# Patient Record
Sex: Male | Born: 1950
Health system: Southern US, Community
[De-identification: ages and names within clinical notes are randomized; demographics above are authoritative.]

## PROBLEM LIST (undated history)

## (undated) DIAGNOSIS — I219 Acute myocardial infarction, unspecified: Secondary | ICD-10-CM

## (undated) DIAGNOSIS — K219 Gastro-esophageal reflux disease without esophagitis: Secondary | ICD-10-CM

## (undated) DIAGNOSIS — N200 Calculus of kidney: Secondary | ICD-10-CM

## (undated) DIAGNOSIS — F419 Anxiety disorder, unspecified: Secondary | ICD-10-CM

## (undated) DIAGNOSIS — J939 Pneumothorax, unspecified: Secondary | ICD-10-CM

## (undated) DIAGNOSIS — B029 Zoster without complications: Secondary | ICD-10-CM

## (undated) DIAGNOSIS — I493 Ventricular premature depolarization: Secondary | ICD-10-CM

## (undated) DIAGNOSIS — G500 Trigeminal neuralgia: Secondary | ICD-10-CM

## (undated) DIAGNOSIS — E7800 Pure hypercholesterolemia, unspecified: Secondary | ICD-10-CM

## (undated) DIAGNOSIS — I2510 Atherosclerotic heart disease of native coronary artery without angina pectoris: Secondary | ICD-10-CM

## (undated) HISTORY — DX: Pure hypercholesterolemia, unspecified: E78.00

## (undated) HISTORY — PX: NECK SURGERY: SHX720

## (undated) HISTORY — DX: Calculus of kidney: N20.0

## (undated) HISTORY — PX: CORONARY ARTERY BYPASS GRAFT: SHX141

## (undated) HISTORY — DX: Zoster without complications: B02.9

## (undated) HISTORY — DX: Anxiety disorder, unspecified: F41.9

## (undated) HISTORY — PX: CERVICAL DISC SURGERY: SHX588

## (undated) HISTORY — DX: Atherosclerotic heart disease of native coronary artery without angina pectoris: I25.10

## (undated) HISTORY — DX: Ventricular premature depolarization: I49.3

## (undated) HISTORY — DX: Trigeminal neuralgia: G50.0

## (undated) HISTORY — DX: Acute myocardial infarction, unspecified: I21.9

## (undated) HISTORY — PX: KNEE ARTHROSCOPY: SUR90

## (undated) HISTORY — DX: Gastro-esophageal reflux disease without esophagitis: K21.9

---
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
## 1997-12-13 ENCOUNTER — Emergency Department (HOSPITAL_COMMUNITY): Admission: EM | Admit: 1997-12-13 | Discharge: 1997-12-13 | Payer: Self-pay | Admitting: Emergency Medicine

## 1998-07-28 ENCOUNTER — Emergency Department (HOSPITAL_COMMUNITY): Admission: EM | Admit: 1998-07-28 | Discharge: 1998-07-28 | Payer: Self-pay | Admitting: Emergency Medicine

## 1998-07-28 ENCOUNTER — Encounter: Payer: Self-pay | Admitting: Diagnostic Radiology

## 1999-09-17 ENCOUNTER — Ambulatory Visit (HOSPITAL_COMMUNITY): Admission: RE | Admit: 1999-09-17 | Discharge: 1999-09-17 | Payer: Self-pay | Admitting: Gastroenterology

## 1999-09-18 ENCOUNTER — Ambulatory Visit (HOSPITAL_COMMUNITY): Admission: RE | Admit: 1999-09-18 | Discharge: 1999-09-18 | Payer: Self-pay | Admitting: Gastroenterology

## 1999-09-18 ENCOUNTER — Encounter: Payer: Self-pay | Admitting: Gastroenterology

## 2000-09-17 ENCOUNTER — Inpatient Hospital Stay (HOSPITAL_COMMUNITY): Admission: EM | Admit: 2000-09-17 | Discharge: 2000-09-18 | Payer: Self-pay | Admitting: Emergency Medicine

## 2000-09-17 ENCOUNTER — Encounter: Payer: Self-pay | Admitting: Emergency Medicine

## 2000-09-18 ENCOUNTER — Encounter: Payer: Self-pay | Admitting: Cardiology

## 2003-05-05 ENCOUNTER — Observation Stay (HOSPITAL_COMMUNITY): Admission: RE | Admit: 2003-05-05 | Discharge: 2003-05-06 | Payer: Self-pay | Admitting: Neurosurgery

## 2008-10-26 LAB — HM COLONOSCOPY: HM Colonoscopy: NORMAL

## 2009-01-06 ENCOUNTER — Encounter: Admission: RE | Admit: 2009-01-06 | Discharge: 2009-01-06 | Payer: Self-pay | Admitting: Family Medicine

## 2009-06-24 DIAGNOSIS — I219 Acute myocardial infarction, unspecified: Secondary | ICD-10-CM

## 2009-06-24 HISTORY — DX: Acute myocardial infarction, unspecified: I21.9

## 2010-04-09 ENCOUNTER — Ambulatory Visit: Payer: Self-pay | Admitting: Radiology

## 2010-04-09 ENCOUNTER — Emergency Department (HOSPITAL_BASED_OUTPATIENT_CLINIC_OR_DEPARTMENT_OTHER): Admission: EM | Admit: 2010-04-09 | Discharge: 2010-04-09 | Payer: Self-pay | Admitting: Emergency Medicine

## 2010-04-09 ENCOUNTER — Encounter: Payer: Self-pay | Admitting: Internal Medicine

## 2010-04-12 ENCOUNTER — Ambulatory Visit: Payer: Self-pay | Admitting: Internal Medicine

## 2010-04-12 DIAGNOSIS — J984 Other disorders of lung: Secondary | ICD-10-CM | POA: Insufficient documentation

## 2010-04-12 DIAGNOSIS — Z8679 Personal history of other diseases of the circulatory system: Secondary | ICD-10-CM | POA: Insufficient documentation

## 2010-05-23 ENCOUNTER — Encounter: Admission: RE | Admit: 2010-05-23 | Discharge: 2010-05-23 | Payer: Self-pay | Admitting: Cardiology

## 2010-05-23 ENCOUNTER — Ambulatory Visit: Payer: Self-pay | Admitting: Cardiology

## 2010-05-24 ENCOUNTER — Inpatient Hospital Stay (HOSPITAL_COMMUNITY)
Admission: RE | Admit: 2010-05-24 | Discharge: 2010-06-02 | Payer: Self-pay | Source: Home / Self Care | Attending: Surgery | Admitting: Surgery

## 2010-05-24 ENCOUNTER — Ambulatory Visit: Payer: Self-pay | Admitting: Surgery

## 2010-05-24 ENCOUNTER — Ambulatory Visit: Payer: Self-pay | Admitting: Cardiology

## 2010-05-25 ENCOUNTER — Encounter: Payer: Self-pay | Admitting: Surgery

## 2010-06-19 ENCOUNTER — Ambulatory Visit: Payer: Self-pay | Admitting: Surgery

## 2010-06-19 ENCOUNTER — Encounter
Admission: RE | Admit: 2010-06-19 | Discharge: 2010-06-19 | Payer: Self-pay | Source: Home / Self Care | Attending: Surgery | Admitting: Surgery

## 2010-06-21 ENCOUNTER — Ambulatory Visit: Payer: Self-pay | Admitting: Cardiology

## 2010-07-02 ENCOUNTER — Ambulatory Visit
Admission: RE | Admit: 2010-07-02 | Discharge: 2010-07-02 | Payer: Self-pay | Source: Home / Self Care | Attending: Thoracic Surgery (Cardiothoracic Vascular Surgery) | Admitting: Thoracic Surgery (Cardiothoracic Vascular Surgery)

## 2010-07-05 ENCOUNTER — Encounter (HOSPITAL_COMMUNITY)
Admission: RE | Admit: 2010-07-05 | Discharge: 2010-07-24 | Payer: Self-pay | Source: Home / Self Care | Attending: Cardiology | Admitting: Cardiology

## 2010-07-09 ENCOUNTER — Ambulatory Visit
Admission: RE | Admit: 2010-07-09 | Discharge: 2010-07-09 | Payer: Self-pay | Source: Home / Self Care | Attending: Surgery | Admitting: Surgery

## 2010-07-14 ENCOUNTER — Encounter: Payer: Self-pay | Admitting: Gastroenterology

## 2010-07-24 NOTE — Assessment & Plan Note (Signed)
Summary: Pulmmonary consulatiion/ MPN > rescan in 6 months   Visit Type:  Initial Consult Copy to:  Dr. Lorre Nick Primary Provider/Referring Provider:  none  CC:  Pulmonary Nodule.  History of Present Illness: 29 yowm quit smoking 2010 no sequelae.   April 12, 2010  1st pulmonary office eval p er eval for R groin pain since 10/17 resolved on day of OV with no cough or sob.  Pt denies any significant sore throat, dysphagia, itching, sneezing,  nasal congestion or excess secretions,  fever, chills, sweats, unintended wt loss, pleuritic or exertional cp, hempoptysis, change in activity tolerance  orthopnea pnd or leg swelling.   Current Medications (verified): 1)  Zegerid Otc 20-1100 Mg Caps (Omeprazole-Sodium Bicarbonate) .Marland Kitchen.. 1 Every Am and 1 At Lunch Time 2)  Fish Oil 1000 Mg Caps (Omega-3 Fatty Acids) .Marland Kitchen.. 1 Once Daily 3)  Multivitamins  Tabs (Multiple Vitamin) .Marland Kitchen.. 1 Once Daily 4)  Aspirin 81 Mg Tbec (Aspirin) .Marland Kitchen.. 1 Once Daily  Allergies (verified): No Known Drug Allergies  Past History:  Past Medical History: Multiple pulmonary nodules LLL      - CT 12/27/08 and 101711 5.5 mm greatest  Past Surgical History: Neck surgery 2003  Family History: Lung CA- PGF- was a smoker Allergies- Brother no rheumatism   Social History: Married Children Former smoker.  Quit in 2009. Smoked for approx 30 yrs. up to 1 ppd Uses smokeless tobacco Parts salesman truck  Review of Systems       The patient complains of acid heartburn and nasal congestion/difficulty breathing through nose.  The patient denies shortness of breath with activity, shortness of breath at rest, productive cough, non-productive cough, coughing up blood, chest pain, irregular heartbeats, indigestion, loss of appetite, weight change, abdominal pain, difficulty swallowing, sore throat, tooth/dental problems, headaches, sneezing, itching, ear ache, anxiety, depression, hand/feet swelling, joint stiffness or pain,  rash, change in color of mucus, and fever.    Vital Signs:  Patient profile:   60 year old male Height:      74 inches Weight:      230.13 pounds BMI:     29.65 O2 Sat:      99 % on Room air Temp:     98.3 degrees F oral Pulse rate:   75 / minute BP sitting:   140 / 80  (left arm)  Vitals Entered By: Vernie Murders (April 12, 2010 3:10 PM)  O2 Flow:  Room air  Physical Exam  Additional Exam:  wt 230 April 12, 2010 HEENT mild turbinate edema.  Oropharynx no thrush or excess pnd or cobblestoning.  No JVD or cervical adenopathy. Mild accessory muscle hypertrophy. Trachea midline, nl thryroid. Chest was hyperinflated by percussion with diminished breath sounds and very mild  increased exp time without wheeze. Hoover sign positive at late inspiration. Regular rate and rhythm without murmur gallop or rub or increase P2 or edema.  Abd: no hsm, nl excursion. Ext warm without cyanosis or clubbing.     Impression & Recommendations:  Problem # 1:  PULMONARY NODULE (ICD-518.89)  Muliple pulmonary nodules < 8 mm therefore below the radar screen for PET,  minimally invasive bx or f/u on cxr for that matter, and old xrays won't do any good here.  Most likely they are benign; however, given the mulitple sites noted, there is no early option for surgical cure even if one of them turned out to be malignant. Therefore rec f/u CT in 6 months as per guidlelines and  place in tickle file for this purpose.   Discussed in detail all the  indications, usual  risks and alternatives  relative to the benefits with patient who agrees to proceed with conservative f/u.   Orders: Consultation Level IV (16606)  Medications Added to Medication List This Visit: 1)  Zegerid Otc 20-1100 Mg Caps (Omeprazole-sodium bicarbonate) .Marland Kitchen.. 1 every am and 1 at lunch time 2)  Fish Oil 1000 Mg Caps (Omega-3 fatty acids) .Marland Kitchen.. 1 once daily 3)  Multivitamins Tabs (Multiple vitamin) .Marland Kitchen.. 1 once daily 4)  Aspirin 81 Mg Tbec  (Aspirin) .Marland Kitchen.. 1 once daily  Patient Instructions: 1)  These nodules are all microscopic most likely completely benign but the only way to make sure is to do another scan in 6 months

## 2010-07-25 ENCOUNTER — Encounter (HOSPITAL_COMMUNITY): Payer: PRIVATE HEALTH INSURANCE | Attending: Cardiology

## 2010-07-25 DIAGNOSIS — I4901 Ventricular fibrillation: Secondary | ICD-10-CM | POA: Insufficient documentation

## 2010-07-25 DIAGNOSIS — I2510 Atherosclerotic heart disease of native coronary artery without angina pectoris: Secondary | ICD-10-CM | POA: Insufficient documentation

## 2010-07-25 DIAGNOSIS — K219 Gastro-esophageal reflux disease without esophagitis: Secondary | ICD-10-CM | POA: Insufficient documentation

## 2010-07-25 DIAGNOSIS — Z5189 Encounter for other specified aftercare: Secondary | ICD-10-CM | POA: Insufficient documentation

## 2010-07-25 DIAGNOSIS — Z951 Presence of aortocoronary bypass graft: Secondary | ICD-10-CM | POA: Insufficient documentation

## 2010-07-25 DIAGNOSIS — F17200 Nicotine dependence, unspecified, uncomplicated: Secondary | ICD-10-CM | POA: Insufficient documentation

## 2010-07-27 ENCOUNTER — Encounter (HOSPITAL_COMMUNITY): Payer: PRIVATE HEALTH INSURANCE

## 2010-07-28 ENCOUNTER — Encounter: Payer: Self-pay | Admitting: Cardiology

## 2010-07-28 DIAGNOSIS — N200 Calculus of kidney: Secondary | ICD-10-CM | POA: Insufficient documentation

## 2010-07-28 DIAGNOSIS — Z951 Presence of aortocoronary bypass graft: Secondary | ICD-10-CM | POA: Insufficient documentation

## 2010-07-28 DIAGNOSIS — I2510 Atherosclerotic heart disease of native coronary artery without angina pectoris: Secondary | ICD-10-CM

## 2010-07-28 DIAGNOSIS — K219 Gastro-esophageal reflux disease without esophagitis: Secondary | ICD-10-CM

## 2010-07-28 DIAGNOSIS — E785 Hyperlipidemia, unspecified: Secondary | ICD-10-CM | POA: Insufficient documentation

## 2010-07-28 DIAGNOSIS — E7800 Pure hypercholesterolemia, unspecified: Secondary | ICD-10-CM

## 2010-07-30 ENCOUNTER — Encounter (HOSPITAL_COMMUNITY): Payer: PRIVATE HEALTH INSURANCE

## 2010-08-01 ENCOUNTER — Encounter (HOSPITAL_COMMUNITY): Payer: PRIVATE HEALTH INSURANCE

## 2010-08-03 ENCOUNTER — Encounter (HOSPITAL_COMMUNITY): Payer: PRIVATE HEALTH INSURANCE

## 2010-08-06 ENCOUNTER — Encounter (HOSPITAL_COMMUNITY): Payer: PRIVATE HEALTH INSURANCE

## 2010-08-08 ENCOUNTER — Encounter (HOSPITAL_COMMUNITY): Payer: PRIVATE HEALTH INSURANCE

## 2010-08-10 ENCOUNTER — Encounter (HOSPITAL_COMMUNITY): Payer: PRIVATE HEALTH INSURANCE

## 2010-08-13 ENCOUNTER — Encounter (HOSPITAL_COMMUNITY): Payer: PRIVATE HEALTH INSURANCE

## 2010-08-15 ENCOUNTER — Encounter (HOSPITAL_COMMUNITY): Payer: PRIVATE HEALTH INSURANCE

## 2010-08-17 ENCOUNTER — Encounter (HOSPITAL_COMMUNITY): Payer: PRIVATE HEALTH INSURANCE

## 2010-08-20 ENCOUNTER — Encounter (HOSPITAL_COMMUNITY): Payer: PRIVATE HEALTH INSURANCE

## 2010-08-22 ENCOUNTER — Ambulatory Visit (INDEPENDENT_AMBULATORY_CARE_PROVIDER_SITE_OTHER): Payer: PRIVATE HEALTH INSURANCE | Admitting: Cardiology

## 2010-08-22 ENCOUNTER — Encounter (HOSPITAL_COMMUNITY): Payer: PRIVATE HEALTH INSURANCE

## 2010-08-22 DIAGNOSIS — E7800 Pure hypercholesterolemia, unspecified: Secondary | ICD-10-CM

## 2010-08-22 DIAGNOSIS — I2510 Atherosclerotic heart disease of native coronary artery without angina pectoris: Secondary | ICD-10-CM

## 2010-08-24 ENCOUNTER — Encounter (HOSPITAL_COMMUNITY): Payer: Self-pay

## 2010-08-27 ENCOUNTER — Encounter (HOSPITAL_COMMUNITY): Payer: Self-pay

## 2010-08-29 ENCOUNTER — Encounter (HOSPITAL_COMMUNITY): Payer: Self-pay

## 2010-08-31 ENCOUNTER — Encounter (HOSPITAL_COMMUNITY): Payer: Self-pay

## 2010-09-03 ENCOUNTER — Encounter (HOSPITAL_COMMUNITY): Payer: Self-pay

## 2010-09-03 LAB — CBC
HCT: 22 % — ABNORMAL LOW (ref 39.0–52.0)
HCT: 23.1 % — ABNORMAL LOW (ref 39.0–52.0)
HCT: 24.5 % — ABNORMAL LOW (ref 39.0–52.0)
HCT: 24.8 % — ABNORMAL LOW (ref 39.0–52.0)
HCT: 28 % — ABNORMAL LOW (ref 39.0–52.0)
HCT: 29.7 % — ABNORMAL LOW (ref 39.0–52.0)
HCT: 37.1 % — ABNORMAL LOW (ref 39.0–52.0)
HCT: 43.4 % (ref 39.0–52.0)
HCT: 45.6 % (ref 39.0–52.0)
Hemoglobin: 10.4 g/dL — ABNORMAL LOW (ref 13.0–17.0)
Hemoglobin: 13 g/dL (ref 13.0–17.0)
Hemoglobin: 14.4 g/dL (ref 13.0–17.0)
Hemoglobin: 15.5 g/dL (ref 13.0–17.0)
Hemoglobin: 7.3 g/dL — ABNORMAL LOW (ref 13.0–17.0)
Hemoglobin: 7.9 g/dL — ABNORMAL LOW (ref 13.0–17.0)
Hemoglobin: 8.1 g/dL — ABNORMAL LOW (ref 13.0–17.0)
Hemoglobin: 8.7 g/dL — ABNORMAL LOW (ref 13.0–17.0)
Hemoglobin: 9.6 g/dL — ABNORMAL LOW (ref 13.0–17.0)
MCH: 29.9 pg (ref 26.0–34.0)
MCH: 30.2 pg (ref 26.0–34.0)
MCH: 30.5 pg (ref 26.0–34.0)
MCH: 30.7 pg (ref 26.0–34.0)
MCH: 30.9 pg (ref 26.0–34.0)
MCH: 31 pg (ref 26.0–34.0)
MCH: 31.1 pg (ref 26.0–34.0)
MCH: 31.4 pg (ref 26.0–34.0)
MCH: 32 pg (ref 26.0–34.0)
MCHC: 33.1 g/dL (ref 30.0–36.0)
MCHC: 33.2 g/dL (ref 30.0–36.0)
MCHC: 33.2 g/dL (ref 30.0–36.0)
MCHC: 34 g/dL (ref 30.0–36.0)
MCHC: 34.2 g/dL (ref 30.0–36.0)
MCHC: 34.3 g/dL (ref 30.0–36.0)
MCHC: 35 g/dL (ref 30.0–36.0)
MCHC: 35 g/dL (ref 30.0–36.0)
MCHC: 35.1 g/dL (ref 30.0–36.0)
MCV: 87.1 fL (ref 78.0–100.0)
MCV: 87.2 fL (ref 78.0–100.0)
MCV: 88.3 fL (ref 78.0–100.0)
MCV: 88.6 fL (ref 78.0–100.0)
MCV: 90.2 fL (ref 78.0–100.0)
MCV: 91.4 fL (ref 78.0–100.0)
MCV: 92.4 fL (ref 78.0–100.0)
MCV: 94.2 fL (ref 78.0–100.0)
MCV: 94.8 fL (ref 78.0–100.0)
Platelets: 119 10*3/uL — ABNORMAL LOW (ref 150–400)
Platelets: 124 10*3/uL — ABNORMAL LOW (ref 150–400)
Platelets: 180 10*3/uL (ref 150–400)
Platelets: 192 10*3/uL (ref 150–400)
Platelets: 76 10*3/uL — ABNORMAL LOW (ref 150–400)
Platelets: 77 10*3/uL — ABNORMAL LOW (ref 150–400)
Platelets: 85 10*3/uL — ABNORMAL LOW (ref 150–400)
Platelets: 90 10*3/uL — ABNORMAL LOW (ref 150–400)
Platelets: 99 10*3/uL — ABNORMAL LOW (ref 150–400)
RBC: 2.38 MIL/uL — ABNORMAL LOW (ref 4.22–5.81)
RBC: 2.56 MIL/uL — ABNORMAL LOW (ref 4.22–5.81)
RBC: 2.68 MIL/uL — ABNORMAL LOW (ref 4.22–5.81)
RBC: 2.8 MIL/uL — ABNORMAL LOW (ref 4.22–5.81)
RBC: 3.21 MIL/uL — ABNORMAL LOW (ref 4.22–5.81)
RBC: 3.41 MIL/uL — ABNORMAL LOW (ref 4.22–5.81)
RBC: 4.2 MIL/uL — ABNORMAL LOW (ref 4.22–5.81)
RBC: 4.58 MIL/uL (ref 4.22–5.81)
RBC: 4.84 MIL/uL (ref 4.22–5.81)
RDW: 13.3 % (ref 11.5–15.5)
RDW: 13.7 % (ref 11.5–15.5)
RDW: 14.4 % (ref 11.5–15.5)
RDW: 14.5 % (ref 11.5–15.5)
RDW: 14.8 % (ref 11.5–15.5)
RDW: 15 % (ref 11.5–15.5)
RDW: 15.3 % (ref 11.5–15.5)
RDW: 15.3 % (ref 11.5–15.5)
RDW: 15.4 % (ref 11.5–15.5)
WBC: 11 10*3/uL — ABNORMAL HIGH (ref 4.0–10.5)
WBC: 11.2 10*3/uL — ABNORMAL HIGH (ref 4.0–10.5)
WBC: 11.2 10*3/uL — ABNORMAL HIGH (ref 4.0–10.5)
WBC: 12 10*3/uL — ABNORMAL HIGH (ref 4.0–10.5)
WBC: 15.5 10*3/uL — ABNORMAL HIGH (ref 4.0–10.5)
WBC: 4.9 10*3/uL (ref 4.0–10.5)
WBC: 5.3 10*3/uL (ref 4.0–10.5)
WBC: 7.5 10*3/uL (ref 4.0–10.5)
WBC: 9.2 10*3/uL (ref 4.0–10.5)

## 2010-09-03 LAB — POCT I-STAT 3, ART BLOOD GAS (G3+)
Acid-Base Excess: 1 mmol/L (ref 0.0–2.0)
Acid-base deficit: 1 mmol/L (ref 0.0–2.0)
Acid-base deficit: 3 mmol/L — ABNORMAL HIGH (ref 0.0–2.0)
Acid-base deficit: 5 mmol/L — ABNORMAL HIGH (ref 0.0–2.0)
Acid-base deficit: 6 mmol/L — ABNORMAL HIGH (ref 0.0–2.0)
Acid-base deficit: 7 mmol/L — ABNORMAL HIGH (ref 0.0–2.0)
Bicarbonate: 20.2 mEq/L (ref 20.0–24.0)
Bicarbonate: 22.3 mEq/L (ref 20.0–24.0)
Bicarbonate: 22.5 mEq/L (ref 20.0–24.0)
Bicarbonate: 23.9 mEq/L (ref 20.0–24.0)
Bicarbonate: 24.1 mEq/L — ABNORMAL HIGH (ref 20.0–24.0)
Bicarbonate: 25.3 mEq/L — ABNORMAL HIGH (ref 20.0–24.0)
Bicarbonate: 26.4 mEq/L — ABNORMAL HIGH (ref 20.0–24.0)
O2 Saturation: 100 %
O2 Saturation: 100 %
O2 Saturation: 100 %
O2 Saturation: 89 %
O2 Saturation: 91 %
O2 Saturation: 91 %
O2 Saturation: 96 %
Patient temperature: 36.4
Patient temperature: 37.2
Patient temperature: 37.5
TCO2: 22 mmol/L (ref 0–100)
TCO2: 24 mmol/L (ref 0–100)
TCO2: 24 mmol/L (ref 0–100)
TCO2: 25 mmol/L (ref 0–100)
TCO2: 26 mmol/L (ref 0–100)
TCO2: 27 mmol/L (ref 0–100)
TCO2: 28 mmol/L (ref 0–100)
pCO2 arterial: 43.1 mmHg (ref 35.0–45.0)
pCO2 arterial: 43.9 mmHg (ref 35.0–45.0)
pCO2 arterial: 44.1 mmHg (ref 35.0–45.0)
pCO2 arterial: 44.7 mmHg (ref 35.0–45.0)
pCO2 arterial: 47.6 mmHg — ABNORMAL HIGH (ref 35.0–45.0)
pCO2 arterial: 48.8 mmHg — ABNORMAL HIGH (ref 35.0–45.0)
pCO2 arterial: 64.7 mmHg (ref 35.0–45.0)
pH, Arterial: 7.174 — CL (ref 7.350–7.450)
pH, Arterial: 7.263 — ABNORMAL LOW (ref 7.350–7.450)
pH, Arterial: 7.266 — ABNORMAL LOW (ref 7.350–7.450)
pH, Arterial: 7.327 — ABNORMAL LOW (ref 7.350–7.450)
pH, Arterial: 7.347 — ABNORMAL LOW (ref 7.350–7.450)
pH, Arterial: 7.353 (ref 7.350–7.450)
pH, Arterial: 7.369 (ref 7.350–7.450)
pO2, Arterial: 192 mmHg — ABNORMAL HIGH (ref 80.0–100.0)
pO2, Arterial: 280 mmHg — ABNORMAL HIGH (ref 80.0–100.0)
pO2, Arterial: 335 mmHg — ABNORMAL HIGH (ref 80.0–100.0)
pO2, Arterial: 61 mmHg — ABNORMAL LOW (ref 80.0–100.0)
pO2, Arterial: 67 mmHg — ABNORMAL LOW (ref 80.0–100.0)
pO2, Arterial: 78 mmHg — ABNORMAL LOW (ref 80.0–100.0)
pO2, Arterial: 91 mmHg (ref 80.0–100.0)

## 2010-09-03 LAB — TYPE AND SCREEN
ABO/RH(D): A POS
ABO/RH(D): A POS
Antibody Screen: NEGATIVE
Antibody Screen: NEGATIVE
Unit division: 0
Unit division: 0
Unit division: 0
Unit division: 0
Unit division: 0

## 2010-09-03 LAB — POCT I-STAT, CHEM 8
BUN: 16 mg/dL (ref 6–23)
BUN: 20 mg/dL (ref 6–23)
Calcium, Ion: 1.09 mmol/L — ABNORMAL LOW (ref 1.12–1.32)
Calcium, Ion: 1.12 mmol/L (ref 1.12–1.32)
Chloride: 105 mEq/L (ref 96–112)
Chloride: 107 mEq/L (ref 96–112)
Creatinine, Ser: 0.9 mg/dL (ref 0.4–1.5)
Creatinine, Ser: 1.1 mg/dL (ref 0.4–1.5)
Glucose, Bld: 128 mg/dL — ABNORMAL HIGH (ref 70–99)
Glucose, Bld: 153 mg/dL — ABNORMAL HIGH (ref 70–99)
HCT: 29 % — ABNORMAL LOW (ref 39.0–52.0)
HCT: 32 % — ABNORMAL LOW (ref 39.0–52.0)
Hemoglobin: 10.9 g/dL — ABNORMAL LOW (ref 13.0–17.0)
Hemoglobin: 9.9 g/dL — ABNORMAL LOW (ref 13.0–17.0)
Potassium: 4.5 mEq/L (ref 3.5–5.1)
Potassium: 4.8 mEq/L (ref 3.5–5.1)
Sodium: 140 mEq/L (ref 135–145)
Sodium: 140 mEq/L (ref 135–145)
TCO2: 23 mmol/L (ref 0–100)
TCO2: 24 mmol/L (ref 0–100)

## 2010-09-03 LAB — BASIC METABOLIC PANEL
BUN: 13 mg/dL (ref 6–23)
BUN: 15 mg/dL (ref 6–23)
BUN: 17 mg/dL (ref 6–23)
BUN: 21 mg/dL (ref 6–23)
BUN: 22 mg/dL (ref 6–23)
CO2: 26 mEq/L (ref 19–32)
CO2: 28 mEq/L (ref 19–32)
CO2: 29 mEq/L (ref 19–32)
CO2: 29 mEq/L (ref 19–32)
CO2: 33 mEq/L — ABNORMAL HIGH (ref 19–32)
Calcium: 7.8 mg/dL — ABNORMAL LOW (ref 8.4–10.5)
Calcium: 8 mg/dL — ABNORMAL LOW (ref 8.4–10.5)
Calcium: 8.4 mg/dL (ref 8.4–10.5)
Calcium: 9 mg/dL (ref 8.4–10.5)
Calcium: 9.3 mg/dL (ref 8.4–10.5)
Chloride: 100 mEq/L (ref 96–112)
Chloride: 102 mEq/L (ref 96–112)
Chloride: 103 mEq/L (ref 96–112)
Chloride: 105 mEq/L (ref 96–112)
Chloride: 110 mEq/L (ref 96–112)
Creatinine, Ser: 1.01 mg/dL (ref 0.4–1.5)
Creatinine, Ser: 1.02 mg/dL (ref 0.4–1.5)
Creatinine, Ser: 1.07 mg/dL (ref 0.4–1.5)
Creatinine, Ser: 1.1 mg/dL (ref 0.4–1.5)
Creatinine, Ser: 1.16 mg/dL (ref 0.4–1.5)
GFR calc Af Amer: >60 mL/min (ref >60)
GFR calc Af Amer: >60 mL/min (ref >60)
GFR calc Af Amer: >60 mL/min (ref >60)
GFR calc Af Amer: >60 mL/min (ref >60)
GFR calc Af Amer: >60 mL/min (ref >60)
GFR calc non Af Amer: >60 mL/min (ref >60)
GFR calc non Af Amer: >60 mL/min (ref >60)
GFR calc non Af Amer: >60 mL/min (ref >60)
GFR calc non Af Amer: >60 mL/min (ref >60)
GFR calc non Af Amer: >60 mL/min (ref >60)
Glucose, Bld: 109 mg/dL — ABNORMAL HIGH (ref 70–99)
Glucose, Bld: 128 mg/dL — ABNORMAL HIGH (ref 70–99)
Glucose, Bld: 144 mg/dL — ABNORMAL HIGH (ref 70–99)
Glucose, Bld: 86 mg/dL (ref 70–99)
Glucose, Bld: 94 mg/dL (ref 70–99)
Potassium: 4.1 mEq/L (ref 3.5–5.1)
Potassium: 4.2 mEq/L (ref 3.5–5.1)
Potassium: 4.3 mEq/L (ref 3.5–5.1)
Potassium: 4.4 mEq/L (ref 3.5–5.1)
Potassium: 4.9 mEq/L (ref 3.5–5.1)
Sodium: 134 mEq/L — ABNORMAL LOW (ref 135–145)
Sodium: 137 mEq/L (ref 135–145)
Sodium: 139 mEq/L (ref 135–145)
Sodium: 140 mEq/L (ref 135–145)
Sodium: 140 mEq/L (ref 135–145)

## 2010-09-03 LAB — GLUCOSE, CAPILLARY
Glucose-Capillary: 108 mg/dL — ABNORMAL HIGH (ref 70–99)
Glucose-Capillary: 113 mg/dL — ABNORMAL HIGH (ref 70–99)
Glucose-Capillary: 121 mg/dL — ABNORMAL HIGH (ref 70–99)
Glucose-Capillary: 121 mg/dL — ABNORMAL HIGH (ref 70–99)
Glucose-Capillary: 127 mg/dL — ABNORMAL HIGH (ref 70–99)
Glucose-Capillary: 133 mg/dL — ABNORMAL HIGH (ref 70–99)
Glucose-Capillary: 135 mg/dL — ABNORMAL HIGH (ref 70–99)
Glucose-Capillary: 135 mg/dL — ABNORMAL HIGH (ref 70–99)
Glucose-Capillary: 150 mg/dL — ABNORMAL HIGH (ref 70–99)
Glucose-Capillary: 163 mg/dL — ABNORMAL HIGH (ref 70–99)
Glucose-Capillary: 83 mg/dL (ref 70–99)
Glucose-Capillary: 92 mg/dL (ref 70–99)

## 2010-09-03 LAB — POCT I-STAT 4, (NA,K, GLUC, HGB,HCT)
Glucose, Bld: 114 mg/dL — ABNORMAL HIGH (ref 70–99)
Glucose, Bld: 117 mg/dL — ABNORMAL HIGH (ref 70–99)
Glucose, Bld: 136 mg/dL — ABNORMAL HIGH (ref 70–99)
Glucose, Bld: 143 mg/dL — ABNORMAL HIGH (ref 70–99)
Glucose, Bld: 182 mg/dL — ABNORMAL HIGH (ref 70–99)
Glucose, Bld: 193 mg/dL — ABNORMAL HIGH (ref 70–99)
Glucose, Bld: 195 mg/dL — ABNORMAL HIGH (ref 70–99)
Glucose, Bld: 251 mg/dL — ABNORMAL HIGH (ref 70–99)
HCT: 21 % — ABNORMAL LOW (ref 39.0–52.0)
HCT: 25 % — ABNORMAL LOW (ref 39.0–52.0)
HCT: 25 % — ABNORMAL LOW (ref 39.0–52.0)
HCT: 25 % — ABNORMAL LOW (ref 39.0–52.0)
HCT: 26 % — ABNORMAL LOW (ref 39.0–52.0)
HCT: 36 % — ABNORMAL LOW (ref 39.0–52.0)
HCT: 37 % — ABNORMAL LOW (ref 39.0–52.0)
HCT: 45 % (ref 39.0–52.0)
Hemoglobin: 12.2 g/dL — ABNORMAL LOW (ref 13.0–17.0)
Hemoglobin: 12.6 g/dL — ABNORMAL LOW (ref 13.0–17.0)
Hemoglobin: 15.3 g/dL (ref 13.0–17.0)
Hemoglobin: 7.1 g/dL — ABNORMAL LOW (ref 13.0–17.0)
Hemoglobin: 8.5 g/dL — ABNORMAL LOW (ref 13.0–17.0)
Hemoglobin: 8.5 g/dL — ABNORMAL LOW (ref 13.0–17.0)
Hemoglobin: 8.5 g/dL — ABNORMAL LOW (ref 13.0–17.0)
Hemoglobin: 8.8 g/dL — ABNORMAL LOW (ref 13.0–17.0)
Potassium: 2.8 mEq/L — ABNORMAL LOW (ref 3.5–5.1)
Potassium: 3.4 mEq/L — ABNORMAL LOW (ref 3.5–5.1)
Potassium: 3.8 mEq/L (ref 3.5–5.1)
Potassium: 4.3 mEq/L (ref 3.5–5.1)
Potassium: 4.5 mEq/L (ref 3.5–5.1)
Potassium: 4.5 mEq/L (ref 3.5–5.1)
Potassium: 4.8 mEq/L (ref 3.5–5.1)
Potassium: 5 mEq/L (ref 3.5–5.1)
Sodium: 133 mEq/L — ABNORMAL LOW (ref 135–145)
Sodium: 134 mEq/L — ABNORMAL LOW (ref 135–145)
Sodium: 136 mEq/L (ref 135–145)
Sodium: 136 mEq/L (ref 135–145)
Sodium: 138 mEq/L (ref 135–145)
Sodium: 140 mEq/L (ref 135–145)
Sodium: 141 mEq/L (ref 135–145)
Sodium: 142 mEq/L (ref 135–145)

## 2010-09-03 LAB — CREATININE, SERUM
Creatinine, Ser: 1.09 mg/dL (ref 0.4–1.5)
Creatinine, Ser: 1.12 mg/dL (ref 0.4–1.5)
GFR calc Af Amer: >60 mL/min (ref >60)
GFR calc Af Amer: >60 mL/min (ref >60)
GFR calc non Af Amer: >60 mL/min (ref >60)
GFR calc non Af Amer: >60 mL/min (ref >60)

## 2010-09-03 LAB — PROTIME-INR
INR: 1.83 — ABNORMAL HIGH (ref 0.00–1.49)
Prothrombin Time: 21.3 seconds — ABNORMAL HIGH (ref 11.6–15.2)

## 2010-09-03 LAB — MAGNESIUM
Magnesium: 2.2 mg/dL (ref 1.5–2.5)
Magnesium: 2.3 mg/dL (ref 1.5–2.5)
Magnesium: 2.8 mg/dL — ABNORMAL HIGH (ref 1.5–2.5)

## 2010-09-03 LAB — PREPARE RBC (CROSSMATCH)

## 2010-09-03 LAB — MRSA PCR SCREENING: MRSA by PCR: NEGATIVE

## 2010-09-03 LAB — PLATELET COUNT: Platelets: 93 10*3/uL — ABNORMAL LOW (ref 150–400)

## 2010-09-03 LAB — PREPARE PLATELETS: Unit division: 0

## 2010-09-03 LAB — HEMOGLOBIN AND HEMATOCRIT, BLOOD
HCT: 25.1 % — ABNORMAL LOW (ref 39.0–52.0)
Hemoglobin: 8.7 g/dL — ABNORMAL LOW (ref 13.0–17.0)

## 2010-09-03 LAB — APTT: aPTT: 47 seconds — ABNORMAL HIGH (ref 24–37)

## 2010-09-03 LAB — ABO/RH: ABO/RH(D): A POS

## 2010-09-05 ENCOUNTER — Encounter (HOSPITAL_COMMUNITY): Payer: Self-pay

## 2010-09-05 LAB — CBC
HCT: 43 % (ref 39.0–52.0)
Hemoglobin: 14.4 g/dL (ref 13.0–17.0)
MCH: 31.9 pg (ref 26.0–34.0)
MCHC: 33.6 g/dL (ref 30.0–36.0)
MCV: 94.9 fL (ref 78.0–100.0)
Platelets: 186 10*3/uL (ref 150–400)
RBC: 4.53 MIL/uL (ref 4.22–5.81)
RDW: 13 % (ref 11.5–15.5)
WBC: 5.1 10*3/uL (ref 4.0–10.5)

## 2010-09-05 LAB — URINALYSIS, ROUTINE W REFLEX MICROSCOPIC
Bilirubin Urine: NEGATIVE
Glucose, UA: NEGATIVE mg/dL
Hgb urine dipstick: NEGATIVE
Ketones, ur: NEGATIVE mg/dL
Nitrite: NEGATIVE
Protein, ur: NEGATIVE mg/dL
Specific Gravity, Urine: 1.024 (ref 1.005–1.030)
Urobilinogen, UA: 0.2 mg/dL (ref 0.0–1.0)
pH: 7 (ref 5.0–8.0)

## 2010-09-05 LAB — DIFFERENTIAL
Basophils Absolute: 0 10*3/uL (ref 0.0–0.1)
Basophils Relative: 0 % (ref 0–1)
Eosinophils Absolute: 0.1 10*3/uL (ref 0.0–0.7)
Eosinophils Relative: 2 % (ref 0–5)
Lymphocytes Relative: 39 % (ref 12–46)
Lymphs Abs: 2 10*3/uL (ref 0.7–4.0)
Monocytes Absolute: 0.5 10*3/uL (ref 0.1–1.0)
Monocytes Relative: 10 % (ref 3–12)
Neutro Abs: 2.5 10*3/uL (ref 1.7–7.7)
Neutrophils Relative %: 49 % (ref 43–77)

## 2010-09-05 LAB — URINE CULTURE
Colony Count: NO GROWTH
Culture  Setup Time: 201110171611
Culture: NO GROWTH

## 2010-09-05 LAB — BASIC METABOLIC PANEL
BUN: 16 mg/dL (ref 6–23)
CO2: 30 mEq/L (ref 19–32)
Calcium: 9.5 mg/dL (ref 8.4–10.5)
Chloride: 103 mEq/L (ref 96–112)
Creatinine, Ser: 1.1 mg/dL (ref 0.4–1.5)
GFR calc Af Amer: >60 mL/min (ref >60)
GFR calc non Af Amer: >60 mL/min (ref >60)
Glucose, Bld: 91 mg/dL (ref 70–99)
Potassium: 4.3 mEq/L (ref 3.5–5.1)
Sodium: 141 mEq/L (ref 135–145)

## 2010-09-07 ENCOUNTER — Encounter (HOSPITAL_COMMUNITY): Payer: Self-pay

## 2010-09-10 ENCOUNTER — Encounter (HOSPITAL_COMMUNITY): Payer: Self-pay

## 2010-09-12 ENCOUNTER — Encounter (HOSPITAL_COMMUNITY): Payer: Self-pay

## 2010-09-14 ENCOUNTER — Encounter (HOSPITAL_COMMUNITY): Payer: Self-pay

## 2010-09-17 ENCOUNTER — Encounter (HOSPITAL_COMMUNITY): Payer: Self-pay

## 2010-09-19 ENCOUNTER — Encounter (HOSPITAL_COMMUNITY): Payer: Self-pay

## 2010-09-21 ENCOUNTER — Encounter (HOSPITAL_COMMUNITY): Payer: Self-pay

## 2010-09-24 ENCOUNTER — Encounter (HOSPITAL_COMMUNITY): Payer: Self-pay

## 2010-09-26 ENCOUNTER — Encounter (HOSPITAL_COMMUNITY): Payer: Self-pay

## 2010-09-28 ENCOUNTER — Encounter (HOSPITAL_COMMUNITY): Payer: Self-pay

## 2010-10-01 ENCOUNTER — Encounter (HOSPITAL_COMMUNITY): Payer: Self-pay

## 2010-10-03 ENCOUNTER — Encounter (HOSPITAL_COMMUNITY): Payer: Self-pay

## 2010-10-05 ENCOUNTER — Encounter (HOSPITAL_COMMUNITY): Payer: Self-pay

## 2010-10-08 ENCOUNTER — Encounter (HOSPITAL_COMMUNITY): Payer: Self-pay

## 2010-10-10 ENCOUNTER — Encounter (HOSPITAL_COMMUNITY): Payer: Self-pay

## 2010-10-12 ENCOUNTER — Encounter (HOSPITAL_COMMUNITY): Payer: Self-pay

## 2010-10-15 ENCOUNTER — Encounter (HOSPITAL_COMMUNITY): Payer: Self-pay

## 2010-11-06 NOTE — Assessment & Plan Note (Signed)
OFFICE VISIT   Willie Black, Willie Black  DOB:  [birth year]                                        July 02, 2010  CHART #:  [id number]   HISTORY:  The patient comes in today for check of his [facility] site.  He was  in to see Dr. Laneta Simmers on June 19, 2010, for a follow up status post  coronary artery bypass grafting on May 28, 2010.  At that followup  check, he was doing well and was discharged from our care.  Since he saw  Dr. Laneta Simmers, he has had increasing discomfort in the right lower  extremity EVH tunnel.  Over the past 3 days, the pain has been  significant and he has been swelling in his thigh.  He denies any  fevers, chills, or drainage from the area.  He has seen Dr. Swaziland since  his visit with Dr. Laneta Simmers and everything was going well from a  cardiology standpoint.  He had no medication changes at that visit.   PHYSICAL EXAMINATION:  VITAL SIGNS:  Blood pressure is 124/78, pulse is  82, respirations 16, O2 sat 98%, temperature 98 degrees.  Extremities:  His right lower extremity EVH incision has healed well without erythema.  The right thigh shows some swelling and tenderness mid thigh along the  EVH tunnel site with some mild erythema.  The area is very tender to  touch, although it is soft.  Otherwise, his sternal incision has healed  well.  Heart and Lung:  Within normal limits.   ASSESSMENT AND PLAN:  The patient appears to have a small hematoma in  the right endoscopic vessel harvesting tunnel which appears to have  become infected.  We will start him on Keflex 500 mg t.i.d. x1 week and  see him back in 1 week to recheck the area.  He may call in the interim  if he experiences any problems or has any further questions.   Coral Ceo, P.A.   GC/MEDQ  D:  07/02/2010  T:  07/03/2010  Job:  [id number]   cc:   Peter M. Swaziland, M.D.

## 2010-11-06 NOTE — Assessment & Plan Note (Signed)
OFFICE VISIT   Willie Black, Willie Black  DOB:  [birth year]                                        June 19, 2010  CHART #:  [id number]   HISTORY:  The patient returned to my office today for followup status  post coronary artery bypass graft surgery on May 28, 2010.  His  procedure was complicated by ventricular fibrillation and cardiac arrest  shortly after induction of anesthesia requiring CPR and emergent  surgery.  His postoperative course was uncomplicated.  Since discharge,  he has been feeling fairly well and is walking daily without chest pain  or shortness of breath.   PHYSICAL EXAMINATION:  Blood pressure is 96/63, his pulse is 75 and  regular, and respiratory rate is 16 and unlabored.  Oxygen saturation on  room air is 98%.  He looks well.  Cardiac exam shows regular rate and  rhythm with normal heart sounds.  His lung exam is clear.  Chest  incision is healing well and the sternum is stable.  His leg incision is  healing well and there is no peripheral edema.   DIAGNOSTIC TESTS:  Followup chest x-ray shows clear lung fields and no  pleural effusions.   MEDICATIONS:  1. Xanax 0.25 mg q.8 h. p.r.n.  2. Lopressor 12.5 mg b.i.d.  3. Oxycodone p.r.n. for pain.  4. Aspirin 325 mg daily.  5. Crestor 20 mg nightly.  6. Calcium plus vitamin D daily.  7. Fish oil 1 g daily.  8. Vitamin D plus C 1 g daily.  9. Zegerid 1 daily.  10.Ultram p.r.n. for pain.   IMPRESSION:  Overall, the patient is making a good recovery following  his surgery.  I encouraged him to continue walking as much as possible.  I stressed the importance of stopping smoking.  He said he has not  smoked since discharge and will do his best to remain off cigarettes.  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  He will continue to follow up with Dr. Peter  Swaziland  and will contact me if he develops any problems with his incisions.   Evelene Croon, M.D.  Electronically Signed   BB/MEDQ  D:  06/19/2010  T:  06/20/2010  Job:  [id number]   cc:   Peter M. [place], M.D.

## 2010-11-06 NOTE — Assessment & Plan Note (Signed)
OFFICE VISIT   Willie Black, Willie Black  DOB:  [birth year]                                        July 09, 2010  CHART #:  [id number]   HISTORY:  The patient is a 60 year old gentleman who underwent coronary  artery bypass grafting x3 on May 28, 2010, for severe two-vessel  coronary artery disease.  He was last seen in the office on July 02, 2010, at which time, he had some difficulty associated with his right  lower extremity endoscopic vein harvesting tunnel that included evidence  of hematoma and at that time it appeared to have become infected.  He  was started on a course of Keflex 500 mg t.i.d. for 1 week and scheduled  for today's visit.  Currently, he denies any fevers, chills, sweats, or  other constitutional symptoms.  He states that the thigh feels better.  There is no associated heat or erythema.  Overall, he feels like he is  making good progress and he is anxious to start cardiac rehabilitation  formally.   PHYSICAL EXAMINATION:  Vital Signs:  Blood pressure is 116/72, pulse 80,  respirations 14, oxygen saturation is 99% on room air, and temperature  is 98.3.  General Appearance:  This a well-developed adult male, in no  acute distress.  Extremities:  The right lower extremity is examined.  There is some mild induration associated with the tunnel tract.  There  is no erythema, no warmth, no significant swelling.  The incisions  themselves are healing well without evidence of infection.   ASSESSMENT:  The patient appears to be recovering well from his  endoscopic vein harvest site tunnel infection/hematoma.  There is some  ongoing inflammatory process associated with healing.  He does not  appear to have any evidence of infection at this time and we will not  restart  antibiotics.  We will see him again on a p.r.n. basis.  He is okay to  start cardiac rehabilitation program at any time from our viewpoint.   Rowe Clack, P.A.-C.   Sherryll Burger  D:  07/09/2010  T:  07/10/2010  Job:  [id number]   cc:   Peter M. [place], M.D.

## 2010-11-09 NOTE — Op Note (Signed)
NAME:  Willie Black, Willie Black                    ACCOUNT NO.:  [id number]   MEDICAL RECORD NO.:  [id number]                   PATIENT TYPE:  INP   LOCATION:  3172                                 FACILITY:  [facility]   PHYSICIAN:  Danae Orleans. Venetia Maxon, M.D.               DATE OF BIRTH:  September 25, 1950   DATE OF PROCEDURE:  05/05/2003  DATE OF DISCHARGE:                                 OPERATIVE REPORT   PREOPERATIVE DIAGNOSIS:  Herniated cervical disk with cervical spondylosis,  degenerative disk disease and cervical radiculopathy, C5-6 and C6-7 levels.   POSTOPERATIVE DIAGNOSIS:  Herniated cervical disk with cervical spondylosis,  degenerative disk disease and cervical radiculopathy, C5-6 and C6-7 levels.   OPERATION PERFORMED:  Anterior cervical decompression and fusion C5-6 and C6-  7 levels with allograft, bone graft and anterior cervical plate.   SURGEON:  Danae Orleans. Venetia Maxon, M.D.   ASSISTANT:  Hewitt Shorts, M.D.   ANESTHESIA:  General endotracheal.   ESTIMATED BLOOD LOSS:  Minimal.   COMPLICATIONS:  None.   DISPOSITION:  Recovery.   INDICATIONS FOR PROCEDURE:  Willie Black is a 60 year old man with  neck and bilateral upper extremity pain, left greater than right.  He has  significant spondylosis at C5-6 and C6-7 levels.  It was elected to take him  to surgery for anterior cervical decompression and fusion at these affected  levels.   DESCRIPTION OF PROCEDURE:  Willie Black was brought to the operating room.  Following satisfactory and uncomplicated induction of general endotracheal  anesthesia and placement of intravenous lines, the patient was placed in  supine position on the operating table.  His neck was placed in slight  extension and he was placed in 10 pounds of halter traction. His anterior  neck was then prepped and draped in the usual sterile fashion.  The area of  planned incision was infiltrated with 0.25% Marcaine and 0.5% lidocaine  1:200,000  epinephrine.  Incision was made in the midline to the anterior  border of the sternocleidomastoid muscle, carried sharply through the  platysma layer.  Subplatysmal dissection was performed identifying the  anterior border of the sternocleidomastoid muscle. Using blunt dissection,  the carotid sheath was kept lateral, the trachea and esophagus kept medial  exposing the anterior cervical spine.  A bent spinal needle was placed at  what was felt to be the C5-6 level and intraoperative x-ray confirmed this  to be at the C5-6 level.  Subsequently, the longus colli muscles were taken  down from the anterior cervical spine from C5 through C7 levels using  electrocautery and Key elevator.  Shadowline self-retaining retractor was  placed to facilitate exposure along with __________ retractor. Ventral  osteophytes were removed with a Leksell rongeur at the C6-7 and C5-6 levels.  The disk space was incised at each of these levels.  Disk material was  removed in piecemeal fashion using a variety of Carlens curets.  The end  plates were stripped of residual disk material.  Subsequently disk space  spreaders were placed initially at C6-7 and subsequently at the C5-6 level.  More gross diskectomy was then performed.  Subsequently with the disk space  spreader at the C6-7 level, the microscope was brought into the field and  using high powered microscopic visualization and A2 equivalent bur the end  plates of C6 and C7 were decorticated and markedly hypertrophied uncinate  spurs were drilled down.  The posterior longitudinal ligament was then  incised with an arachnoid knife and was removed in piecemeal fashion  resulting in decompression of central spinal cord dura and both C6-7 neural  foramina.  The C7 nerve roots were felt to be well decompressed.  Hemostasis  was assured with Gelfoam soaked in Thrombin.  After using a trial sizer, an  8 mm corticocancellous bone graft was inserted into the interspace  and  countersunk appropriately.  Attention was then turned to the C5-6 level and  similar decompression was performed.  Again, a similarly sized bone graft  was placed.  Both the C6 nerve roots were felt to be well decompressed.  Subsequently, a 42mm Trinica anterior cervical plate was then affixed to the  anterior cervical spine using 14 mm variable angle screws, two at C5, two at  C6, and two at C7 levels.  All the locking mechanisms were engaged.  The  screws had excellent purchase.  The wound was then copiously irrigated with  bacitracin saline.  Soft tissues were inspected and found to be in good  repair.  A final x-ray confirmed positioning of bone graft and anterior  cervical plate.  The wound was closed with 3-0 Vicryl sutures in the  platysmal layer and subcuticular layer of 4-0 Vicryl subcuticular stitch.  The wound was dressed with Dermabond.  The patient was extubated in the  operating room and taken to the recovery room in stable and satisfactory  condition having tolerated the operation well.  Counts were correct at the  end of the case.                                               Danae Orleans. Venetia Maxon, M.D.    JDS/MEDQ  D:  05/05/2003  T:  05/05/2003  Job:  [id number]

## 2010-11-09 NOTE — Discharge Summary (Signed)
[place]. [facility]  Patient:    Willie Black, Willie Black                 MRN: [id number] Adm. Date:  [id number] Disc. Date: [id number] Attending:  Rudean Hitt CC:         Darden Palmer., M.D.  John C. Madilyn Fireman, M.D.   Discharge Summary  FINAL DIAGNOSIS:  Chest pain, myocardial infarction ruled out.  OPERATIONS PERFORMED:  Treadmill Cardiolite stress test by Dr. Donnie Aho which was negative for ischemia and negative electrocardiographically.  HOSPITAL COURSE:  This 60 year old gentleman was admitted with left arm pain and near syncope and questionable chest pain.  He has had a previously negative stress test by Dr. Donnie Aho in November 2001.  He sees Dr. Dorena Cookey for hiatal hernia and has been on Nexium.  He was admitted as a rule-out MI on September 17, 2000.  Electrocardiogram on admission was negative for ischemia.  On the morning after admission, with negative cardiac enzymes, he underwent a treadmill Cardiolite stress test.  This was negative for ischemia and he was able to be discharged home on that same day.  It was felt that his arm pain was not from his heart but might be secondary to cervical disk disease.  It was felt that his near syncope was probably a vasovagal reaction to pain and he also has a known hiatal hernia.  He was discharged improved on Nexium one daily and he will follow up with his regular doctor.  CONDITION ON DISCHARGE:  Improved. DD:  10/05/00 TD:  10/05/00 Job: [id number]

## 2010-11-09 NOTE — H&P (Signed)
[place]. [facility]  Patient:    Willie Black, Willie Black                 MRN: [id number] Adm. Date:  [id number] Attending:  Doug Sou CC:         Darden Palmer., M.D.  John C. Madilyn Fireman, M.D.   History and Physical  CHIEF COMPLAINT:  Left arm pain and near syncope.  HISTORY:  This 60 year old gentleman was admitted through the emergency room after presenting with left arm pain and questionable chest pain and associated dyspnea.  He was dizzy and near syncope and had nausea.  He came to the emergency room by ambulance.  There was no definite relief of pain after being given sublingual nitroglycerin and aspirin.  Of note is the fact that his past history reveals a history of atypical chest pain and he had a Bruce treadmill stress test on May 20, 2000 by Dr. Donnie Aho which was negative at that time.  He has been followed by Dr. Dorena Cookey with a known hiatal hernia and has been doing well on daily Nexium.  His left arm pain was not worse with movement of the arm or shoulder.  There was no associated tenderness to palpation.  The arm pain was described as a burning discomfort.  FAMILY HISTORY:  Negative for premature coronary disease.  His father died at age 25 of pneumonia and had terminal heart failure.  The patients mother has a history of hypertension.  He has a brother with kidney stones.  SOCIAL HISTORY:  He is married and has a daughter and a son who are healthy. 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  He plays golf.  He was a former three-fourths of a pack of cigarette per day smoker for over 25 years, but quit after seeing Dr. Donnie Aho in November 2001.  Patient drinks very little alcohol and he drinks small amount of caffeine.  REVIEW OF SYSTEMS:  He has had mid sternal chest discomfort in the past which has been attributed to his indigestion from the reflux and hiatal hernia. Years ago he did have a history of  hospitalization for tachycardia and this has not recurred.  Review of systems is negative except for the present illness.  He has not been having any genitourinary symptoms.  He denies any respiratory complaints.  PHYSICAL EXAMINATION  VITAL SIGNS:  Blood pressure 135/88, pulse 65 and regular, respirations are normal.  GENERAL:  He is a well-developed, well-nourished gentleman in no distress at the present time.  He does continue to complain of some burning left biceps pain.  He denies chest pain at this time.  Color is good.  SKIN:  Warm and dry.  NECK:  Jugular venous pressure is normal.  Carotids are normal without bruits.  LUNGS:  Clear to percussion and auscultation.  HEART:  No murmur, gallop, or rub.  ABDOMEN:  Soft and nontender.  EXTREMITIES:  No tenderness to the left arm.  Pedal pulses are good.  No edema.  LABORATORIES:  Electrocardiogram shows sinus bradycardia and is otherwise within normal limits.  Chest x-ray shows COPD.  IMPRESSION: 1. Left arm pain with nausea and presyncope, rule out myocardial infarction. 2. History of hiatal hernia. 3. Prior cigarette abuse. 4. Possible cervical spondylitis causing left arm pain.  DISPOSITION:  Admit for rule out MI.  Serial EKGs and enzymes.  Lovenox.  Will plan for a treadmill Cardiolite stress test in the a.m. by Dr.  Donnie Aho. DD:  09/17/00 TD:  09/17/00 Job: [id number]

## 2010-11-22 ENCOUNTER — Emergency Department (HOSPITAL_COMMUNITY): Payer: PRIVATE HEALTH INSURANCE

## 2010-11-22 ENCOUNTER — Observation Stay (HOSPITAL_COMMUNITY)
Admission: EM | Admit: 2010-11-22 | Discharge: 2010-11-23 | DRG: 313 | Disposition: A | Discharge status: Home / Self Care | Payer: PRIVATE HEALTH INSURANCE | Source: Ambulatory Visit | Attending: Cardiology | Admitting: Cardiology

## 2010-11-22 DIAGNOSIS — Z79899 Other long term (current) drug therapy: Secondary | ICD-10-CM | POA: Insufficient documentation

## 2010-11-22 DIAGNOSIS — Z87442 Personal history of urinary calculi: Secondary | ICD-10-CM | POA: Insufficient documentation

## 2010-11-22 DIAGNOSIS — R0789 Other chest pain: Principal | ICD-10-CM | POA: Diagnosis present

## 2010-11-22 DIAGNOSIS — Z981 Arthrodesis status: Secondary | ICD-10-CM

## 2010-11-22 DIAGNOSIS — I252 Old myocardial infarction: Secondary | ICD-10-CM | POA: Insufficient documentation

## 2010-11-22 DIAGNOSIS — I2510 Atherosclerotic heart disease of native coronary artery without angina pectoris: Secondary | ICD-10-CM | POA: Diagnosis present

## 2010-11-22 DIAGNOSIS — Z7982 Long term (current) use of aspirin: Secondary | ICD-10-CM

## 2010-11-22 DIAGNOSIS — N200 Calculus of kidney: Secondary | ICD-10-CM | POA: Diagnosis present

## 2010-11-22 DIAGNOSIS — M47812 Spondylosis without myelopathy or radiculopathy, cervical region: Secondary | ICD-10-CM | POA: Diagnosis present

## 2010-11-22 DIAGNOSIS — I10 Essential (primary) hypertension: Secondary | ICD-10-CM | POA: Insufficient documentation

## 2010-11-22 DIAGNOSIS — K219 Gastro-esophageal reflux disease without esophagitis: Secondary | ICD-10-CM | POA: Diagnosis present

## 2010-11-22 DIAGNOSIS — Z87891 Personal history of nicotine dependence: Secondary | ICD-10-CM

## 2010-11-22 DIAGNOSIS — R079 Chest pain, unspecified: Secondary | ICD-10-CM

## 2010-11-22 DIAGNOSIS — Z951 Presence of aortocoronary bypass graft: Secondary | ICD-10-CM

## 2010-11-22 LAB — CARDIAC PANEL(CRET KIN+CKTOT+MB+TROPI)
CK, MB: 0.9 ng/mL (ref 0.3–4.0)
Relative Index: INVALID (ref 0.0–2.5)
Total CK: 40 U/L (ref 7–232)
Troponin I: <0.3 ng/mL (ref <0.30)

## 2010-11-22 LAB — CK TOTAL AND CKMB (NOT AT ARMC)
CK, MB: 1 ng/mL (ref 0.3–4.0)
Relative Index: INVALID (ref 0.0–2.5)
Total CK: 60 U/L (ref 7–232)

## 2010-11-22 LAB — BASIC METABOLIC PANEL
BUN: 14 mg/dL (ref 6–23)
CO2: 30 mEq/L (ref 19–32)
Calcium: 9.2 mg/dL (ref 8.4–10.5)
Chloride: 99 mEq/L (ref 96–112)
Creatinine, Ser: 0.93 mg/dL (ref 0.4–1.5)
GFR calc Af Amer: >60 mL/min (ref >60)
GFR calc non Af Amer: >60 mL/min (ref >60)
Glucose, Bld: 87 mg/dL (ref 70–99)
Potassium: 4.2 mEq/L (ref 3.5–5.1)
Sodium: 136 mEq/L (ref 135–145)

## 2010-11-22 LAB — URINALYSIS, ROUTINE W REFLEX MICROSCOPIC
Bilirubin Urine: NEGATIVE
Glucose, UA: NEGATIVE mg/dL
Hgb urine dipstick: NEGATIVE
Ketones, ur: NEGATIVE mg/dL
Nitrite: NEGATIVE
Protein, ur: NEGATIVE mg/dL
Specific Gravity, Urine: 1.01 (ref 1.005–1.030)
Urobilinogen, UA: 0.2 mg/dL (ref 0.0–1.0)
pH: 7.5 (ref 5.0–8.0)

## 2010-11-22 LAB — SEDIMENTATION RATE: Sed Rate: 28 mm/hr — ABNORMAL HIGH (ref 0–16)

## 2010-11-22 LAB — APTT: aPTT: 33 seconds (ref 24–37)

## 2010-11-22 LAB — CBC
HCT: 40.6 % (ref 39.0–52.0)
Hemoglobin: 13.5 g/dL (ref 13.0–17.0)
MCH: 30.1 pg (ref 26.0–34.0)
MCHC: 33.3 g/dL (ref 30.0–36.0)
MCV: 90.6 fL (ref 78.0–100.0)
Platelets: 201 10*3/uL (ref 150–400)
RBC: 4.48 MIL/uL (ref 4.22–5.81)
RDW: 13.6 % (ref 11.5–15.5)
WBC: 8.5 10*3/uL (ref 4.0–10.5)

## 2010-11-22 LAB — DIFFERENTIAL
Basophils Absolute: 0.1 10*3/uL (ref 0.0–0.1)
Basophils Relative: 1 % (ref 0–1)
Eosinophils Absolute: 0.1 10*3/uL (ref 0.0–0.7)
Eosinophils Relative: 1 % (ref 0–5)
Lymphocytes Relative: 11 % — ABNORMAL LOW (ref 12–46)
Lymphs Abs: 0.9 10*3/uL (ref 0.7–4.0)
Monocytes Absolute: 0.9 10*3/uL (ref 0.1–1.0)
Monocytes Relative: 10 % (ref 3–12)
Neutro Abs: 6.6 10*3/uL (ref 1.7–7.7)
Neutrophils Relative %: 78 % — ABNORMAL HIGH (ref 43–77)

## 2010-11-22 LAB — D-DIMER, QUANTITATIVE: D-Dimer, Quant: 1.43 ug/mL-FEU — ABNORMAL HIGH (ref 0.00–0.48)

## 2010-11-22 LAB — TROPONIN I: Troponin I: <0.3 ng/mL (ref <0.30)

## 2010-11-22 LAB — PROTIME-INR
INR: 0.97 (ref 0.00–1.49)
Prothrombin Time: 13.1 seconds (ref 11.6–15.2)

## 2010-11-22 MED ORDER — IOHEXOL 300 MG/ML  SOLN
100.0000 mL | Freq: Once | INTRAMUSCULAR | Status: AC | PRN
  Administered 2010-11-22: 100 mL via INTRAVENOUS

## 2010-11-23 LAB — CBC
HCT: 37.7 % — ABNORMAL LOW (ref 39.0–52.0)
Hemoglobin: 12.5 g/dL — ABNORMAL LOW (ref 13.0–17.0)
MCH: 29.8 pg (ref 26.0–34.0)
MCHC: 33.2 g/dL (ref 30.0–36.0)
MCV: 90 fL (ref 78.0–100.0)
Platelets: 196 10*3/uL (ref 150–400)
RBC: 4.19 MIL/uL — ABNORMAL LOW (ref 4.22–5.81)
RDW: 13.8 % (ref 11.5–15.5)
WBC: 6.3 10*3/uL (ref 4.0–10.5)

## 2010-11-23 LAB — CARDIAC PANEL(CRET KIN+CKTOT+MB+TROPI)
CK, MB: 0.6 ng/mL (ref 0.3–4.0)
Relative Index: INVALID (ref 0.0–2.5)
Total CK: 38 U/L (ref 7–232)
Troponin I: <0.3 ng/mL (ref <0.30)

## 2010-12-04 ENCOUNTER — Encounter: Payer: Self-pay | Admitting: Cardiology

## 2010-12-17 NOTE — Discharge Summary (Signed)
NAME:  Willie Black, Willie Black NO.:  [id number]  MEDICAL RECORD NO.:  [id number]           PATIENT TYPE:  I  LOCATION:  3711                         FACILITY:  [facility]  PHYSICIAN:  Peter M. Swaziland, M.D.  DATE OF BIRTH:  [birth year]  DATE OF ADMISSION:  11/22/2010 DATE OF DISCHARGE:  11/23/2010                              DISCHARGE SUMMARY   PRIMARY CARDIOLOGIST:  Peter M. Swaziland, MD.  DISCHARGE DIAGNOSIS:  Musculoskeletal chest pain exacerbated by physical activity.  SECONDARY DIAGNOSES: 1. Coronary artery disease status post coronary artery bypass grafting     x3 in December 2011. 2. Cervical spine osteoarthritis status post cervical disk surgery. 3. Gastroesophageal reflux disease.  ALLERGIES:  No known drug allergies.  PROCEDURES/DIAGNOSTICS PERFORMED DURING HOSPITALIZATION: 1. Cervical spine x-ray on Nov 22, 2010:  No acute findings.  There is     mild bilateral facet DJD at C5-6.  Noted old anterior cervical     spine fusion from C5-T7. 2. CT angiography of the chest with contrast on Nov 22, 2010:  No     acute aortic abnormality of pulmonary embolus.  Cardiomegaly and     coronary artery bypass grafting.  Emphysema with scattered     pulmonary nodules.  Probable nondisplaced bilateral 9th rib     fracture. 3. Chest x-ray on Nov 22, 2010:  Stable exam with no active     cardiopulmonary disease process.  REASON FOR HOSPITALIZATION:  This is a 60 year old gentleman with history of coronary artery disease status post bypass graft in December 2011, who has been doing well from a heart standpoint until approximately 6 days ago when he was pushing and riding a lawnmower up the hill.  Since that time, he has had anterior chest pain of the upper chest, radiating to the right shoulder.  The pain is aggravated by deep rest as well as leaning forward.  Pain has been constant for the past 6 days, but with increased intensity.  The patient presented to the emergency  department for further evaluation.  In the emergency room, the patient remained with chest pain.  EKG showed normal sinus rhythm without acute changes.  Vital signs stable.  In the emergency department, the patient's D-dimer was elevated to 1.43.  He underwent CT of the chest that ruled out for pulmonary embolus as well as no pneumonia.  It was noted that the patient had scattered pulmonary nodules and will need a follow-up noncontrast chest CT.  Cardiology admitted the patient for further evaluation.  HOSPITAL COURSE:  The patient was admitted to the telemetry unit.  The patient ruled out for myocardial infarction with cardiac enzymes being negative x3.  With the patient's history of cervical spine fusion, a cervical spine x-ray was obtained that showed no acute findings with mild bilateral facet degenerative joint disease at C5-C6.  EKG was without acute changes on day of discharge.  The patient still remained with soreness around his upper chest and right shoulder with deep inspiration.  Dr. Swaziland evaluated the patient on day of discharge and noted him stable for home.  It was felt the patient's chest pain was musculoskeletal, exacerbated by  his physical activity, with possible aggravation of old cervical injury.  The patient will be discharged in stable condition and take Aleve for the next 2 weeks as well as his tramadol.  Discharge plans and instructions were discussed with the patient and he voiced understanding.  DISCHARGE LABS:  Cardiac enzymes negative x3.  WBC 6.3, hemoglobin 12.5, hematocrit 37.7, platelet 196.  DISCHARGE MEDICATIONS: 1. Aspirin enteric-coated 81 mg one tablet daily. 2. Calcium over-the-counter one tablet daily. 3. Crestor 20 mg half tablet daily. 4. Metoprolol tartrate 25 mg half tablet twice daily. 5. Tramadol 50 mg one tablet three times a day as needed for pain. 6. Vitamin B over-the-counter one tablet daily. 7. Vitamin D over-the-counter one tablet  daily. 8. Zegerid 20 mg 2 capsules daily.  FOLLOWUP PLANS AND INSTRUCTIONS: 1. The patient will follow up with Dr. Swaziland as previously scheduled     or sooner if need be. 2. The patient should increase activity slowly. 3. The patient should continue low-sodium heart-healthy diet. 4. The patient should avoid straining. 5. The patient should call the office for any problems or concerns.  DURATION OF DISCHARGE:  Greater than 30 minutes with physician and physician extender time.     [name], PA-C   ______________________________ Peter M. Swaziland, M.D.    NB/MEDQ  D:  11/23/2010  T:  11/23/2010  Job:  [id number]  cc:   Peter M. [place], M.D.  Electronically Signed by Alen Blew P.A. on 12/16/2010 04:53:45 PM Electronically Signed by PETER Swaziland M.D. on 12/17/2010 09:19:42 AM

## 2011-01-10 NOTE — H&P (Signed)
NAME:  MARQUEE, FUCHS NO.:  [id number]  MEDICAL RECORD NO.:  [id number]           PATIENT TYPE:  I  LOCATION:  3711                         FACILITY:  [facility]  PHYSICIAN:  Marca Ancona, MD      DATE OF BIRTH:  1950/10/06  DATE OF ADMISSION:  11/22/2010 DATE OF DISCHARGE:                             HISTORY & PHYSICAL   PRIMARY CARDIOLOGIST:  Peter M. [place], MD  CHIEF COMPLAINT:  Chest pain.  PAST MEDICAL HISTORY: 1. Coronary artery disease:  Unstable angina with coronary artery     bypass grafting in December 2011.     a.     Procedure was completed by ventricular fibrillation and      cardiac arrest shortly after induction of anesthesia requiring      cardiopulmonary resuscitation and emergent surgery.     b.     Left internal mammary artery to the mid left anterior      descending, saphenous vein graft to distal left anterior      descending, saphenous vein graft to obtuse marginal. 2. Cervical spine osteoarthritis status post cervical disk surgeries. 3. Gastroesophageal reflux disease. 4. Nephrolithiasis.  HISTORY OF PRESENT ILLNESS:  This is a 60 year old gentleman with history of coronary artery bypass grafting in December 2011 who has been doing well from a cardiac standpoint since that time.  Denies any chest pain until approximately 6 days ago.  At that time, the patient states that he was pushing a riding lawn mower up a hill.  Later that day, he began to have a anterior chest pain.  The pain is across the front of his chest and radiates up to his right shoulder.  The pain is worse with inspiration as well as leaning forward.  Chest pain has remained constant over the past 6 days.  He has no other associated symptoms of shortness of breath, diaphoresis, nausea, or vomiting.  The pain is at a minimum if the patient takes short quit breaths.  He felt the pain was improving until yesterday and with the pain with deep inspiration they brought him  into the emergency department for further evaluation.  Of note, the patient does have to lift heavy supplies at work and is relatively active at home and has not had this pain before.  In the emergency department, initial EKG showed normal sinus rhythm with a right axis deviation but no acute ST-T-wave changes.  A D-dimer was elevated at 1.43 with subsequent CT angio of the chest showing no acute pulmonary embolus or aortic abnormalities.  There was a probable nondisplaced right lateral ninth rib fracture noted.  Cardiac enzymes are currently pending.  The patient has been given pain medication in the emergency room with some relief but not complete resolution. Cardiology was asked to evaluate the patient further.  SOCIAL HISTORY:  The patient lives at home with his wife.  He has two children.  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  He has a history of tobacco abuse but reports quitting 2 years ago.  He denies alcohol abuse.  FAMILY HISTORY:  His mother is alive at age of 11  and healthy.  His father did have coronary artery disease with stents placed in his 15s, he has since passed away at age of 49 from an abdominal aneurysm.  ALLERGIES:  No known drug allergies.  HOME MEDICATIONS: 1. Lopressor 12.5 mg twice daily. 2. Aspirin 325 mg daily. 3. Crestor 10 mg daily. 4. Zegerid 20 mg 2 capsules daily.  REVIEW OF SYSTEMS:  All pertinent positives and negatives as stated in HPI as well as her and questionable chills during the evening and all night.  All other systems have been reviewed and are negative.  PHYSICAL EXAMINATION:  VITAL SIGNS:  Temperature 100, pulse 85, respirations 17, blood pressure 106/70, O2 saturation 97% on room air. GENERAL:  This is a well-developed, well-nourished middle-aged gentleman.  He is no acute distress. HEENT:  Normal. NECK:  Supple without JVD. HEART:  Regular rate and rhythm with S1 and S2.  No murmur, rub, or gallop noted.  PMI is normal.   Pulses 2+ and equal bilaterally. LUNGS:  Clear to auscultation bilaterally without wheezes, rales, or rhonchi. ABDOMEN:  Soft, nontender, positive bowel sounds x4. EXTREMITIES:  No clubbing, cyanosis, or edema. MUSCULOSKELETAL:  No joint deformities or effusions. NEUROLOGIC:  Alert and oriented x3, cranial nerves II through XII grossly intact.  Chest x-ray demonstrating stable exam with no active cardiopulmonary disease process.  CT angio of the chest showing no acute aortic abnormality or pulmonary embolus.  This did demonstrate cardiomegaly and history of coronary artery bypass grafting.  There was emphysema with scattered pulmonary nodules.  He was recommended for a noncontrast chest CT in 6-12 months for followup.  There is probable nondisplaced right lateral ninth rib fracture.  EKG showing normal sinus rhythm at a rate of 94 beats per minute.  There is right axis deviation.  No acute ST-T- wave changes.  LABORATORY DATA:  WBC of 8.5, hemoglobin 13.5, hematocrit 40.6, platelet 201.  Sodium 136, potassium 4.2, chloride 99, bicarb 30, BUN 14, creatinine 0.93.  D-dimer 1.43.  INR is 0.97.  ASSESSMENT AND PLAN:  This is a 60 year old Caucasian gentleman with history of coronary artery disease status post coronary artery bypass grafting who presents with pain across his upper chest.  The pain is pleuritic and has been present since Saturday, 6 days ago.  Pain did begin after pushing a riding lawn mower up a hill.  We suspect his pain is musculoskeletal related to overexertion.  It is less likely that this is pericarditis and even more less likely that this is an ischemic pain. He has ruled out for pulmonary embolus, and pneumonia.  At this time, the patient will be admitted to telemetry and ruled out for myocardial infarction.  Initial cardiac enzymes are currently pending.  If the patient rules out for myocardial infarction, he can be discharged in the morning without further  ischemic workup.  At this time, we will check a sedimentation rate for further workup of pericarditis and treat his chest pain with narcotics.  Further treatment will be dependent upon the above results.  The patient has also had a cervical disk surgery and states that the pain is somewhat similar to pain prior to surgery, therefore we will also check a C-spine x-ray to ensure this was not the etiology.     [name], PA-C   ______________________________ Marca Ancona, MD    NB/MEDQ  D:  11/22/2010  T:  11/23/2010  Job:  [id number]  Electronically Signed by Alen Blew P.A. on 12/16/2010 04:53:39 PM  Electronically Signed by Marca Ancona MD on 01/10/2011 01:28:53 PM

## 2011-03-01 ENCOUNTER — Other Ambulatory Visit: Payer: Self-pay | Admitting: Cardiology

## 2011-03-04 ENCOUNTER — Other Ambulatory Visit: Payer: Self-pay | Admitting: Cardiology

## 2011-03-04 NOTE — Telephone Encounter (Signed)
Refilled Meds from fax  

## 2011-03-04 NOTE — Telephone Encounter (Signed)
Wants RN to call in his Metoprolol 25mg  to [facility] on [address].

## 2011-03-19 ENCOUNTER — Encounter: Payer: Self-pay | Admitting: Cardiology

## 2011-03-19 ENCOUNTER — Ambulatory Visit (INDEPENDENT_AMBULATORY_CARE_PROVIDER_SITE_OTHER): Payer: PRIVATE HEALTH INSURANCE | Admitting: Cardiology

## 2011-03-19 VITALS — BP 118/76 | HR 64 | Ht 74.0 in | Wt 233.2 lb

## 2011-03-19 DIAGNOSIS — E7800 Pure hypercholesterolemia, unspecified: Secondary | ICD-10-CM

## 2011-03-19 DIAGNOSIS — I2510 Atherosclerotic heart disease of native coronary artery without angina pectoris: Secondary | ICD-10-CM

## 2011-03-19 NOTE — Progress Notes (Signed)
Willie Black Date of Birth: 11/23/1950   History of Present Illness: [name] is seen today for followup. He has a history of coronary disease and is status post coronary bypass surgery in December of 2011. This included an LIMA graft to the LAD, saphenous vein graft to the obtuse marginal vessel. He had severe left main disease. As he was coming off pump he had ST elevation anteriorly and so a vein graft was also placed to the LAD. He continues to do very well from a cardiac standpoint without any symptoms of chest pain, shortness of breath, or palpitations. He still has some sternal soreness from his bypass surgery.  Current Outpatient Prescriptions on File Prior to Visit  Medication Sig Dispense Refill  . b complex vitamins tablet Take 1 tablet by mouth daily.        . metoprolol tartrate (LOPRESSOR) 25 MG tablet TAKE 1/2 TABLET BY MOUTH TWICE A DAY  30 tablet  5  . Multiple Vitamin (MULTIVITAMIN) tablet Take 1 tablet by mouth daily.        Marland Kitchen omeprazole-sodium bicarbonate (ZEGERID) 40-1100 MG per capsule Take 1 capsule by mouth daily.        . rosuvastatin (CRESTOR) 20 MG tablet Take 10 mg by mouth daily.         No Known Allergies  Past Medical History  Diagnosis Date  . GERD (gastroesophageal reflux disease)   . Renal calculi   . Hypercholesteremia   . CAD (coronary artery disease)     Past Surgical History  Procedure Date  . Coronary artery bypass graft 05/28/2110    x3  . Cervical disc surgery     History  Smoking status  . Former Smoker  . Types: Cigarettes  . Quit date: 04/27/2009  Smokeless tobacco  . Not on file    History  Alcohol Use No    Family History  Problem Relation Age of Onset  . Aneurysm Father     abdominal  . Coronary artery disease Father     3 stents    Review of Systems: The review of systems is positive for resolution of his previous gastric problems on medication.  As a result he has gained 13 pounds. He has had some  recent sinus problems. He is concerned about his family history of abdominal aortic aneurysms. It was recommended that he have an ultrasound at age 60. All other systems were reviewed and are negative.  Physical Exam: BP 118/76  Pulse 64  Ht 6\' 2"  (1.88 m)  Wt 233 lb 3.2 oz (105.779 kg)  BMI 29.94 kg/m2 The patient is alert and oriented x 3.  The mood and affect are normal.  The skin is warm and dry.  Color is normal.  The HEENT exam reveals that the sclera are nonicteric.  The mucous membranes are moist.  The carotids are 2+ without bruits.  There is no thyromegaly.  There is no JVD.  The lungs are clear.  The chest wall reveals a median sternotomy scar. There is some tenderness on his right superior parasternal margin.  The heart exam reveals a regular rate with a normal S1 and S2.  There are no murmurs, gallops, or rubs.  The PMI is not displaced.   Abdominal exam reveals good bowel sounds.  There is no guarding or rebound.  There is no hepatosplenomegaly or tenderness.  There are no masses.  Exam of the legs reveal no clubbing, cyanosis, or edema.  The legs  are without rashes.  The distal pulses are intact.  Cranial nerves II - XII are intact.  Motor and sensory functions are intact.  The gait is normal.  LABORATORY DATA:   Assessment / Plan:

## 2011-03-19 NOTE — Patient Instructions (Signed)
We will schedule you for fasting lab work.  Continue your current medications.  Watch your weight.  I will see you again in 6 months.

## 2011-03-19 NOTE — Assessment & Plan Note (Signed)
We will schedule him for fasting lab work to check his chemistries and lipid panel.

## 2011-03-19 NOTE — Assessment & Plan Note (Signed)
Status post CABG in December of 2011 for severe left main disease. He is asymptomatic. We will continue with aspirin therapy and lipid-lowering therapy. Continue with metoprolol.

## 2011-03-28 ENCOUNTER — Other Ambulatory Visit (INDEPENDENT_AMBULATORY_CARE_PROVIDER_SITE_OTHER): Payer: PRIVATE HEALTH INSURANCE | Admitting: *Deleted

## 2011-03-28 DIAGNOSIS — E7800 Pure hypercholesterolemia, unspecified: Secondary | ICD-10-CM

## 2011-03-28 DIAGNOSIS — I2510 Atherosclerotic heart disease of native coronary artery without angina pectoris: Secondary | ICD-10-CM

## 2011-03-28 LAB — HEPATIC FUNCTION PANEL
ALT: 19 U/L (ref 0–53)
AST: 24 U/L (ref 0–37)
Albumin: 4.3 g/dL (ref 3.5–5.2)
Alkaline Phosphatase: 70 U/L (ref 39–117)
Bilirubin, Direct: 0 mg/dL (ref 0.0–0.3)
Total Bilirubin: 0.6 mg/dL (ref 0.3–1.2)
Total Protein: 7.3 g/dL (ref 6.0–8.3)

## 2011-03-28 LAB — BASIC METABOLIC PANEL
BUN: 15 mg/dL (ref 6–23)
CO2: 29 mEq/L (ref 19–32)
Calcium: 9.1 mg/dL (ref 8.4–10.5)
Chloride: 105 mEq/L (ref 96–112)
Creatinine, Ser: 1.2 mg/dL (ref 0.4–1.5)
GFR: 65.82 mL/min (ref >60.00)
Glucose, Bld: 99 mg/dL (ref 70–99)
Potassium: 4.5 mEq/L (ref 3.5–5.1)
Sodium: 140 mEq/L (ref 135–145)

## 2011-03-28 LAB — LIPID PANEL
Cholesterol: 99 mg/dL (ref 0–200)
HDL: 45.6 mg/dL (ref >39.00)
LDL Cholesterol: 44 mg/dL (ref 0–99)
Total CHOL/HDL Ratio: 2
Triglycerides: 45 mg/dL (ref 0.0–149.0)
VLDL: 9 mg/dL (ref 0.0–40.0)

## 2011-04-01 ENCOUNTER — Telehealth: Payer: Self-pay | Admitting: *Deleted

## 2011-04-01 NOTE — Telephone Encounter (Signed)
Notified of lab results. 

## 2011-04-01 NOTE — Telephone Encounter (Signed)
Message copied by Lorayne Bender on Mon Apr 01, 2011 12:06 PM ------      Message from: Sw[place], PETER M      Created: Thu Mar 28, 2011  5:12 PM       Chemistries are normal. Lipids look great.      Theron Arista [place]

## 2011-05-08 ENCOUNTER — Ambulatory Visit (INDEPENDENT_AMBULATORY_CARE_PROVIDER_SITE_OTHER): Payer: PRIVATE HEALTH INSURANCE | Admitting: Nurse Practitioner

## 2011-05-08 ENCOUNTER — Encounter: Payer: Self-pay | Admitting: Nurse Practitioner

## 2011-05-08 VITALS — BP 120/90 | HR 76 | Ht 74.0 in | Wt 233.4 lb

## 2011-05-08 DIAGNOSIS — I2510 Atherosclerotic heart disease of native coronary artery without angina pectoris: Secondary | ICD-10-CM

## 2011-05-08 DIAGNOSIS — I4949 Other premature depolarization: Secondary | ICD-10-CM

## 2011-05-08 DIAGNOSIS — I493 Ventricular premature depolarization: Secondary | ICD-10-CM | POA: Insufficient documentation

## 2011-05-08 DIAGNOSIS — R002 Palpitations: Secondary | ICD-10-CM

## 2011-05-08 LAB — BASIC METABOLIC PANEL
BUN: 16 mg/dL (ref 6–23)
CO2: 27 mEq/L (ref 19–32)
Calcium: 9.3 mg/dL (ref 8.4–10.5)
Chloride: 105 mEq/L (ref 96–112)
Creatinine, Ser: 1 mg/dL (ref 0.4–1.5)
GFR: 78.48 mL/min (ref >60.00)
Glucose, Bld: 82 mg/dL (ref 70–99)
Potassium: 4.1 mEq/L (ref 3.5–5.1)
Sodium: 141 mEq/L (ref 135–145)

## 2011-05-08 LAB — TSH: TSH: 1 u[IU]/mL (ref 0.35–5.50)

## 2011-05-08 MED ORDER — METOPROLOL TARTRATE 25 MG PO TABS: 25.0000 mg | ORAL_TABLET | Freq: Two times a day (BID) | ORAL | Status: DC

## 2011-05-08 NOTE — Assessment & Plan Note (Signed)
Has had prior CABG almost one year ago. No current chest pain.

## 2011-05-08 NOTE — Progress Notes (Signed)
Willie Black Date of Birth: February 04, 1951 Medical Record #[id number]  History of Present Illness: Willie Black is seen today for a work in visit. He is seen for Dr. Sw[place]iland. He is complaining of headaches and palpitations. His palpitations started on Monday night after he laid down to go to sleep. He noticed "hard beating" and skipping. He got very anxious. He went to [facility] yesterday. Was noted to have sinus infection and was given antibiotics and Flonase. He has been using Afrin for over one year on a regular basis. No chest pain. He had recurrent palpitations last night. Says it makes his head feel funny. He has been taking a lot of Ibuprofen for chronic back pain. He does use a fair amount of caffeine. No alcohol use.   Current Outpatient Prescriptions on File Prior to Visit  Medication Sig Dispense Refill  . aspirin 81 MG tablet Take 81 mg by mouth daily.        Marland Kitchen b complex vitamins tablet Take 1 tablet by mouth daily.        . Calcium Carbonate-Vitamin D (CALCIUM + D PO) Take by mouth daily.        Marland Kitchen omeprazole-sodium bicarbonate (ZEGERID) 40-1100 MG per capsule Take 1 capsule by mouth daily.        . rosuvastatin (CRESTOR) 20 MG tablet Take 10 mg by mouth daily.       Marland Kitchen DISCONTD: metoprolol tartrate (LOPRESSOR) 25 MG tablet TAKE 1/2 TABLET BY MOUTH TWICE A DAY  30 tablet  5    No Known Allergies  Past Medical History  Diagnosis Date  . GERD (gastroesophageal reflux disease)   . Renal calculi   . Hypercholesteremia   . CAD (coronary artery disease)     S/P prior CABG  x 3 in December 2011  . PVC (premature ventricular contraction)   . Anxiety     Past Surgical History  Procedure Date  . Coronary artery bypass graft 05/28/2110    x3 with LIMA to LAD, SVG to OM and SVG to LAD after having ST elevation with coming off pump  . Cervical disc surgery     History  Smoking status  . Former Smoker  . Types: Cigarettes  . Quit date: 04/27/2009  Smokeless tobacco  .  Not on file    History  Alcohol Use No    Family History  Problem Relation Age of Onset  . Aneurysm Father     abdominal  . Coronary artery disease Father     3 stents    Review of Systems: The review of systems is positive for chronic back pain. No chest pain. Not really exercising on a routine basis. Remains anxious.  All other systems were reviewed and are negative.  Physical Exam: BP 120/90  Pulse 76  Ht 6\' 2"  (1.88 m)  Wt 233 lb 6.4 oz (105.87 kg)  BMI 29.97 kg/m2 Patient is very anxious but in no acute distress. Skin is warm and dry. Color is normal.  HEENT is unremarkable. Normocephalic/atraumatic. PERRL. Sclera are nonicteric. Neck is supple. No masses. No JVD. Lungs are clear. Cardiac exam shows a regular rate and rhythm. He does have frequent ectopics. Abdomen is soft. Extremities are without edema. Gait and ROM are intact. No gross neurologic deficits noted.  LABORATORY DATA: EKG with rhythm strips show sinus with frequent PVC's. Tracings from [facility] are reviewed. He did have some inferior Q's but not on our tracing today.   Assessment /  Plan:

## 2011-05-08 NOTE — Assessment & Plan Note (Addendum)
He has PVC's noted on EKG. I think this is what he is feeling. I have increased his Lopressor to a whole tablet BID. We will check potassium and TSH today. I will see him in a week. He needs to cut back his caffeine. He is already going to stop the Ibuprofen and Afrin. He will take his antibiotics for the sinus infection as already prescribed. Patient is agreeable to this plan and will call if any problems develop in the interim.

## 2011-05-08 NOTE — Patient Instructions (Signed)
We are going to recheck your EKG today.  I want you to increase your metoprolol to a whole tablet two times a day  Minimize your caffeine and stop the Afrin and the Ibuprofen  Take your antibiotics as prescribed.   I will see you in a week.   Call the [facility] office at [phone] if you have any questions, problems or concerns.

## 2011-05-10 ENCOUNTER — Telehealth: Payer: Self-pay | Admitting: Cardiology

## 2011-05-10 NOTE — Telephone Encounter (Signed)
Yesterday until 6:00pm he felt fine.

## 2011-05-10 NOTE — Telephone Encounter (Signed)
Mr Youngblood states that last night at work around 6:00pm he started feeling lightheaded and his heart fluttering.  These problems didn't subside until 10:00pm.  Today he feels sleepy and weak.   He states he changed his meds as directed, made lifestyle changes and cut back to one cup of coffee daily and no other caffeine products.  He isn't sure if this is normal or not after cutting out caffeine.

## 2011-05-10 NOTE — Telephone Encounter (Signed)
SPOKE WITH PT  FEELS FINE AT THIS TIME  HAS MADE A LOT OF LIFESTYLE CHANGES IN ADDITION TO MED CHANGE WILL CONT TO MONITOR IF EXPERIENCES ANOTHER EPISODE OF LIGHTHEADEDNESS OR PALPITATIONS WILL CALL OFFICE  PER LORI MAY NEED  HOLTER.PT AWARE .Willie Black

## 2011-05-10 NOTE — Telephone Encounter (Signed)
New problem:  Patient stating no feeling well since the last time he was here to see PA . Medication was changed by PA.

## 2011-05-10 NOTE — Telephone Encounter (Signed)
He is on the whole tablet of Lopressor? This may make him feel tired. If has recurrent spell, needs Holter.

## 2011-05-13 ENCOUNTER — Telehealth: Payer: Self-pay | Admitting: Cardiology

## 2011-05-13 DIAGNOSIS — R002 Palpitations: Secondary | ICD-10-CM

## 2011-05-13 NOTE — Telephone Encounter (Signed)
Patient was called stated he woke up this morning with heart fluttering .States makes him nervous .Has stopped his caffiene,chocolate.ibuprofen since last Tuesday,and metoprolol was increased to 25mg s.to twice daily.Spoke with [employer] event monitor ordered.

## 2011-05-13 NOTE — Telephone Encounter (Signed)
Pt was told to call if his palpitations didn't get better and it is not and his b/p is going up and down and he needs to know if he needs to get a monitor or not

## 2011-05-13 NOTE — Telephone Encounter (Signed)
Left message on patients answering machine he needs a event monitor.He will be getting a call to get event monitor scheduled.

## 2011-05-13 NOTE — Telephone Encounter (Signed)
New message:  Pt states he has called x 2 times this am.  He is having fluttering, irregular HB, since Tuesday.  He feels no better after changing his medication.

## 2011-05-15 ENCOUNTER — Encounter: Payer: Self-pay | Admitting: Nurse Practitioner

## 2011-05-15 ENCOUNTER — Ambulatory Visit (INDEPENDENT_AMBULATORY_CARE_PROVIDER_SITE_OTHER): Payer: PRIVATE HEALTH INSURANCE | Admitting: Nurse Practitioner

## 2011-05-15 VITALS — BP 140/90 | HR 58 | Ht 74.0 in | Wt 233.0 lb

## 2011-05-15 DIAGNOSIS — I10 Essential (primary) hypertension: Secondary | ICD-10-CM | POA: Insufficient documentation

## 2011-05-15 DIAGNOSIS — I493 Ventricular premature depolarization: Secondary | ICD-10-CM

## 2011-05-15 DIAGNOSIS — R030 Elevated blood-pressure reading, without diagnosis of hypertension: Secondary | ICD-10-CM

## 2011-05-15 DIAGNOSIS — I4949 Other premature depolarization: Secondary | ICD-10-CM

## 2011-05-15 DIAGNOSIS — IMO0001 Reserved for inherently not codable concepts without codable children: Secondary | ICD-10-CM

## 2011-05-15 NOTE — Assessment & Plan Note (Signed)
We will go ahead and place the event monitor just to rule out any other issues. He is very agreeable. He will continue with his current medicines. We will see him back in a month to review. Patient is agreeable to this plan and will call if any problems develop in the interim.

## 2011-05-15 NOTE — Assessment & Plan Note (Signed)
I have asked him to check some readings at home and bring in for review at his return appointment.

## 2011-05-15 NOTE — Progress Notes (Signed)
Willie Black Date of Birth: Mar 06, 1951 Medical Record #[id number]  History of Present Illness: Willie Black is seen today for a follow up visit. He is seen for Dr. Sw[place]il[place]. I saw him last week with complaints of palpitations. His EKG showed PVC's. I increased his Lopressor. We did not initially put an event monitor on due to documentation of the PVC's on the EKG and rhythm strip. He continued to have issues with the palpitations. He called multiple times. He says he is doing better now as of Monday afternoon. He is actually taking a half of his Lopressor four times a day in order to tolerate it better. Blood pressure is up here today. He does not check at home.   Current Outpatient Prescriptions on File Prior to Visit  Medication Sig Dispense Refill  . amoxicillin (AMOXIL) 875 MG tablet Take 875 mg by mouth 2 (two) times daily.        Marland Kitchen aspirin 81 MG tablet Take 81 mg by mouth daily.        Marland Kitchen b complex vitamins tablet Take 1 tablet by mouth daily.        . Calcium Carbonate-Vitamin D (CALCIUM + D PO) Take by mouth daily.        . fluticasone (FLONASE) 50 MCG/ACT nasal spray Place 2 sprays into the nose daily.        . metoprolol tartrate (LOPRESSOR) 25 MG tablet Take 1 tablet (25 mg total) by mouth 2 (two) times daily.  30 tablet  5  . omeprazole-sodium bicarbonate (ZEGERID) 40-1100 MG per capsule Take 1 capsule by mouth daily.        . rosuvastatin (CRESTOR) 20 MG tablet Take 10 mg by mouth daily.         No Known Allergies  Past Medical History  Diagnosis Date  . GERD (gastroesophageal reflux disease)   . Renal calculi   . Hypercholesteremia   . CAD (coronary artery disease)     S/P prior CABG  x 3 in December 2011  . PVC (premature ventricular contraction)   . Anxiety     Past Surgical History  Procedure Date  . Coronary artery bypass graft 05/28/2110    x3 with LIMA to LAD, SVG to OM and SVG to LAD after having ST elevation with coming off pump  . Cervical disc  surgery     History  Smoking status  . Former Smoker  . Types: Cigarettes  . Quit date: 04/27/2009  Smokeless tobacco  . Not on file    History  Alcohol Use No    Family History  Problem Relation Age of Onset  . Aneurysm Father     abdominal  . Coronary artery disease Father     3 stents    Review of Systems: The review of systems is positive for generalized aches and pains. Not dizzy or lightheaded. Does get upset easily.  All other systems were reviewed and are negative.  Physical Exam: BP 140/106  Pulse 58  Ht 6\' 2"  (1.88 m)  Wt 233 lb (105.688 kg)  BMI 29.92 kg/m2 Patient is alert and in no acute distress. He remains somewhat anxious.  Skin is warm and dry. Color is normal.  HEENT is unremarkable. Normocephalic/atraumatic. PERRL. Sclera are nonicteric. Neck is supple. No masses. No JVD. Lungs are clear. Cardiac exam shows a regular rate and rhythm. Abdomen is soft. Extremities are without edema. Gait and ROM are intact. No gross neurologic deficits noted.   LABORATORY  DATA: EKG shows sinus bradycardia today. No PVC's noted.  Assessment / Plan:

## 2011-05-15 NOTE — Patient Instructions (Signed)
Try to monitor your blood pressure at home. Your goal is less than 135/85  We are going to put this heart monitor on for the next month.  I will see you in a month.  Continue with your current medicines.  Call the [facility] office at [phone] if you have any questions, problems or concerns.

## 2011-05-20 ENCOUNTER — Telehealth: Payer: Self-pay | Admitting: Cardiology

## 2011-05-20 DIAGNOSIS — R002 Palpitations: Secondary | ICD-10-CM

## 2011-05-20 MED ORDER — METOPROLOL TARTRATE 25 MG PO TABS: 25.0000 mg | ORAL_TABLET | Freq: Two times a day (BID) | ORAL | Status: DC

## 2011-05-20 NOTE — Telephone Encounter (Addendum)
New problem Pt said he saw lori and she changed his metoprolol rx from 1/2 tablet to whole tablet twice a day and he will run out of pills he needs new rx  [facility] [address] [phone] please let him know when done

## 2011-05-20 NOTE — Telephone Encounter (Signed)
Pt notified that [facility] [address]. Metoprolol 25mg   1 (one) tablet 2x's daily.

## 2011-06-14 ENCOUNTER — Ambulatory Visit (INDEPENDENT_AMBULATORY_CARE_PROVIDER_SITE_OTHER): Payer: PRIVATE HEALTH INSURANCE | Admitting: Nurse Practitioner

## 2011-06-14 ENCOUNTER — Encounter: Payer: Self-pay | Admitting: Nurse Practitioner

## 2011-06-14 DIAGNOSIS — E7800 Pure hypercholesterolemia, unspecified: Secondary | ICD-10-CM

## 2011-06-14 DIAGNOSIS — R030 Elevated blood-pressure reading, without diagnosis of hypertension: Secondary | ICD-10-CM

## 2011-06-14 DIAGNOSIS — I4949 Other premature depolarization: Secondary | ICD-10-CM

## 2011-06-14 DIAGNOSIS — I493 Ventricular premature depolarization: Secondary | ICD-10-CM

## 2011-06-14 DIAGNOSIS — IMO0001 Reserved for inherently not codable concepts without codable children: Secondary | ICD-10-CM

## 2011-06-14 DIAGNOSIS — I2510 Atherosclerotic heart disease of native coronary artery without angina pectoris: Secondary | ICD-10-CM

## 2011-06-14 NOTE — Assessment & Plan Note (Signed)
Blood pressure diary from home shows his readings to be very satisfactory.

## 2011-06-14 NOTE — Assessment & Plan Note (Addendum)
Asymptomatic. He is one year out from his bypass surgery.

## 2011-06-14 NOTE — Assessment & Plan Note (Signed)
He is doing well. Feels good on his beta blocker. Has no awareness of his PVC's. We will continue with his current medicines. See him back in 4 months. Patient is agreeable to this plan and will call if any problems develop in the interim.

## 2011-06-14 NOTE — Assessment & Plan Note (Signed)
He is having some leg pains. Distal pulses are 2+. He will hold his Crestor for 2 weeks. If he improves, he will call and let us know so we can change his medicines. If he sees no change, he will resume his Crestor.

## 2011-06-14 NOTE — Patient Instructions (Addendum)
Stay on your current medications. You may stop the Crestor for the next 2 weeks to see if your legs feel better. If you feel better, call and let us know. If your legs still hurt, you need to get back on the Crestor.  We will see you back in about 4 months.  Call the [facility] office at [phone] if you have any questions, problems or concerns.

## 2011-06-14 NOTE — Progress Notes (Addendum)
   Willie Black Date of Birth: 05/09/1951 Medical Record #[id number]  History of Present Illness: Willie Black is seen today for a one month check. He is seen for Dr. [place]. He has had an event monitor on for the past month due to palpitations. EKG did show PVC's. Event monitor showed bigeminy PVC's. He is on higher doses of Lopressor.   He comes in here today. He is doing very well. Feels good. No complaints of palpitations whatsoever. He is split dosing the Lopressor and taking it 4 times day and tolerates it very well.   Current Outpatient Prescriptions on File Prior to Visit  Medication Sig Dispense Refill  . aspirin 81 MG tablet Take 81 mg by mouth daily.        Marland Kitchen b complex vitamins tablet Take 1 tablet by mouth daily.        . Calcium Carbonate-Vitamin D (CALCIUM + D PO) Take by mouth daily.        . fluticasone (FLONASE) 50 MCG/ACT nasal spray Place 2 sprays into the nose daily.        . metoprolol tartrate (LOPRESSOR) 25 MG tablet Take 1 tablet (25 mg total) by mouth 2 (two) times daily.  60 tablet  5  . omeprazole-sodium bicarbonate (ZEGERID) 40-1100 MG per capsule Take 1 capsule by mouth daily.        . rosuvastatin (CRESTOR) 20 MG tablet Take 10 mg by mouth daily.         No Known Allergies  Past Medical History  Diagnosis Date  . GERD (gastroesophageal reflux disease)   . Renal calculi   . Hypercholesteremia   . CAD (coronary artery disease)     S/P prior CABG  x 3 in December 2011  . PVC (premature ventricular contraction)     Bigeminy PVC's on event monitor Nov 2012  . Anxiety     Past Surgical History  Procedure Date  . Coronary artery bypass graft 05/28/2110    x3 with LIMA to LAD, SVG to OM and SVG to LAD after having ST elevation with coming off pump  . Cervical disc surgery     History  Smoking status  . Former Smoker  . Types: Cigarettes  . Quit date: 04/27/2009  Smokeless tobacco  . Not on file    History  Alcohol Use No    Family  History  Problem Relation Age of Onset  . Aneurysm Father     abdominal  . Coronary artery disease Father     3 stents    Review of Systems: The review of systems is per the HPI.  He is complaining of his legs hurting with walking. Thinks it is his Crestor. All other systems were reviewed and are negative.  Physical Exam: BP 138/72  Pulse 76  Ht 6\' 2"  (1.88 m)  Wt 233 lb (105.688 kg)  BMI 29.92 kg/m2 Patient is very pleasant and in no acute distress. Skin is warm and dry. Color is normal.  HEENT is unremarkable. Normocephalic/atraumatic. PERRL. Sclera are nonicteric. Neck is supple. No masses. No JVD. Lungs are clear. Cardiac exam shows a regular rate and rhythm. No ectopics today. Abdomen is soft. Extremities are without edema. Distal pulses are 2+.  Gait and ROM are intact. No gross neurologic deficits noted.  LABORATORY DATA:   Assessment / Plan:

## 2011-06-24 ENCOUNTER — Telehealth: Payer: Self-pay

## 2011-06-24 NOTE — Telephone Encounter (Signed)
Patient called and told Ecardio monitor was okay.

## 2011-07-19 ENCOUNTER — Encounter: Payer: Self-pay | Admitting: Cardiology

## 2011-08-19 ENCOUNTER — Other Ambulatory Visit: Payer: Self-pay | Admitting: *Deleted

## 2011-08-19 MED ORDER — ROSUVASTATIN CALCIUM 20 MG PO TABS: 10.0000 mg | ORAL_TABLET | Freq: Every day | ORAL | Status: DC

## 2011-09-16 ENCOUNTER — Telehealth: Payer: Self-pay | Admitting: Cardiology

## 2011-09-16 NOTE — Telephone Encounter (Signed)
Patient called was told Dr.Jordan not in office today.Last lab 10/12.Advised since he has am appointment 09/19/11 he can come fasting just in case Dr.Jordan orders labs.

## 2011-09-16 NOTE — Telephone Encounter (Signed)
Pt on crestor and wants to know if blood test is need at/before visit on 3-28, pls call

## 2011-09-19 ENCOUNTER — Encounter: Payer: Self-pay | Admitting: Cardiology

## 2011-09-19 ENCOUNTER — Ambulatory Visit (INDEPENDENT_AMBULATORY_CARE_PROVIDER_SITE_OTHER): Payer: PRIVATE HEALTH INSURANCE | Admitting: Cardiology

## 2011-09-19 VITALS — BP 140/86 | HR 60 | Ht 74.0 in | Wt 238.0 lb

## 2011-09-19 DIAGNOSIS — Z951 Presence of aortocoronary bypass graft: Secondary | ICD-10-CM

## 2011-09-19 DIAGNOSIS — Z8679 Personal history of other diseases of the circulatory system: Secondary | ICD-10-CM

## 2011-09-19 DIAGNOSIS — I10 Essential (primary) hypertension: Secondary | ICD-10-CM

## 2011-09-19 DIAGNOSIS — E785 Hyperlipidemia, unspecified: Secondary | ICD-10-CM

## 2011-09-19 DIAGNOSIS — I2510 Atherosclerotic heart disease of native coronary artery without angina pectoris: Secondary | ICD-10-CM

## 2011-09-19 DIAGNOSIS — E7800 Pure hypercholesterolemia, unspecified: Secondary | ICD-10-CM

## 2011-09-19 NOTE — Assessment & Plan Note (Signed)
He had prior palpitations related to PVCs. These have improved significantly with cessation of caffeine. We will continue beta blocker therapy.

## 2011-09-19 NOTE — Progress Notes (Signed)
   Willie Black Date of Birth: July 05, 1950 Medical Record #[id number]  History of Present Illness: Willie Black is seen today for a follow up visit. He was seen previously for palpitations related to PVCs. Eliminate caffeine in his diet and these have resolved. He currently denies any palpitations, chest pain, or shortness of breath. He still has some discomfort in his left axilla and shoulder which he relates to his bypass surgery. This bothers him most if he is raking leaves.  Current Outpatient Prescriptions on File Prior to Visit  Medication Sig Dispense Refill  . aspirin 81 MG tablet Take 81 mg by mouth daily.        Marland Kitchen b complex vitamins tablet Take 1 tablet by mouth daily.        . Calcium Carbonate-Vitamin D (CALCIUM + D PO) Take by mouth daily.        . fluticasone (FLONASE) 50 MCG/ACT nasal spray Place 2 sprays into the nose daily.        . metoprolol tartrate (LOPRESSOR) 25 MG tablet Take 1 tablet (25 mg total) by mouth 2 (two) times daily.  60 tablet  5  . omeprazole-sodium bicarbonate (ZEGERID) 40-1100 MG per capsule Take 1 capsule by mouth daily.        . rosuvastatin (CRESTOR) 20 MG tablet Take 0.5 tablets (10 mg total) by mouth daily.  30 tablet  6    No Known Allergies  Past Medical History  Diagnosis Date  . GERD (gastroesophageal reflux disease)   . Renal calculi   . Hypercholesteremia   . CAD (coronary artery disease)     S/P prior CABG  x 3 in December 2011  . PVC (premature ventricular contraction)     Bigeminy PVC's on event monitor Nov 2012  . Anxiety     Past Surgical History  Procedure Date  . Coronary artery bypass graft 05/28/2110    x3 with LIMA to LAD, SVG to OM and SVG to LAD after having ST elevation with coming off pump  . Cervical disc surgery     History  Smoking status  . Former Smoker  . Types: Cigarettes  . Quit date: 04/27/2009  Smokeless tobacco  . Not on file    History  Alcohol Use No    Family History  Problem  Relation Age of Onset  . Aneurysm Father     abdominal  . Coronary artery disease Father     3 stents    Review of Systems: As noted in history of present illness. All other systems were reviewed and are negative.  Physical Exam: BP 140/86  Pulse 60  Ht 6\' 2"  (1.88 m)  Wt 238 lb (107.956 kg)  BMI 30.56 kg/m2 Patient is alert and in no acute distress. He remains somewhat anxious.  Skin is warm and dry. Color is normal.  HEENT is unremarkable. Normocephalic/atraumatic. PERRL. Sclera are nonicteric. Neck is supple. No masses. No JVD. Lungs are clear. Cardiac exam shows a regular rate and rhythm. Abdomen is soft. Extremities are without edema. Gait and ROM are intact. No gross neurologic deficits noted.   LABORATORY DATA:   Assessment / Plan:

## 2011-09-19 NOTE — Assessment & Plan Note (Signed)
He continues to do very well. We will continue with risk factor fish and medical management.

## 2011-09-19 NOTE — Assessment & Plan Note (Signed)
His last lipid panel in October was excellent. We'll repeat in 6 months on his next visit.

## 2011-09-19 NOTE — Patient Instructions (Signed)
Continue your current medication  I will see you back in 6 months and check fasting lab work.

## 2011-11-15 ENCOUNTER — Other Ambulatory Visit (HOSPITAL_COMMUNITY): Payer: Self-pay | Admitting: *Deleted

## 2011-11-15 DIAGNOSIS — R002 Palpitations: Secondary | ICD-10-CM

## 2011-11-15 MED ORDER — METOPROLOL TARTRATE 25 MG PO TABS: 25.0000 mg | ORAL_TABLET | Freq: Two times a day (BID) | ORAL | Status: DC

## 2011-11-15 NOTE — Telephone Encounter (Signed)
Refilled metoprolol 

## 2011-11-22 ENCOUNTER — Other Ambulatory Visit: Payer: Self-pay | Admitting: Cardiology

## 2011-11-22 DIAGNOSIS — R002 Palpitations: Secondary | ICD-10-CM

## 2011-11-22 MED ORDER — METOPROLOL TARTRATE 25 MG PO TABS: 25.0000 mg | ORAL_TABLET | Freq: Two times a day (BID) | ORAL | Status: DC

## 2012-01-18 ENCOUNTER — Emergency Department (HOSPITAL_BASED_OUTPATIENT_CLINIC_OR_DEPARTMENT_OTHER)
Admission: EM | Admit: 2012-01-18 | Discharge: 2012-01-18 | Disposition: A | Discharge status: Home / Self Care | Payer: PRIVATE HEALTH INSURANCE | Attending: Emergency Medicine | Admitting: Emergency Medicine

## 2012-01-18 ENCOUNTER — Emergency Department: Admit: 2012-01-18 | Payer: Self-pay

## 2012-01-18 ENCOUNTER — Encounter (HOSPITAL_BASED_OUTPATIENT_CLINIC_OR_DEPARTMENT_OTHER): Payer: Self-pay | Admitting: Emergency Medicine

## 2012-01-18 DIAGNOSIS — E7800 Pure hypercholesterolemia, unspecified: Secondary | ICD-10-CM | POA: Insufficient documentation

## 2012-01-18 DIAGNOSIS — K219 Gastro-esophageal reflux disease without esophagitis: Secondary | ICD-10-CM | POA: Insufficient documentation

## 2012-01-18 DIAGNOSIS — I2510 Atherosclerotic heart disease of native coronary artery without angina pectoris: Secondary | ICD-10-CM | POA: Insufficient documentation

## 2012-01-18 DIAGNOSIS — R002 Palpitations: Secondary | ICD-10-CM | POA: Insufficient documentation

## 2012-01-18 DIAGNOSIS — I493 Ventricular premature depolarization: Secondary | ICD-10-CM

## 2012-01-18 DIAGNOSIS — R42 Dizziness and giddiness: Secondary | ICD-10-CM | POA: Insufficient documentation

## 2012-01-18 DIAGNOSIS — Z87891 Personal history of nicotine dependence: Secondary | ICD-10-CM | POA: Insufficient documentation

## 2012-01-18 DIAGNOSIS — F411 Generalized anxiety disorder: Secondary | ICD-10-CM | POA: Insufficient documentation

## 2012-01-18 DIAGNOSIS — Z951 Presence of aortocoronary bypass graft: Secondary | ICD-10-CM | POA: Insufficient documentation

## 2012-01-18 LAB — BASIC METABOLIC PANEL
BUN: 13 mg/dL (ref 6–23)
CO2: 30 mEq/L (ref 19–32)
Calcium: 10.1 mg/dL (ref 8.4–10.5)
Chloride: 104 mEq/L (ref 96–112)
Creatinine, Ser: 1 mg/dL (ref 0.50–1.35)
GFR calc Af Amer: >90 mL/min (ref >90)
GFR calc non Af Amer: 80 mL/min — ABNORMAL LOW (ref >90)
Glucose, Bld: 96 mg/dL (ref 70–99)
Potassium: 4.5 mEq/L (ref 3.5–5.1)
Sodium: 141 mEq/L (ref 135–145)

## 2012-01-18 LAB — CBC
HCT: 43.9 % (ref 39.0–52.0)
Hemoglobin: 15.1 g/dL (ref 13.0–17.0)
MCH: 32.2 pg (ref 26.0–34.0)
MCHC: 34.4 g/dL (ref 30.0–36.0)
MCV: 93.6 fL (ref 78.0–100.0)
Platelets: 182 10*3/uL (ref 150–400)
RBC: 4.69 MIL/uL (ref 4.22–5.81)
RDW: 13.3 % (ref 11.5–15.5)
WBC: 6.1 10*3/uL (ref 4.0–10.5)

## 2012-01-18 LAB — MAGNESIUM: Magnesium: 2.1 mg/dL (ref 1.5–2.5)

## 2012-01-18 LAB — TROPONIN I: Troponin I: <0.3 ng/mL (ref <0.30)

## 2012-01-18 NOTE — ED Notes (Signed)
Pt sent from local [facility] for irregular heart beats and lightheadedness.

## 2012-01-18 NOTE — ED Provider Notes (Addendum)
History     CSN: [id number]  Arrival date & time 01/18/12  1232   First MD Initiated Contact with Patient 01/18/12 1314      Chief Complaint  Patient presents with  . Palpitations    (Consider location/radiation/quality/duration/timing/severity/associated sxs/prior treatment) HPI Comments: Patient presents with palpitations and mild lightheadedness this morning.  He notes it was more frequent and noticeable earlier in the morning.  He never felt like he would actually pass out lose consciousness.  No chest pain, chest pressure, shortness of breath, nausea or diaphoresis.  Patient states he's felt well and has been without fevers, cough or other infectious symptoms.  Patient states he's been able to play golf and do yard work all week without difficulties.  He went to an urgent care today and made took an EKG and sent him here for further evaluation.  Patient is not noting palpitations currently despite the fact he is showing PVCs on the monitor at this time.  Patient is a 61 y.o. male presenting with palpitations. The history is provided by the patient. No language interpreter was used.  Palpitations  This is a recurrent problem. Pertinent negatives include no fever, no chest pain, no abdominal pain, no nausea, no vomiting, no headaches, no back pain, no weakness, no cough and no shortness of breath.    Past Medical History  Diagnosis Date  . GERD (gastroesophageal reflux disease)   . Renal calculi   . Hypercholesteremia   . CAD (coronary artery disease)     S/P prior CABG  x 3 in December 2011  . PVC (premature ventricular contraction)     Bigeminy PVC's on event monitor Nov 2012  . Anxiety     Past Surgical History  Procedure Date  . Coronary artery bypass graft 05/28/2110    x3 with LIMA to LAD, SVG to OM and SVG to LAD after having ST elevation with coming off pump  . Cervical disc surgery     Family History  Problem Relation Age of Onset  . Aneurysm Father    abdominal  . Coronary artery disease Father     3 stents    History  Substance Use Topics  . Smoking status: Former Smoker    Types: Cigarettes    Quit date: 04/27/2009  . Smokeless tobacco: Not on file  . Alcohol Use: No      Review of Systems  Constitutional: Negative.  Negative for fever and chills.  HENT: Negative.   Eyes: Negative.   Respiratory: Negative.  Negative for cough and shortness of breath.   Cardiovascular: Positive for palpitations. Negative for chest pain.  Gastrointestinal: Negative.  Negative for nausea, vomiting and abdominal pain.  Genitourinary: Negative.   Musculoskeletal: Negative.  Negative for back pain.  Skin: Negative.  Negative for color change and rash.  Neurological: Positive for light-headedness. Negative for syncope, weakness and headaches.  Hematological: Negative.  Negative for adenopathy.  Psychiatric/Behavioral: Negative.  Negative for confusion.  All other systems reviewed and are negative.    Allergies  Review of patient's allergies indicates no known allergies.  Home Medications   Current Outpatient Rx  Name Route Sig Dispense Refill  . ASPIRIN 81 MG PO TABS Oral Take 81 mg by mouth daily.      . B COMPLEX PO TABS Oral Take 1 tablet by mouth daily.      Marland Kitchen CALCIUM + D PO Oral Take by mouth daily.      Marland Kitchen FLUTICASONE PROPIONATE 50 MCG/ACT NA  SUSP Nasal Place 2 sprays into the nose daily.      Marland Kitchen METOPROLOL TARTRATE 25 MG PO TABS Oral Take 1 tablet (25 mg total) by mouth 2 (two) times daily. 60 tablet 11    90 day supply is acceptable  . OMEPRAZOLE-SODIUM BICARBONATE 40-1100 MG PO CAPS Oral Take 1 capsule by mouth daily.      Marland Kitchen ROSUVASTATIN CALCIUM 20 MG PO TABS Oral Take 0.5 tablets (10 mg total) by mouth daily. 30 tablet 6    There were no vitals taken for this visit.  Physical Exam  Nursing note and vitals reviewed. Constitutional: He is oriented to person, place, and time. He appears well-developed and well-nourished.   Non-toxic appearance. He does not have a sickly appearance.  HENT:  Head: Normocephalic and atraumatic.  Eyes: Conjunctivae, EOM and lids are normal. Pupils are equal, round, and reactive to light.  Neck: Trachea normal, normal range of motion and full passive range of motion without pain. Neck supple.  Cardiovascular: Normal rate, regular rhythm and normal heart sounds.  Exam reveals no gallop.   No murmur heard. Pulmonary/Chest: Effort normal and breath sounds normal. No respiratory distress. He has no wheezes. He has no rales.  Abdominal: Soft. Normal appearance. He exhibits no distension. There is no tenderness. There is no rebound and no CVA tenderness.  Musculoskeletal: Normal range of motion. He exhibits no edema.  Neurological: He is alert and oriented to person, place, and time. He has normal strength.  Skin: Skin is warm, dry and intact. No rash noted.  Psychiatric: He has a normal mood and affect. His behavior is normal. Judgment and thought content normal.    ED Course  Procedures (including critical care time)  Results for orders placed during the hospital encounter of 01/18/12  CBC      Component Value Range   WBC 6.1  4.0 - 10.5 K/uL   RBC 4.69  4.22 - 5.81 MIL/uL   Hemoglobin 15.1  13.0 - 17.0 g/dL   HCT 78.2  95.6 - 21.3 %   MCV 93.6  78.0 - 100.0 fL   MCH 32.2  26.0 - 34.0 pg   MCHC 34.4  30.0 - 36.0 g/dL   RDW 08.6  57.8 - 46.9 %   Platelets 182  150 - 400 K/uL  BASIC METABOLIC PANEL      Component Value Range   Sodium 141  135 - 145 mEq/L   Potassium 4.5  3.5 - 5.1 mEq/L   Chloride 104  96 - 112 mEq/L   CO2 30  19 - 32 mEq/L   Glucose, Bld 96  70 - 99 mg/dL   BUN 13  6 - 23 mg/dL   Creatinine, Ser 6.29  0.50 - 1.35 mg/dL   Calcium 52.8  8.4 - 41.3 mg/dL   GFR calc non Af Amer 80 (*) >90 mL/min   GFR calc Af Amer >90  >90 mL/min  MAGNESIUM      Component Value Range   Magnesium 2.1  1.5 - 2.5 mg/dL  TROPONIN I      Component Value Range   Troponin I  <0.30  <0.30 ng/mL       Date: 01/18/2012  Rate: 62  Rhythm: normal sinus rhythm with frequent PVCs  QRS Axis: normal  Intervals: normal  ST/T Wave abnormalities: normal  Conduction Disutrbances:none  Narrative Interpretation:   Old EKG Reviewed: changes noted from 11-22-10 when no PVCs were noted    MDM  Patient presents with palpitations and mild lightheadedness but denies that he ever truly felt presyncopal.  This appears to be related to frequent PVCs that are noted on his EKG here today and on the telemetry monitor.  On review of patient's past medical records from Dr. Swaziland patient has had a history of palpitations related to PVCs.  Patient notes he still continued to decrease his caffeine and chocolate intake which had helped.  I will check the patient's electrolytes and troponin here today as well.  Patient has otherwise been well and shows no signs of infection as a cause for palpitations.  I will contact cardiology with these results to determine if they want to make any further medication changes.    Nat Christen, MD 01/18/12 1321  Pt discussed with Dr. Dietrich Pates and he recommends that we increase the patient's metoprolol to 25 mg 3 times a day from twice a day and that the patient call for a followup appointment with Dr. Sw[place]iland for later this week.  Patient's blood pressures here have been normal and heart rate in the 60s to 70s so I believe he will tolerate this well.    Nat Christen, MD 01/18/12 [id number]

## 2012-01-18 NOTE — ED Notes (Signed)
Pt denies sob or chest pain.  Pt states the palpitations have decreased.  No acute distress noted.

## 2012-01-20 ENCOUNTER — Telehealth: Payer: Self-pay | Admitting: Cardiology

## 2012-01-20 NOTE — Telephone Encounter (Signed)
Pt was seen at [facility] ED pt had irregular ekg and per pt was told to be seen today due to low heart rate

## 2012-01-20 NOTE — Telephone Encounter (Signed)
Pt scheduled to see Dr. Swaziland on 01/23/2012.

## 2012-01-23 ENCOUNTER — Ambulatory Visit (INDEPENDENT_AMBULATORY_CARE_PROVIDER_SITE_OTHER): Payer: PRIVATE HEALTH INSURANCE | Admitting: Cardiology

## 2012-01-23 ENCOUNTER — Other Ambulatory Visit: Payer: Self-pay

## 2012-01-23 ENCOUNTER — Encounter: Payer: Self-pay | Admitting: Cardiology

## 2012-01-23 VITALS — BP 102/68 | HR 69 | Ht 74.0 in | Wt 233.0 lb

## 2012-01-23 DIAGNOSIS — I2510 Atherosclerotic heart disease of native coronary artery without angina pectoris: Secondary | ICD-10-CM

## 2012-01-23 DIAGNOSIS — I493 Ventricular premature depolarization: Secondary | ICD-10-CM

## 2012-01-23 DIAGNOSIS — E7800 Pure hypercholesterolemia, unspecified: Secondary | ICD-10-CM

## 2012-01-23 DIAGNOSIS — I4949 Other premature depolarization: Secondary | ICD-10-CM

## 2012-01-23 MED ORDER — METOPROLOL TARTRATE 25 MG PO TABS: 25.0000 mg | ORAL_TABLET | Freq: Three times a day (TID) | ORAL | Status: DC

## 2012-01-23 NOTE — Patient Instructions (Addendum)
Continue your current therapy  Keep your appointment in September

## 2012-01-23 NOTE — Assessment & Plan Note (Signed)
Patient has known PVCs. This was documented last year by Holter monitor. There are no clear triggers. He has normal LV function. No ischemic symptoms. He is status post CABG in December of 2011. I've reassured him that these PVCs are benign. We will continue with his current dose of metoprolol. He will keep his scheduled followup with me in September.

## 2012-01-23 NOTE — Progress Notes (Signed)
   Willie Black Date of Birth: 01-20-1951 Medical Record #[id number]  History of Present Illness: Willie Black is seen today for a follow up visit. He recently noted increased symptoms of palpitations. His palpable pulse was reduced. He went to the emergency room and was noted to have unifocal PVCs. His electrolytes were normal. Cardiac enzymes were normal. ECG showed normal sinus rhythm with unifocal PVCs. It was otherwise normal. His metoprolol dose was increased to 25 mg 3 times a day. Over the last 3 days he states he has felt very well. He denies any chest pain or dyspnea. He has had no lightheadedness or syncope.  Current Outpatient Prescriptions on File Prior to Visit  Medication Sig Dispense Refill  . aspirin 81 MG tablet Take 81 mg by mouth daily.        Marland Kitchen b complex vitamins tablet Take 1 tablet by mouth daily.        . Calcium Carbonate-Vitamin D (CALCIUM + D PO) Take by mouth daily.        Marland Kitchen omeprazole-sodium bicarbonate (ZEGERID) 40-1100 MG per capsule Take 1 capsule by mouth daily.        . rosuvastatin (CRESTOR) 20 MG tablet Take 0.5 tablets (10 mg total) by mouth daily.  30 tablet  6  . DISCONTD: metoprolol tartrate (LOPRESSOR) 25 MG tablet Take 1 tablet (25 mg total) by mouth 2 (two) times daily.  60 tablet  11    No Known Allergies  Past Medical History  Diagnosis Date  . GERD (gastroesophageal reflux disease)   . Renal calculi   . Hypercholesteremia   . CAD (coronary artery disease)     S/P prior CABG  x 3 in December 2011  . PVC (premature ventricular contraction)     Bigeminy PVC's on event monitor Nov 2012  . Anxiety     Past Surgical History  Procedure Date  . Coronary artery bypass graft 05/28/2110    x3 with LIMA to LAD, SVG to OM and SVG to LAD after having ST elevation with coming off pump  . Cervical disc surgery     History  Smoking status  . Former Smoker  . Types: Cigarettes  . Quit date: 04/27/2009  Smokeless tobacco  . Not on file     History  Alcohol Use No    Family History  Problem Relation Age of Onset  . Aneurysm Father     abdominal  . Coronary artery disease Father     3 stents    Review of Systems: As noted in history of present illness. All other systems were reviewed and are negative.  Physical Exam: BP 102/68  Pulse 69  Ht 6\' 2"  (1.88 m)  Wt 105.688 kg (233 lb)  BMI 29.92 kg/m2  SpO2 98% Patient is alert and in no acute distress. He remains somewhat anxious.  Skin is warm and dry. Color is normal.  HEENT is unremarkable. Normocephalic/atraumatic. PERRL. Sclera are nonicteric. Neck is supple. No masses. No JVD. Lungs are clear. Cardiac exam shows a regular rate and rhythm. Abdomen is soft. Extremities are without edema. Gait and ROM are intact. No gross neurologic deficits noted.   LABORATORY DATA:  Laboratory data from the emergency department on July 31 was reviewed. Assessment / Plan:

## 2012-02-12 ENCOUNTER — Telehealth: Payer: Self-pay | Admitting: Cardiology

## 2012-02-12 MED ORDER — METOPROLOL TARTRATE 25 MG PO TABS: 25.0000 mg | ORAL_TABLET | Freq: Three times a day (TID) | ORAL | Status: DC

## 2012-02-12 NOTE — Telephone Encounter (Signed)
Pt went to pick up metoprolol at [facility] , computor shows it was called in 01-23-12, [facility] pls call in only has two pills left

## 2012-02-12 NOTE — Telephone Encounter (Signed)
Patient called stated he needed metoprolol renewed.Prescription for [facility]oprolol called to [facility] [address].

## 2012-03-02 ENCOUNTER — Other Ambulatory Visit (INDEPENDENT_AMBULATORY_CARE_PROVIDER_SITE_OTHER): Payer: PRIVATE HEALTH INSURANCE

## 2012-03-02 DIAGNOSIS — E7800 Pure hypercholesterolemia, unspecified: Secondary | ICD-10-CM

## 2012-03-02 LAB — HEPATIC FUNCTION PANEL
ALT: 18 U/L (ref 0–53)
AST: 20 U/L (ref 0–37)
Albumin: 4.1 g/dL (ref 3.5–5.2)
Alkaline Phosphatase: 60 U/L (ref 39–117)
Bilirubin, Direct: 0 mg/dL (ref 0.0–0.3)
Total Bilirubin: 0.2 mg/dL — ABNORMAL LOW (ref 0.3–1.2)
Total Protein: 7.1 g/dL (ref 6.0–8.3)

## 2012-03-02 LAB — LIPID PANEL
Cholesterol: 126 mg/dL (ref 0–200)
HDL: 54.5 mg/dL (ref >39.00)
LDL Cholesterol: 59 mg/dL (ref 0–99)
Total CHOL/HDL Ratio: 2
Triglycerides: 65 mg/dL (ref 0.0–149.0)
VLDL: 13 mg/dL (ref 0.0–40.0)

## 2012-09-02 ENCOUNTER — Other Ambulatory Visit: Payer: Self-pay

## 2012-09-02 MED ORDER — ROSUVASTATIN CALCIUM 20 MG PO TABS: 10.0000 mg | ORAL_TABLET | Freq: Every day | ORAL | Status: DC

## 2012-09-18 ENCOUNTER — Other Ambulatory Visit: Payer: Self-pay | Admitting: Cardiology

## 2012-12-08 ENCOUNTER — Other Ambulatory Visit: Payer: Self-pay | Admitting: Cardiology

## 2012-12-31 ENCOUNTER — Telehealth: Payer: Self-pay | Admitting: Cardiology

## 2012-12-31 NOTE — Telephone Encounter (Signed)
Called patient and advised that he needs to fast tomorrow am because he has not had labs completed in at least 1 year. Advised he will see the doctor first and then have labs done per what Dr.Jordan orders.

## 2012-12-31 NOTE — Telephone Encounter (Signed)
New problem    Pt wants to know if he needs fasting labs for appt tomorrow

## 2013-01-01 ENCOUNTER — Ambulatory Visit (INDEPENDENT_AMBULATORY_CARE_PROVIDER_SITE_OTHER): Payer: PRIVATE HEALTH INSURANCE | Admitting: Cardiology

## 2013-01-01 ENCOUNTER — Encounter: Payer: Self-pay | Admitting: Cardiology

## 2013-01-01 VITALS — BP 112/78 | HR 52 | Ht 74.0 in | Wt 220.4 lb

## 2013-01-01 DIAGNOSIS — E7800 Pure hypercholesterolemia, unspecified: Secondary | ICD-10-CM

## 2013-01-01 DIAGNOSIS — I493 Ventricular premature depolarization: Secondary | ICD-10-CM

## 2013-01-01 DIAGNOSIS — I4949 Other premature depolarization: Secondary | ICD-10-CM

## 2013-01-01 DIAGNOSIS — I2510 Atherosclerotic heart disease of native coronary artery without angina pectoris: Secondary | ICD-10-CM

## 2013-01-01 LAB — BASIC METABOLIC PANEL
BUN: 15 mg/dL (ref 6–23)
CO2: 30 mEq/L (ref 19–32)
Calcium: 9.9 mg/dL (ref 8.4–10.5)
Chloride: 106 mEq/L (ref 96–112)
Creatinine, Ser: 1 mg/dL (ref 0.4–1.5)
GFR: 78.92 mL/min (ref >60.00)
Glucose, Bld: 91 mg/dL (ref 70–99)
Potassium: 5 mEq/L (ref 3.5–5.1)
Sodium: 141 mEq/L (ref 135–145)

## 2013-01-01 LAB — LIPID PANEL
Cholesterol: 144 mg/dL (ref 0–200)
HDL: 52.8 mg/dL (ref >39.00)
LDL Cholesterol: 76 mg/dL (ref 0–99)
Total CHOL/HDL Ratio: 3
Triglycerides: 74 mg/dL (ref 0.0–149.0)
VLDL: 14.8 mg/dL (ref 0.0–40.0)

## 2013-01-01 LAB — HEPATIC FUNCTION PANEL
ALT: 15 U/L (ref 0–53)
AST: 21 U/L (ref 0–37)
Albumin: 4.5 g/dL (ref 3.5–5.2)
Alkaline Phosphatase: 67 U/L (ref 39–117)
Bilirubin, Direct: 0 mg/dL (ref 0.0–0.3)
Total Bilirubin: 0.5 mg/dL (ref 0.3–1.2)
Total Protein: 7.8 g/dL (ref 6.0–8.3)

## 2013-01-01 MED ORDER — ROSUVASTATIN CALCIUM 20 MG PO TABS: 10.0000 mg | ORAL_TABLET | Freq: Every day | ORAL | Status: DC

## 2013-01-01 NOTE — Progress Notes (Signed)
Willie Black Date of Birth: 1950-11-22 Medical Record #[id number]  History of Present Illness: Willie Black is seen today for a follow up visit. He has a history of symptomatic PVCs. He also has a history of coronary disease and is status post CABG in December of 2011. He reports that he is doing very well with the exception that he feels very fatigued on metoprolol. He rarely has palpitations and has really only noted this on 2 occasions in the past year. This usually occurs at night when he is resting. He has done an excellent job losing weight and has lost 13 pounds. He exercises regularly. He is very busy at work.  Current Outpatient Prescriptions on File Prior to Visit  Medication Sig Dispense Refill  . aspirin 81 MG tablet Take 81 mg by mouth daily.        . Calcium Carbonate-Vitamin D (CALCIUM + D PO) Take by mouth daily.        . metoprolol tartrate (LOPRESSOR) 25 MG tablet TAKE 1 TABLET (25 MG TOTAL) BY MOUTH 3 (THREE) TIMES DAILY.  90 tablet  0  . omeprazole-sodium bicarbonate (ZEGERID) 40-1100 MG per capsule Take 1 capsule by mouth daily.         No current facility-administered medications on file prior to visit.    No Known Allergies  Past Medical History  Diagnosis Date  . GERD (gastroesophageal reflux disease)   . Renal calculi   . Hypercholesteremia   . CAD (coronary artery disease)     S/P prior CABG  x 3 in December 2011  . PVC (premature ventricular contraction)     Bigeminy PVC's on event monitor Nov 2012  . Anxiety     Past Surgical History  Procedure Laterality Date  . Coronary artery bypass graft  05/28/2110    x3 with LIMA to LAD, SVG to OM and SVG to LAD after having ST elevation with coming off pump  . Cervical disc surgery      History  Smoking status  . Former Smoker  . Types: Cigarettes  . Quit date: 04/27/2009  Smokeless tobacco  . Not on file    History  Alcohol Use No    Family History  Problem Relation Age of Onset  .  Aneurysm Father     abdominal  . Coronary artery disease Father     3 stents    Review of Systems: As noted in history of present illness. All other systems were reviewed and are negative.  Physical Exam: BP 112/78  Pulse 52  Ht 6\' 2"  (1.88 m)  Wt 220 lb 6.4 oz (99.973 kg)  BMI 28.29 kg/m2 Patient is alert and in no acute distress. He remains somewhat anxious.  Skin is warm and dry. Color is normal.  HEENT is unremarkable. Normocephalic/atraumatic. PERRL. Sclera are nonicteric. Neck is supple. No masses. No JVD. Lungs are clear. Cardiac exam shows a regular rate and rhythm. Abdomen is soft. Extremities are without edema. Gait and ROM are intact. No gross neurologic deficits noted.   LABORATORY DATA:  ECG today demonstrates sinus bradycardia with a rate of 52 beats per minute. It is otherwise normal.  Assessment / Plan: 1. Coronary disease status post CABG in December 2011. He is asymptomatic. Continue risk factor modification.  2. PVCs. Minimally symptomatic. I recommended reducing his metoprolol to twice a day to alleviate his symptoms of fatigue. If he has a day where he has a lot of palpitations he may take an  extra dose.  3. Hyperlipidemia. We'll check fasting lab work today including chemistries and lipid panel. Continue Crestor.

## 2013-01-01 NOTE — Patient Instructions (Addendum)
You can try and reduce metoprolol to twice a day. If you are having a lot of skipping you can take an extra dose.  We will check lab work today.  I will see you in one year.

## 2013-01-12 ENCOUNTER — Other Ambulatory Visit: Payer: Self-pay | Admitting: Cardiology

## 2013-02-23 ENCOUNTER — Ambulatory Visit: Payer: PRIVATE HEALTH INSURANCE | Admitting: Internal Medicine

## 2013-04-06 ENCOUNTER — Telehealth: Payer: Self-pay | Admitting: Internal Medicine

## 2013-04-06 NOTE — Telephone Encounter (Signed)
Patient called requesting to reschedule an appointment, he is considered as a new patient his appointment  Was scheduled on 01/01/2013 as a new patient but has canceled or rescheduled his appointment Please advise if its okay to reschedule his appointment since your no longer seeing new patient

## 2013-04-06 NOTE — Telephone Encounter (Signed)
Yes, you can reschedule

## 2013-04-14 ENCOUNTER — Ambulatory Visit: Payer: PRIVATE HEALTH INSURANCE | Admitting: Internal Medicine

## 2013-04-29 ENCOUNTER — Other Ambulatory Visit: Payer: Self-pay

## 2013-05-17 ENCOUNTER — Ambulatory Visit: Payer: PRIVATE HEALTH INSURANCE | Admitting: Internal Medicine

## 2013-05-28 ENCOUNTER — Other Ambulatory Visit: Payer: Self-pay | Admitting: Cardiology

## 2013-06-15 ENCOUNTER — Other Ambulatory Visit: Payer: Self-pay | Admitting: Cardiology

## 2013-06-30 ENCOUNTER — Ambulatory Visit: Payer: PRIVATE HEALTH INSURANCE | Admitting: Internal Medicine

## 2013-08-06 NOTE — Telephone Encounter (Signed)
No other info °

## 2013-08-19 ENCOUNTER — Ambulatory Visit: Payer: PRIVATE HEALTH INSURANCE | Admitting: Internal Medicine

## 2013-09-30 ENCOUNTER — Other Ambulatory Visit (INDEPENDENT_AMBULATORY_CARE_PROVIDER_SITE_OTHER): Payer: BC Managed Care – PPO

## 2013-09-30 ENCOUNTER — Ambulatory Visit (INDEPENDENT_AMBULATORY_CARE_PROVIDER_SITE_OTHER): Payer: BC Managed Care – PPO | Admitting: Internal Medicine

## 2013-09-30 ENCOUNTER — Encounter: Payer: Self-pay | Admitting: Internal Medicine

## 2013-09-30 VITALS — BP 120/78 | HR 61 | Temp 97.7°F | Resp 16 | Wt 220.0 lb

## 2013-09-30 DIAGNOSIS — Z23 Encounter for immunization: Secondary | ICD-10-CM

## 2013-09-30 DIAGNOSIS — I714 Abdominal aortic aneurysm, without rupture, unspecified: Secondary | ICD-10-CM | POA: Diagnosis not present

## 2013-09-30 DIAGNOSIS — L03019 Cellulitis of unspecified finger: Secondary | ICD-10-CM | POA: Diagnosis not present

## 2013-09-30 DIAGNOSIS — M79609 Pain in unspecified limb: Secondary | ICD-10-CM

## 2013-09-30 DIAGNOSIS — B351 Tinea unguium: Secondary | ICD-10-CM

## 2013-09-30 DIAGNOSIS — Z0000 Encounter for general adult medical examination without abnormal findings: Secondary | ICD-10-CM | POA: Insufficient documentation

## 2013-09-30 DIAGNOSIS — E7800 Pure hypercholesterolemia, unspecified: Secondary | ICD-10-CM | POA: Diagnosis not present

## 2013-09-30 DIAGNOSIS — IMO0001 Reserved for inherently not codable concepts without codable children: Secondary | ICD-10-CM

## 2013-09-30 LAB — URINALYSIS, ROUTINE W REFLEX MICROSCOPIC
Bilirubin Urine: NEGATIVE
Hgb urine dipstick: NEGATIVE
Ketones, ur: NEGATIVE
Leukocytes, UA: NEGATIVE
Nitrite: NEGATIVE
RBC / HPF: NONE SEEN (ref 0–?)
Specific Gravity, Urine: 1.01 (ref 1.000–1.030)
Total Protein, Urine: NEGATIVE
Urine Glucose: NEGATIVE
Urobilinogen, UA: 0.2 (ref 0.0–1.0)
WBC, UA: NONE SEEN (ref 0–?)
pH: 7 (ref 5.0–8.0)

## 2013-09-30 LAB — COMPREHENSIVE METABOLIC PANEL
ALT: 19 U/L (ref 0–53)
AST: 19 U/L (ref 0–37)
Albumin: 4.2 g/dL (ref 3.5–5.2)
Alkaline Phosphatase: 60 U/L (ref 39–117)
BUN: 15 mg/dL (ref 6–23)
CO2: 30 mEq/L (ref 19–32)
Calcium: 9.4 mg/dL (ref 8.4–10.5)
Chloride: 104 mEq/L (ref 96–112)
Creatinine, Ser: 1 mg/dL (ref 0.4–1.5)
GFR: 82.45 mL/min (ref >60.00)
Glucose, Bld: 94 mg/dL (ref 70–99)
Potassium: 4.3 mEq/L (ref 3.5–5.1)
Sodium: 141 mEq/L (ref 135–145)
Total Bilirubin: 0.7 mg/dL (ref 0.3–1.2)
Total Protein: 6.8 g/dL (ref 6.0–8.3)

## 2013-09-30 LAB — LIPID PANEL
Cholesterol: 124 mg/dL (ref 0–200)
HDL: 47.8 mg/dL (ref >39.00)
LDL Cholesterol: 65 mg/dL (ref 0–99)
Total CHOL/HDL Ratio: 3
Triglycerides: 57 mg/dL (ref 0.0–149.0)
VLDL: 11.4 mg/dL (ref 0.0–40.0)

## 2013-09-30 LAB — CBC WITH DIFFERENTIAL/PLATELET
Basophils Absolute: 0.1 10*3/uL (ref 0.0–0.1)
Basophils Relative: 1.4 % (ref 0.0–3.0)
Eosinophils Absolute: 0.3 10*3/uL (ref 0.0–0.7)
Eosinophils Relative: 6.2 % — ABNORMAL HIGH (ref 0.0–5.0)
HCT: 41.1 % (ref 39.0–52.0)
Hemoglobin: 14 g/dL (ref 13.0–17.0)
Lymphocytes Relative: 34.6 % (ref 12.0–46.0)
Lymphs Abs: 1.6 10*3/uL (ref 0.7–4.0)
MCHC: 34 g/dL (ref 30.0–36.0)
MCV: 95.4 fl (ref 78.0–100.0)
Monocytes Absolute: 0.6 10*3/uL (ref 0.1–1.0)
Monocytes Relative: 13.6 % — ABNORMAL HIGH (ref 3.0–12.0)
Neutro Abs: 2 10*3/uL (ref 1.4–7.7)
Neutrophils Relative %: 44.2 % (ref 43.0–77.0)
Platelets: 175 10*3/uL (ref 150.0–400.0)
RBC: 4.31 Mil/uL (ref 4.22–5.81)
RDW: 14 % (ref 11.5–14.6)
WBC: 4.6 10*3/uL (ref 4.5–10.5)

## 2013-09-30 LAB — TSH: TSH: 1.04 u[IU]/mL (ref 0.35–5.50)

## 2013-09-30 LAB — FECAL OCCULT BLOOD, GUAIAC: Fecal Occult Blood: NEGATIVE

## 2013-09-30 LAB — PSA: PSA: 0.45 ng/mL (ref 0.10–4.00)

## 2013-09-30 MED ORDER — EFINACONAZOLE 10 % EX SOLN: 1.0000 | Freq: Every day | CUTANEOUS | Status: DC

## 2013-09-30 MED ORDER — CEFUROXIME AXETIL 500 MG PO TABS: 500.0000 mg | ORAL_TABLET | Freq: Two times a day (BID) | ORAL | Status: DC

## 2013-09-30 NOTE — Assessment & Plan Note (Signed)
Will treat the fungus with jublia and possible strep with ceftin If it does not resolve with this then I have asked him to see hand surgery for further evaluation

## 2013-09-30 NOTE — Assessment & Plan Note (Signed)
Will try jublia for this

## 2013-09-30 NOTE — Progress Notes (Signed)
Subjective:    Patient ID: Willie Black, male    DOB: 11-17-1950, 63 y.o.   MRN: [id number]  HPI Comments: He comes in today for a physical but he also complains of trouble with his left middle finger for several years - the nail is painful and abnormal and there is an area of redness and swelling just proximal to the fingernail. Rash This is a new problem. The current episode started in the past 7 days. The problem has been rapidly improving since onset. The affected locations include the left hand and right hand. The rash is characterized by dryness and itchiness. He was exposed to plant contact. Associated symptoms include nail changes. Pertinent negatives include no anorexia, congestion, cough, diarrhea, eye pain, facial edema, fatigue, fever, joint pain, rhinorrhea, shortness of breath, sore throat or vomiting. Past treatments include topical steroids. The treatment provided significant relief. There is no history of allergies, asthma, eczema or varicella.      Review of Systems  Constitutional: Negative.  Negative for fever, chills, diaphoresis, appetite change and fatigue.  HENT: Negative.  Negative for congestion, rhinorrhea and sore throat.   Eyes: Negative.  Negative for pain.  Respiratory: Negative.  Negative for apnea, cough, choking, chest tightness, shortness of breath, wheezing and stridor.   Cardiovascular: Negative.  Negative for chest pain, palpitations and leg swelling.  Gastrointestinal: Negative.  Negative for nausea, vomiting, abdominal pain, diarrhea, constipation, blood in stool and anorexia.  Endocrine: Negative.   Genitourinary: Negative.   Musculoskeletal: Negative.  Negative for joint pain.  Skin: Positive for nail changes and rash. Negative for color change, pallor and wound.  Allergic/Immunologic: Negative.   Neurological: Negative.   Hematological: Negative.  Negative for adenopathy. Does not bruise/bleed easily.  Psychiatric/Behavioral: Negative.         Objective:   Physical Exam  Vitals reviewed. Constitutional: He is oriented to person, place, and time. He appears well-developed and well-nourished. No distress.  HENT:  Head: Normocephalic and atraumatic.  Mouth/Throat: Oropharynx is clear and moist. No oropharyngeal exudate.  Eyes: Conjunctivae are normal. Right eye exhibits no discharge. Left eye exhibits no discharge. No scleral icterus.  Neck: Normal range of motion. Neck supple. No JVD present. No tracheal deviation present. No thyromegaly present.  Cardiovascular: Normal rate, regular rhythm, normal heart sounds and intact distal pulses.  Exam reveals no gallop and no friction rub.   No murmur heard. Pulmonary/Chest: Effort normal and breath sounds normal. No stridor. No respiratory distress. He has no wheezes. He has no rales. He exhibits no tenderness.  Abdominal: Soft. Normal appearance and bowel sounds are normal. He exhibits no distension, no abdominal bruit, no ascites, no pulsatile midline mass and no mass. There is no tenderness. There is no rebound and no guarding. Hernia confirmed negative in the right inguinal area and confirmed negative in the left inguinal area.  Genitourinary: Prostate normal, testes normal and penis normal. Rectal exam shows external hemorrhoid and internal hemorrhoid. Rectal exam shows no fissure, no mass and no tenderness. Guaiac negative stool. Prostate is not enlarged and not tender. Right testis shows no mass, no swelling and no tenderness. Right testis is descended. Left testis shows no mass, no swelling and no tenderness. Left testis is descended. Circumcised. No penile erythema or penile tenderness. No discharge found.  Musculoskeletal: Normal range of motion. He exhibits no edema and no tenderness.  Lymphadenopathy:    He has no cervical adenopathy.       Right: No inguinal adenopathy  present.       Left: No inguinal adenopathy present.  Neurological: He is oriented to person, place, and  time.  Skin: Skin is warm and dry. Rash noted. No purpura noted. Rash is papular and maculopapular. Rash is not macular, not nodular, not pustular, not vesicular and not urticarial. He is not diaphoretic. No erythema. No pallor.  Left middle finger nail is not pitted but it is dystrophic with lysis and thickening distally, there is no exudate or abscess under the nail though. The proximal nail is intact. In the paronychial area proximal the the nail there is a very subtle area of erythema but there is no discreet collection of pus, warmth, or tenderness. There is no proximal streaking.  Over the skin there are scattered area of erythema and scale in groups and linear configurations c/w contact dermatitis.  Psychiatric: He has a normal mood and affect. His behavior is normal. Judgment and thought content normal.     Lab Results  Component Value Date   WBC 6.1 01/18/2012   HGB 15.1 01/18/2012   HCT 43.9 01/18/2012   PLT 182 01/18/2012   GLUCOSE 91 01/01/2013   CHOL 144 01/01/2013   TRIG 74.0 01/01/2013   HDL 52.80 01/01/2013   LDLCALC 76 01/01/2013   ALT 15 01/01/2013   AST 21 01/01/2013   NA 141 01/01/2013   K 5.0 01/01/2013   CL 106 01/01/2013   CREATININE 1.0 01/01/2013   BUN 15 01/01/2013   CO2 30 01/01/2013   TSH 1.00 05/08/2011   INR 0.97 11/22/2010       Assessment & Plan:

## 2013-09-30 NOTE — Assessment & Plan Note (Signed)
I have asked him to get an updated U/S done to see if the bulge has enlarged

## 2013-09-30 NOTE — Progress Notes (Signed)
Pre visit review using our clinic review tool, if applicable. No additional management support is needed unless otherwise documented below in the visit note. 

## 2013-09-30 NOTE — Patient Instructions (Signed)
Paronychia Paronychia is an inflammatory reaction involving the folds of the skin surrounding the fingernail. This is commonly caused by an infection in the skin around a nail. The most common cause of paronychia is frequent wetting of the hands (as seen with bartenders, food servers, nurses or others who wet their hands). This makes the skin around the fingernail susceptible to infection by bacteria (germs) or fungus. Other predisposing factors are:  Aggressive manicuring.  Nail biting.  Thumb sucking. The most common cause is a staphylococcal (a type of germ) infection, or a fungal (Candida) infection. When caused by a germ, it usually comes on suddenly with redness, swelling, pus and is often painful. It may get under the nail and form an abscess (collection of pus), or form an abscess around the nail. If the nail itself is infected with a fungus, the treatment is usually prolonged and may require oral medicine for up to one year. Your caregiver will determine the length of time treatment is required. The paronychia caused by bacteria (germs) may largely be avoided by not pulling on hangnails or picking at cuticles. When the infection occurs at the tips of the finger it is called felon. When the cause of paronychia is from the herpes simplex virus (HSV) it is called herpetic whitlow. TREATMENT  When an abscess is present treatment is often incision and drainage. This means that the abscess must be cut open so the pus can get out. When this is done, the following home care instructions should be followed. HOME CARE INSTRUCTIONS   It is important to keep the affected fingers very dry. Rubber or plastic gloves over cotton gloves should be used whenever the hand must be placed in water.  Keep wound clean, dry and dressed as suggested by your caregiver between warm soaks or warm compresses.  Soak in warm water for fifteen to twenty minutes three to four times per day for bacterial infections. Fungal  infections are very difficult to treat, so often require treatment for long periods of time.  For bacterial (germ) infections take antibiotics (medicine which kill germs) as directed and finish the prescription, even if the problem appears to be solved before the medicine is gone.  Only take over-the-counter or prescription medicines for pain, discomfort, or fever as directed by your caregiver. SEEK IMMEDIATE MEDICAL CARE IF:  You have redness, swelling, or increasing pain in the wound.  You notice pus coming from the wound.  You have a fever.  You notice a bad smell coming from the wound or dressing. Document Released: 12/04/2000 Document Revised: 09/02/2011 Document Reviewed: 08/05/2008 [facility] Patient Information 2014 [place]. Health Maintenance, Males A healthy lifestyle and preventative care can promote health and wellness.  Maintain regular health, dental, and eye exams.  Eat a healthy diet. Foods like vegetables, fruits, whole grains, low-fat dairy products, and lean protein foods contain the nutrients you need and are low in calories. Decrease your intake of foods high in solid fats, added sugars, and salt. Get information about a proper diet from your health care provider, if necessary.  Regular physical exercise is one of the most important things you can do for your health. Most adults should get at least 150 minutes of moderate-intensity exercise (any activity that increases your heart rate and causes you to sweat) each week. In addition, most adults need muscle-strengthening exercises on 2 or more days a week.   Maintain a healthy weight. The body mass index (BMI) is a screening tool to identify  possible weight problems. It provides an estimate of body fat based on height and weight. Your health care provider can find your BMI and can help you achieve or maintain a healthy weight. For males 20 years and older:  A BMI below 18.5 is considered underweight.  A BMI of  18.5 to 24.9 is normal.  A BMI of 25 to 29.9 is considered overweight.  A BMI of 30 and above is considered obese.  Maintain normal blood lipids and cholesterol by exercising and minimizing your intake of saturated fat. Eat a balanced diet with plenty of fruits and vegetables. Blood tests for lipids and cholesterol should begin at age 65 and be repeated every 5 years. If your lipid or cholesterol levels are high, you are over 50, or you are at high risk for heart disease, you may need your cholesterol levels checked more frequently.Ongoing high lipid and cholesterol levels should be treated with medicines, if diet and exercise are not working.  If you smoke, find out from your health care provider how to quit. If you do not use tobacco, do not start.  Lung cancer screening is recommended for adults aged 56 80 years who are at high risk for developing lung cancer because of a history of smoking. A yearly low-dose CT scan of the lungs is recommended for people who have at least a 30-pack-year history of smoking and are a current smoker or have quit within the past 15 years. A pack year of smoking is smoking an average of 1 pack of cigarettes a day for 1 year (for example, a 30-pack-year history of smoking could mean smoking 1 pack a day for 30 years or 2 packs a day for 15 years). Yearly screening should continue until the smoker has stopped smoking for at least 15 years. Yearly screening should be stopped for people who develop a health problem that would prevent them from having lung cancer treatment.  If you choose to drink alcohol, do not have more than 2 drinks per day. One drink is considered to be 12 oz (360 mL) of beer, 5 oz (150 mL) of wine, or 1.5 oz (45 mL) of liquor.  Avoid use of street drugs. Do not share needles with anyone. Ask for help if you need support or instructions about stopping the use of drugs.  High blood pressure causes heart disease and increases the risk of stroke. Blood  pressure should be checked at least every 1 2 years. Ongoing high blood pressure should be treated with medicines if weight loss and exercise are not effective.  If you are 60 62 years old, ask your health care provider if you should take aspirin to prevent heart disease.  Diabetes screening involves taking a blood sample to check your fasting blood sugar level. This should be done once every 3 years after age 10, if you are at a normal weight and without risk factors for diabetes. Testing should be considered at a younger age or be carried out more frequently if you are overweight and have at least 1 risk factor for diabetes.  Colorectal cancer can be detected and often prevented. Most routine colorectal cancer screening begins at the age of 30 and continues through age 75. However, your health care provider may recommend screening at an earlier age if you have risk factors for colon cancer. On a yearly basis, your health care provider may provide home test kits to check for hidden blood in the stool. A small camera at the  end of a tube may be used to directly examine the colon (sigmoidoscopy or colonoscopy) to detect the earliest forms of colorectal cancer. Talk to your health care provider about this at age 80, when routine screening begins. A direct exam of the colon should be repeated every 5 10 years through age 50, unless early forms of pre-cancerous polyps or small growths are found.  People who are at an increased risk for hepatitis B should be screened for this virus. You are considered at high risk for hepatitis B if:  You were born in a country where hepatitis B occurs often. Talk with your health care provider about which countries are considered high-risk.  Your parents were born in a high-risk country and you have not received a shot to protect against hepatitis B (hepatitis B vaccine).  You have HIV or AIDS.  You use needles to inject street drugs.  You live with, or have sex with,  someone who has hepatitis B.  You are a man who has sex with other men (MSM).  You get hemodialysis treatment.  You take certain medicines for conditions like cancer, organ transplantation, and autoimmune conditions.  Hepatitis C blood testing is recommended for all people born from [birth year] through 1964 and any individual with known risk factors for hepatitis C.  Healthy men should no longer receive prostate-specific antigen (PSA) blood tests as part of routine cancer screening. Talk to your health care provider about prostate cancer screening.  Testicular cancer screening is not recommended for adolescents or adult males who have no symptoms. Screening includes self-exam, a health care provider exam, and other screening tests. Consult with your health care provider about any symptoms you have or any concerns you have about testicular cancer.  Practice safe sex. Use condoms and avoid high-risk sexual practices to reduce the spread of sexually transmitted infections (STIs).  Use sunscreen. Apply sunscreen liberally and repeatedly throughout the day. You should seek shade when your shadow is shorter than you. Protect yourself by wearing long sleeves, pants, a wide-brimmed hat, and sunglasses year round, whenever you are outdoors.  Tell your health care provider of new moles or changes in moles, especially if there is a change in shape or color. Also tell your provider if a mole is larger than the size of a pencil eraser.  A one-time screening for abdominal aortic aneurysm (AAA) and surgical repair of large AAAs by ultrasound is recommended for men aged 43 75 years who are current or former smokers.  Stay current with your vaccines (immunizations). Document Released: 12/07/2007 Document Revised: 03/31/2013 Document Reviewed: 11/05/2010 [facility] Patient Information 2014 [place], [place].

## 2013-09-30 NOTE — Assessment & Plan Note (Signed)
He has reached his LDL goal

## 2013-09-30 NOTE — Assessment & Plan Note (Signed)
Exam done Labs ordered Vaccines were reviewed and documented Pt ed material was given

## 2013-10-02 LAB — HEPATITIS C ANTIBODY: HCV Ab: NEGATIVE

## 2013-10-05 ENCOUNTER — Ambulatory Visit
Admission: RE | Admit: 2013-10-05 | Discharge: 2013-10-05 | Disposition: A | Discharge status: Home / Self Care | Payer: BC Managed Care – PPO | Source: Ambulatory Visit | Attending: Internal Medicine | Admitting: Internal Medicine

## 2013-10-05 ENCOUNTER — Other Ambulatory Visit: Payer: Self-pay | Admitting: Internal Medicine

## 2013-10-05 ENCOUNTER — Encounter: Payer: Self-pay | Admitting: Internal Medicine

## 2013-10-05 DIAGNOSIS — I714 Abdominal aortic aneurysm, without rupture, unspecified: Secondary | ICD-10-CM

## 2013-11-04 ENCOUNTER — Other Ambulatory Visit: Payer: Self-pay | Admitting: Cardiology

## 2014-01-01 ENCOUNTER — Other Ambulatory Visit: Payer: Self-pay | Admitting: Cardiology

## 2014-01-22 ENCOUNTER — Ambulatory Visit (INDEPENDENT_AMBULATORY_CARE_PROVIDER_SITE_OTHER): Payer: BC Managed Care – PPO | Admitting: Physician Assistant

## 2014-01-22 VITALS — BP 132/78 | HR 68 | Temp 98.3°F | Resp 16 | Ht 73.0 in | Wt 222.0 lb

## 2014-01-22 DIAGNOSIS — B029 Zoster without complications: Secondary | ICD-10-CM

## 2014-01-22 DIAGNOSIS — J340 Abscess, furuncle and carbuncle of nose: Secondary | ICD-10-CM

## 2014-01-22 DIAGNOSIS — J3489 Other specified disorders of nose and nasal sinuses: Secondary | ICD-10-CM

## 2014-01-22 MED ORDER — VALACYCLOVIR HCL 1 G PO TABS: 1.0000 g | ORAL_TABLET | Freq: Three times a day (TID) | ORAL | Status: DC

## 2014-01-22 MED ORDER — CEPHALEXIN 500 MG PO CAPS: 500.0000 mg | ORAL_CAPSULE | Freq: Three times a day (TID) | ORAL | Status: DC

## 2014-01-22 MED ORDER — HYDROCODONE-ACETAMINOPHEN 7.5-325 MG PO TABS: 1.0000 | ORAL_TABLET | Freq: Four times a day (QID) | ORAL | Status: DC | PRN

## 2014-01-22 MED ORDER — PREGABALIN 150 MG PO CAPS: 150.0000 mg | ORAL_CAPSULE | Freq: Two times a day (BID) | ORAL | Status: DC

## 2014-01-22 NOTE — Patient Instructions (Signed)
Keflex - for the rash/spot on your nose Valtrex - for the viral infection - you will be on this for 10 days to help stop the virus Lyrica - this is for the nerve pain - you will be on this for at least 2 weeks - if the pain continues we will increase the dose Norco/hydrocodone - take this for the pain as needed

## 2014-01-22 NOTE — Progress Notes (Signed)
   Subjective:    Patient ID: Willie Black, male    DOB: 1951-04-25, 63 y.o.   MRN: [id number]  HPI Pt presents to clinic with about a week h/o pain on his right rib cage without injury known.  Then 2 days ago he started to notice a blistering rash in the area where he was having pain. Over the last 3-4 days he is starting to have similar pain as his rib cage on his right arm.  This am he noticed that he had a red spot when his nose where the pad of his glasses sit on the right side and it hurts to where he cannot wear his glasses.  He has been using his wife hydrocodone for the pain and it is not helping that much.  He is not sleeping well because of the pain.  Review of Systems  Constitutional: Negative.   HENT: Negative.   Skin: Positive for rash.       Objective:   Physical Exam  Vitals reviewed. Constitutional: He is oriented to person, place, and time. He appears well-developed and well-nourished.  HENT:  Head: Normocephalic and atraumatic.  Right Ear: External ear normal.  Left Ear: External ear normal.  Pulmonary/Chest: Effort normal.  Lymphadenopathy:    He has cervical adenopathy.       Right cervical: Superficial cervical (TTP) adenopathy present. No deep cervical and no posterior cervical adenopathy present.      Left cervical: No superficial cervical, no deep cervical and no posterior cervical adenopathy present.  Neurological: He is alert and oriented to person, place, and time.  Skin: Skin is warm and dry. Rash noted. Rash is vesicular (on right T8 dermatome on anterior and posterior chest).     Psychiatric: He has a normal mood and affect. His behavior is normal. Judgment and thought content normal.       Assessment & Plan:  Shingles - Plan: valACYclovir (VALTREX) 1000 MG tablet, HYDROcodone-acetaminophen (NORCO) 7.5-325 MG per tablet, pregabalin (LYRICA) 150 MG capsule - due to significant pain prior the rash we will start Lyrica at this time as well as  Norco.  He can call for a refill of the Norco before 2 weeks and if he is not better in 2 weeks he needs a recheck  Cellulitis of nose - Plan: cephALEXin (KEFLEX) 500 MG capsule - this does not appear to be a shingles lesion - it is at the eyeglass nose pad area and there are no vesicles and it is not at a nearby dermatome.  Windell Hummingbird PA-C  [facility] 01/22/2014 10:19 AM

## 2014-02-09 ENCOUNTER — Telehealth: Payer: Self-pay

## 2014-02-09 NOTE — Telephone Encounter (Signed)
OK to allow this to wait for Dr Ronnald Ramp recommendation  Please inform pt

## 2014-02-09 NOTE — Telephone Encounter (Signed)
Received pharmacy rejection stating that insurance will not cover Jublia without a prior authorization. Preferred alternatives are ciclopirox, fluconazole, or terbinafin. Please advise if you want to proceed with PA or switch medication. PCP out of the office for the rest of the week. Thanks

## 2014-02-10 NOTE — Telephone Encounter (Signed)
Patient notified, now pending MD response.

## 2014-02-10 NOTE — Telephone Encounter (Signed)
You can cancel the jublia Will not treat for now as I have not seen him in 4 months

## 2014-03-01 ENCOUNTER — Other Ambulatory Visit: Payer: Self-pay | Admitting: Cardiology

## 2014-03-07 ENCOUNTER — Telehealth: Payer: Self-pay | Admitting: *Deleted

## 2014-03-07 ENCOUNTER — Telehealth: Payer: Self-pay | Admitting: Cardiology

## 2014-03-07 DIAGNOSIS — R030 Elevated blood-pressure reading, without diagnosis of hypertension: Secondary | ICD-10-CM

## 2014-03-07 DIAGNOSIS — E7800 Pure hypercholesterolemia, unspecified: Secondary | ICD-10-CM

## 2014-03-07 DIAGNOSIS — IMO0001 Reserved for inherently not codable concepts without codable children: Secondary | ICD-10-CM

## 2014-03-07 NOTE — Telephone Encounter (Signed)
error 

## 2014-03-07 NOTE — Telephone Encounter (Signed)
Returned call to patient Dr.Jordan out of office this week,will ask him 03/15/14 if he can change to a less expensive statin.

## 2014-03-07 NOTE — Telephone Encounter (Signed)
Patient called and wanted to see if the crestor could be changed to a generic statin that would be more affordable. He is aware that he is overdue for an appointment and was transferred to scheduling to set that up. Please advise on med change. Thanks, MI

## 2014-03-18 MED ORDER — ATORVASTATIN CALCIUM 40 MG PO TABS: 40.0000 mg | ORAL_TABLET | Freq: Every day | ORAL | Status: DC

## 2014-03-18 NOTE — Telephone Encounter (Signed)
Returned call to patient Dr.Jordan advised stop crestor,start atorvastatin 40 mg daily.Fasting lab bmet,lipid,liver panels in 3 months.Lab order mailed.Advised to keep follow up appointment with Dr.Jordan 04/19/14 at 10:30 am.

## 2014-03-21 ENCOUNTER — Telehealth: Payer: Self-pay | Admitting: Cardiology

## 2014-03-21 NOTE — Telephone Encounter (Signed)
Stay off lipitor for one week then resume at 10 mg daily. See if he can tolerate lower dose.  Willie Black [place] MD, [facility]

## 2014-03-21 NOTE — Telephone Encounter (Signed)
New message     Pt was switched to generic crestor last Friday.  Ever since then he has joint pain, leg pain, feels bad.  He said he cannot take this medication.  Please call

## 2014-03-21 NOTE — Telephone Encounter (Signed)
Returned call to patient he stated he cannot take atorvastatin 40 mg.Stated he could not sleep last night joint pain,leg pain.Stated he did not have pain with crestor 10 mg.Stated crestor too expensive.Message sent to [facility] for advice.

## 2014-03-21 NOTE — Telephone Encounter (Signed)
Returned call to patient Dr.Jordan advised stay off lipitor for 1 week then resume 10 mg daily.Advised to call back if cannot tolerate.

## 2014-03-21 NOTE — Addendum Note (Signed)
Addended by: Golden Hurter D on: 03/21/2014 02:36 PM   Modules accepted: Orders, Medications

## 2014-04-04 ENCOUNTER — Other Ambulatory Visit: Payer: Self-pay

## 2014-04-04 MED ORDER — METOPROLOL TARTRATE 25 MG PO TABS: ORAL_TABLET | ORAL | Status: DC

## 2014-04-19 ENCOUNTER — Encounter: Payer: Self-pay | Admitting: Cardiology

## 2014-04-19 ENCOUNTER — Other Ambulatory Visit: Payer: Self-pay

## 2014-04-19 ENCOUNTER — Telehealth: Payer: Self-pay

## 2014-04-19 ENCOUNTER — Ambulatory Visit (INDEPENDENT_AMBULATORY_CARE_PROVIDER_SITE_OTHER): Payer: BC Managed Care – PPO | Admitting: Cardiology

## 2014-04-19 VITALS — BP 114/76 | HR 61 | Ht 74.0 in | Wt 229.8 lb

## 2014-04-19 DIAGNOSIS — I714 Abdominal aortic aneurysm, without rupture, unspecified: Secondary | ICD-10-CM

## 2014-04-19 DIAGNOSIS — I493 Ventricular premature depolarization: Secondary | ICD-10-CM | POA: Insufficient documentation

## 2014-04-19 DIAGNOSIS — E7800 Pure hypercholesterolemia, unspecified: Secondary | ICD-10-CM

## 2014-04-19 DIAGNOSIS — I2510 Atherosclerotic heart disease of native coronary artery without angina pectoris: Secondary | ICD-10-CM

## 2014-04-19 NOTE — Telephone Encounter (Signed)
Spoke to patient tier exception for crestor faxed to [facility] at fax # [phone].

## 2014-04-19 NOTE — Progress Notes (Signed)
Willie Black Date of Birth: 1951/02/16 Medical Record #[id number]  History of Present Illness: Mr. Prichett is seen today for a follow up visit. He has a history of symptomatic PVCs. He also has a history of coronary disease and is status post CABG in December of 2011. He reports he is doing much better since we reduced his metoprolol. He is less fatigued. He is not tolerating lipitor even at 2.5 mg daily. Was previously on Crestor with good control and tolerated this well. Had to change due to insurance coverage. He has no palpitations or chest pain. Stays very active now. Was sidelined with a bad case of shingles and he attributes this to his weight gain of 9 lbs.  Current Outpatient Prescriptions on File Prior to Visit  Medication Sig Dispense Refill  . aspirin 81 MG tablet Take 81 mg by mouth daily.        Marland Kitchen atorvastatin (LIPITOR) 10 MG tablet Take 2.5 mg by mouth daily.   30 tablet  6  . Calcium Carbonate-Vitamin D (CALCIUM + D PO) Take by mouth daily.        . metoprolol tartrate (LOPRESSOR) 25 MG tablet TAKE 1 TABLET (25 MG TOTAL) BY MOUTH 3 (THREE) TIMES DAILY.  90 tablet  0  . omeprazole-sodium bicarbonate (ZEGERID) 40-1100 MG per capsule Take 1 capsule by mouth daily.         No current facility-administered medications on file prior to visit.    Allergies  Allergen Reactions  . Penicillins Rash    Ulcers in the mouth    Past Medical History  Diagnosis Date  . GERD (gastroesophageal reflux disease)   . Renal calculi   . Hypercholesteremia   . CAD (coronary artery disease)     S/P prior CABG  x 3 in December 2011  . PVC (premature ventricular contraction)     Bigeminy PVC's on event monitor Nov 2012  . Anxiety   . Shingles     Past Surgical History  Procedure Laterality Date  . Coronary artery bypass graft  05/28/2110    x3 with LIMA to LAD, SVG to OM and SVG to LAD after having ST elevation with coming off pump  . Cervical disc surgery      History   Smoking status  . Former Smoker  . Types: Cigarettes  . Quit date: 04/27/2009  Smokeless tobacco  . Never Used    History  Alcohol Use No    Family History  Problem Relation Age of Onset  . Aneurysm Father     abdominal  . Coronary artery disease Father     3 stents    Review of Systems: As noted in history of present illness. All other systems were reviewed and are negative.  Physical Exam: BP 114/76  Pulse 61  Ht 6\' 2"  (1.88 m)  Wt 229 lb 12.8 oz (104.237 kg)  BMI 29.49 kg/m2 Patient is alert and in no acute distress.   Skin is warm and dry. Color is normal.  HEENT is unremarkable. Normocephalic/atraumatic. PERRL. Sclera are nonicteric. Neck is supple. No masses. No JVD. Lungs are clear. Cardiac exam shows a regular rate and rhythm. Normal S1-2. No gallop or murmur. Abdomen is soft. Extremities are without edema. Gait and ROM are intact. No gross neurologic deficits noted.   LABORATORY DATA:  ECG today demonstrates NSR with a rate of 61 beats per minute. It is otherwise normal. I have personally reviewed and interpreted this study.  Lab  Results  Component Value Date   WBC 4.6 09/30/2013   HGB 14.0 09/30/2013   HCT 41.1 09/30/2013   PLT 175.0 09/30/2013   GLUCOSE 94 09/30/2013   CHOL 124 09/30/2013   TRIG 57.0 09/30/2013   HDL 47.80 09/30/2013   LDLCALC 65 09/30/2013   ALT 19 09/30/2013   AST 19 09/30/2013   NA 141 09/30/2013   K 4.3 09/30/2013   CL 104 09/30/2013   CREATININE 1.0 09/30/2013   BUN 15 09/30/2013   CO2 30 09/30/2013   TSH 1.04 09/30/2013   PSA 0.45 09/30/2013   INR 0.97 11/22/2010     Assessment / Plan: 1. Coronary disease status post CABG in December 2011. He is asymptomatic. Continue risk factor modification.  2. PVCs. Minimally symptomatic. Continue current dose of metoprolol.  3. Hyperlipidemia. Intolerant to lipitor even at very low dose. Did very well with Crestor before. Will query his insurance for an exception to resume Crestor 10 mg daily. If not then we could  try a different statin but the Crestor worked very well.

## 2014-04-19 NOTE — Patient Instructions (Signed)
We will try and get you back on Crestor 10 mg daily. Stop lipitor.  I will see you in 6 months

## 2014-05-23 ENCOUNTER — Telehealth: Payer: Self-pay | Admitting: Cardiology

## 2014-05-23 NOTE — Telephone Encounter (Signed)
New message      Want samples of crestor.  Did you call the ins company to get them to pay for the presc?

## 2014-05-23 NOTE — Telephone Encounter (Signed)
Returned call to patient no answer.Left message on personal voice mail office out of crestor.Tier exception for crestor faxed to [facility] on 10.27/15.Advised to call back if needed.

## 2014-05-23 NOTE — Telephone Encounter (Signed)
Pt is returning Willie Black's call in reference to some meds. Please call  Thanks

## 2014-05-24 MED ORDER — ROSUVASTATIN CALCIUM 10 MG PO TABS: 10.0000 mg | ORAL_TABLET | Freq: Every day | ORAL | Status: DC

## 2014-05-24 NOTE — Telephone Encounter (Signed)
Received a call back from patient he stated he needed a refill for crestor 10 mg.Refill sent to pharmacy.Tier reduction for crestor faxed to [facility] 04/19/14.Patient will call [employer].

## 2014-05-24 NOTE — Telephone Encounter (Signed)
Returned call to patient no answer.LMTC. 

## 2014-06-09 ENCOUNTER — Other Ambulatory Visit: Payer: Self-pay | Admitting: *Deleted

## 2014-06-09 MED ORDER — METOPROLOL TARTRATE 25 MG PO TABS: ORAL_TABLET | ORAL | Status: DC

## 2014-06-21 LAB — HEPATIC FUNCTION PANEL
ALT: 14 U/L (ref 0–53)
AST: 17 U/L (ref 0–37)
Albumin: 4.2 g/dL (ref 3.5–5.2)
Alkaline Phosphatase: 68 U/L (ref 39–117)
Bilirubin, Direct: 0.1 mg/dL (ref 0.0–0.3)
Indirect Bilirubin: 0.4 mg/dL (ref 0.2–1.2)
Total Bilirubin: 0.5 mg/dL (ref 0.2–1.2)
Total Protein: 6.7 g/dL (ref 6.0–8.3)

## 2014-06-21 LAB — LIPID PANEL
Cholesterol: 159 mg/dL (ref 0–200)
HDL: 54 mg/dL (ref >39)
LDL Cholesterol: 88 mg/dL (ref 0–99)
Total CHOL/HDL Ratio: 2.9 Ratio
Triglycerides: 83 mg/dL (ref <150)
VLDL: 17 mg/dL (ref 0–40)

## 2015-01-19 ENCOUNTER — Other Ambulatory Visit: Payer: Self-pay | Admitting: Cardiology

## 2015-01-19 MED ORDER — METOPROLOL TARTRATE 25 MG PO TABS: ORAL_TABLET | ORAL | Status: DC

## 2015-01-20 ENCOUNTER — Other Ambulatory Visit: Payer: Self-pay

## 2015-01-20 MED ORDER — METOPROLOL TARTRATE 25 MG PO TABS: ORAL_TABLET | ORAL | Status: DC

## 2015-05-09 ENCOUNTER — Other Ambulatory Visit: Payer: Self-pay | Admitting: Cardiology

## 2015-05-10 NOTE — Telephone Encounter (Signed)
Rx(s) sent to pharmacy electronically.  

## 2015-05-10 NOTE — Telephone Encounter (Signed)
°*  STAT* If patient is at the pharmacy, call can be transferred to refill team.   1. Which medications need to be refilled? (please list name of each medication and dose if known) Metoprolol  2. Which pharmacy/location (including street and city if local pharmacy) is medication to be sent to?[phone]  3. Do they need a 30 day or 90 day supply? 90 and refills

## 2015-05-26 ENCOUNTER — Encounter: Payer: Self-pay | Admitting: Family

## 2015-05-26 ENCOUNTER — Ambulatory Visit (INDEPENDENT_AMBULATORY_CARE_PROVIDER_SITE_OTHER): Payer: BLUE CROSS/BLUE SHIELD | Admitting: Family

## 2015-05-26 VITALS — BP 140/86 | HR 57 | Temp 98.1°F | Resp 18 | Ht 74.0 in | Wt 237.0 lb

## 2015-05-26 DIAGNOSIS — Z23 Encounter for immunization: Secondary | ICD-10-CM

## 2015-05-26 DIAGNOSIS — H9313 Tinnitus, bilateral: Secondary | ICD-10-CM | POA: Insufficient documentation

## 2015-05-26 DIAGNOSIS — L218 Other seborrheic dermatitis: Secondary | ICD-10-CM

## 2015-05-26 DIAGNOSIS — L219 Seborrheic dermatitis, unspecified: Secondary | ICD-10-CM | POA: Insufficient documentation

## 2015-05-26 MED ORDER — ZOSTER VACCINE LIVE 19400 UNT/0.65ML ~~LOC~~ SOLR: 0.6500 mL | Freq: Once | SUBCUTANEOUS | Status: DC

## 2015-05-26 MED ORDER — FLUOCINOLONE ACETONIDE 0.01 % EX SHAM: 1.0000 [oz_av] | MEDICATED_SHAMPOO | Freq: Every day | CUTANEOUS | Status: DC

## 2015-05-26 NOTE — Assessment & Plan Note (Signed)
Symptoms and exam consistent with dermatitis of the scalp that is resistant to over the counter shampoo. Start fluocinolone shampoo. Follow up if symptoms worsen or fail to improve.

## 2015-05-26 NOTE — Patient Instructions (Addendum)
Thank you for choosing [facility].  Summary/Instructions:  Your prescription(s) have been submitted to your pharmacy or been printed and provided for you. Please take as directed and contact our office if you believe you are having problem(s) with the medication(s) or have any questions.  If your symptoms worsen or fail to improve, please contact our office for further instruction, or in case of emergency go directly to the emergency room at the closest medical facility.   Murine wax removal kit or other similar.

## 2015-05-26 NOTE — Assessment & Plan Note (Signed)
Discussed benefits and risk of vaccination. All questions answered to patient's satisfaction. Prescription for Zostavax printed and provided to patient.

## 2015-05-26 NOTE — Progress Notes (Signed)
Pre visit review using our clinic review tool, if applicable. No additional management support is needed unless otherwise documented below in the visit note. 

## 2015-05-26 NOTE — Progress Notes (Signed)
Subjective:    Patient ID: Willie Black, male    DOB: 11-27-1950, 64 y.o.   MRN: [id number]  Chief Complaint  Patient presents with  . Tinnitus    has ringing in the ears thats high pitched and has sores on his head since coming back from the beach, shingles shot?    HPI:  Willie Black is a 64 y.o. male who  has a past medical history of GERD (gastroesophageal reflux disease); Renal calculi; Hypercholesteremia; CAD (coronary artery disease); PVC (premature ventricular contraction); Anxiety; and Shingles. and presents today for an acute office visit.   1.) Ringing in ears - This is a new problem. Associated symptom of ringing in his bilateral ears has been going on for about 3 months following a trip back from the beach. Described as high pitched sounds. Timing of the symptoms waxes and wanes. Denies modifying factors or treatments that make it better or worse. Denies any trauma to his ears, congestion, or symptoms of illness. Does work in [employer] and not needed for where he works. He does also shoot guns but has not since prior to this starting.   2.) Sore on head - This is a new problem. Assocated symptom of a sores located on his head have been going on for about 3 months.  Started on the left side of his head and moved to the right side of his head. There is some itchiness and there has been bleeding after he itches it. Modifying factors include Head and Shoulders shampoo that may have helped a little.   3.) Shingles vaccination - Requesting information and possible prescription for Shingles vaccination.    Allergies  Allergen Reactions  . Penicillins Rash    Ulcers in the mouth    Current Outpatient Prescriptions on File Prior to Visit  Medication Sig Dispense Refill  . aspirin 81 MG tablet Take 81 mg by mouth daily.      . Calcium Carbonate-Vitamin D (CALCIUM + D PO) Take by mouth daily.      . metoprolol tartrate (LOPRESSOR) 25 MG tablet Take 1  tablet (25 mg total) by mouth 3 (three) times daily. NEEDS APPOINTMENT FOR FUTURE REFILLS 90 tablet 0  . omeprazole-sodium bicarbonate (ZEGERID) 40-1100 MG per capsule Take 1 capsule by mouth daily.      . rosuvastatin (CRESTOR) 10 MG tablet Take 1 tablet (10 mg total) by mouth daily. 30 tablet 6   No current facility-administered medications on file prior to visit.    Review of Systems  Constitutional: Negative for fever and chills.  HENT: Positive for tinnitus. Negative for ear discharge and ear pain.   Skin: Positive for rash.      Objective:    BP 140/86 mmHg  Pulse 57  Temp(Src) 98.1 F (36.7 C) (Oral)  Resp 18  Ht 6\' 2"  (1.88 m)  Wt 237 lb (107.502 kg)  BMI 30.42 kg/m2  SpO2 98% Nursing note and vital signs reviewed.  Physical Exam  Constitutional: He is oriented to person, place, and time. He appears well-developed and well-nourished. No distress.  HENT:  Pinkish red rash with scales and flakes noted across scalp. No discharge present.   Bilateral ears - impacted cerumen noted.   Cardiovascular: Normal rate, regular rhythm, normal heart sounds and intact distal pulses.   Pulmonary/Chest: Effort normal and breath sounds normal.  Neurological: He is alert and oriented to person, place, and time.  Skin: Skin is warm and dry.  Psychiatric:  He has a normal mood and affect. His behavior is normal. Judgment and thought content normal.       Assessment & Plan:   Problem List Items Addressed This Visit      Musculoskeletal and Integument   Seborrheic dermatitis of scalp - Primary    Symptoms and exam consistent with dermatitis of the scalp that is resistant to over the counter shampoo. Start fluocinolone shampoo. Follow up if symptoms worsen or fail to improve.       Relevant Medications   Fluocinolone Acetonide (CAPEX) 0.01 % SHAM     Other   Tinnitus    Tinnitus of undetermined origin although risk factors include place of employment and shoot guns. Impacted  cerumen cleaned and tolerated with mild dizziness, however symptoms of tinnitus remained. No evidence of infection. Refer to ENT and possibly Audiology for further assessment.       Relevant Medications   Fluocinolone Acetonide (CAPEX) 0.01 % SHAM   Other Relevant Orders   Ambulatory referral to ENT   Need for shingles vaccine    Discussed benefits and risk of vaccination. All questions answered to patient's satisfaction. Prescription for Zostavax printed and provided to patient.       Relevant Medications   zoster vaccine live, PF, (ZOSTAVAX) 09811 UNT/0.65ML injection

## 2015-05-26 NOTE — Assessment & Plan Note (Signed)
Tinnitus of undetermined origin although risk factors include place of employment and shoot guns. Impacted cerumen cleaned and tolerated with mild dizziness, however symptoms of tinnitus remained. No evidence of infection. Refer to ENT and possibly Audiology for further assessment.

## 2015-06-05 ENCOUNTER — Other Ambulatory Visit: Payer: Self-pay | Admitting: Cardiology

## 2015-06-07 ENCOUNTER — Other Ambulatory Visit: Payer: Self-pay | Admitting: Cardiology

## 2015-07-07 ENCOUNTER — Other Ambulatory Visit: Payer: Self-pay | Admitting: Cardiology

## 2015-08-09 ENCOUNTER — Other Ambulatory Visit: Payer: Self-pay | Admitting: Cardiology

## 2015-08-09 NOTE — Telephone Encounter (Signed)
Rx request sent to pharmacy.  

## 2015-08-10 ENCOUNTER — Other Ambulatory Visit: Payer: Self-pay | Admitting: *Deleted

## 2015-08-10 MED ORDER — ROSUVASTATIN CALCIUM 10 MG PO TABS: 10.0000 mg | ORAL_TABLET | Freq: Every day | ORAL | Status: DC

## 2015-08-21 ENCOUNTER — Encounter: Payer: Self-pay | Admitting: Cardiology

## 2015-08-21 ENCOUNTER — Ambulatory Visit (INDEPENDENT_AMBULATORY_CARE_PROVIDER_SITE_OTHER): Payer: Managed Care, Other (non HMO) | Admitting: Cardiology

## 2015-08-21 VITALS — BP 118/78 | HR 60 | Ht 74.0 in | Wt 247.1 lb

## 2015-08-21 DIAGNOSIS — I2510 Atherosclerotic heart disease of native coronary artery without angina pectoris: Secondary | ICD-10-CM

## 2015-08-21 DIAGNOSIS — E7800 Pure hypercholesterolemia, unspecified: Secondary | ICD-10-CM | POA: Diagnosis not present

## 2015-08-21 DIAGNOSIS — I493 Ventricular premature depolarization: Secondary | ICD-10-CM

## 2015-08-21 NOTE — Progress Notes (Signed)
Willie Black Date of Birth: [birth year] Medical Record [id number]  History of Present Illness: Mr. Delfavero is seen today for follow up CAD. He has a history of symptomatic PVCs. He also has a history of coronary disease and is status post CABG in December of 2011. He was intolerant to low dose lipitor.  Tolerating low dose Crestor well but notes some increased muscle aches when switched to generic.   He has no palpitations or chest pain. He has been under increased stress with the terminal illness and passing of his mother in [name]. He has been less active and has gained 10 lbs.   Current Outpatient Prescriptions on File Prior to Visit  Medication Sig Dispense Refill  . aspirin 81 MG tablet Take 81 mg by mouth daily.      . Calcium Carbonate-Vitamin D (CALCIUM + D PO) Take by mouth daily.      . metoprolol tartrate (LOPRESSOR) 25 MG tablet Take 1 tablet (25 mg total) by mouth 3 (three) times daily. 90 tablet 0  . omeprazole-sodium bicarbonate (ZEGERID) 40-1100 MG per capsule Take 1 capsule by mouth daily.       No current facility-administered medications on file prior to visit.    Allergies  Allergen Reactions  . Penicillins Rash    Ulcers in the mouth    Past Medical History  Diagnosis Date  . GERD (gastroesophageal reflux disease)   . Renal calculi   . Hypercholesteremia   . CAD (coronary artery disease)     S/P prior CABG  x 3 in December 2011  . PVC (premature ventricular contraction)     Bigeminy PVC's on event monitor Nov 2012  . Anxiety   . Shingles     Past Surgical History  Procedure Laterality Date  . Coronary artery bypass graft  05/28/2110    x3 with LIMA to LAD, SVG to OM and SVG to LAD after having ST elevation with coming off pump  . Cervical disc surgery      History  Smoking status  . Former Smoker  . Types: Cigarettes  . Quit date: 04/27/2009  Smokeless tobacco  . Never Used    History  Alcohol Use No    Family History  Problem  Relation Age of Onset  . Aneurysm Father     abdominal  . Coronary artery disease Father     3 stents    Review of Systems: As noted in history of present illness. All other systems were reviewed and are negative.  Physical Exam: BP 118/78 mmHg  Pulse 60  Ht 6\' 2"  (1.88 m)  Wt 112.084 kg (247 lb 1.6 oz)  BMI 31.71 kg/m2 Patient is alert and in no acute distress.   Skin is warm and dry. Color is normal.  HEENT is unremarkable. Normocephalic/atraumatic. PERRL. Sclera are nonicteric. Neck is supple. No masses. No JVD. Lungs are clear. Cardiac exam shows a regular rate and rhythm. Normal S1-2. No gallop or murmur. Abdomen is soft. Extremities are without edema. Gait and ROM are intact. No gross neurologic deficits noted.   LABORATORY DATA:  ECG today demonstrates NSR with a rate of 60 beats per minute. LAD. It is otherwise normal. I have personally reviewed and interpreted this study.  Lab Results  Component Value Date   WBC 4.6 09/30/2013   HGB 14.0 09/30/2013   HCT 41.1 09/30/2013   PLT 175.0 09/30/2013   GLUCOSE 94 09/30/2013   CHOL 159 06/20/2014   TRIG 83 06/20/2014  HDL 54 06/20/2014   LDLCALC 88 06/20/2014   ALT 14 06/20/2014   AST 17 06/20/2014   NA 141 09/30/2013   K 4.3 09/30/2013   CL 104 09/30/2013   CREATININE 1.0 09/30/2013   BUN 15 09/30/2013   CO2 30 09/30/2013   TSH 1.04 09/30/2013   PSA 0.45 09/30/2013   INR 0.97 11/22/2010     Assessment / Plan: 1. Coronary disease status post CABG in December 2011. He is asymptomatic. Continue risk factor modification. Recommend follow up stress Myoview.   2. PVCs. Minimally symptomatic. Continue current dose of metoprolol.  3. Hyperlipidemia. Intolerant to lipitor even at very low dose. Tolerating Crestor better with some myalgias. Will check fasting lab work when he comes in for stress test.

## 2015-09-01 ENCOUNTER — Telehealth (HOSPITAL_COMMUNITY): Payer: Self-pay

## 2015-09-01 NOTE — Telephone Encounter (Signed)
UTR Encounter complete. 

## 2015-09-05 ENCOUNTER — Telehealth (HOSPITAL_COMMUNITY): Payer: Self-pay

## 2015-09-05 NOTE — Telephone Encounter (Signed)
UTR Encounter complete. 

## 2015-09-06 ENCOUNTER — Ambulatory Visit (HOSPITAL_COMMUNITY)
Admission: RE | Admit: 2015-09-06 | Discharge: 2015-09-06 | Disposition: A | Discharge status: Home / Self Care | Payer: Managed Care, Other (non HMO) | Source: Ambulatory Visit | Attending: Internal Medicine | Admitting: Internal Medicine

## 2015-09-06 DIAGNOSIS — R5383 Other fatigue: Secondary | ICD-10-CM | POA: Insufficient documentation

## 2015-09-06 DIAGNOSIS — E663 Overweight: Secondary | ICD-10-CM | POA: Insufficient documentation

## 2015-09-06 DIAGNOSIS — I493 Ventricular premature depolarization: Secondary | ICD-10-CM | POA: Diagnosis not present

## 2015-09-06 DIAGNOSIS — Z6831 Body mass index (BMI) 31.0-31.9, adult: Secondary | ICD-10-CM | POA: Diagnosis not present

## 2015-09-06 DIAGNOSIS — R42 Dizziness and giddiness: Secondary | ICD-10-CM | POA: Diagnosis not present

## 2015-09-06 DIAGNOSIS — E7800 Pure hypercholesterolemia, unspecified: Secondary | ICD-10-CM | POA: Diagnosis not present

## 2015-09-06 DIAGNOSIS — Z8249 Family history of ischemic heart disease and other diseases of the circulatory system: Secondary | ICD-10-CM | POA: Insufficient documentation

## 2015-09-06 DIAGNOSIS — I2510 Atherosclerotic heart disease of native coronary artery without angina pectoris: Secondary | ICD-10-CM | POA: Diagnosis not present

## 2015-09-06 DIAGNOSIS — Z87891 Personal history of nicotine dependence: Secondary | ICD-10-CM | POA: Diagnosis not present

## 2015-09-06 LAB — MYOCARDIAL PERFUSION IMAGING
Estimated workload: 10.1 METS
Exercise duration (min): 9 min
LV dias vol: 91 mL (ref 62–150)
LV sys vol: 34 mL
MPHR: 151 {beats}/min
Peak HR: 160 {beats}/min
Percent HR: 101 %
RPE: 16
Rest HR: 85 {beats}/min
SDS: 1
SRS: 0
SSS: 1
TID: 1.02

## 2015-09-06 MED ORDER — TECHNETIUM TC 99M SESTAMIBI GENERIC - CARDIOLITE
10.8000 | Freq: Once | INTRAVENOUS | Status: AC | PRN
  Administered 2015-09-06: 11 via INTRAVENOUS

## 2015-09-06 MED ORDER — TECHNETIUM TC 99M SESTAMIBI GENERIC - CARDIOLITE
29.0000 | Freq: Once | INTRAVENOUS | Status: AC | PRN
  Administered 2015-09-06: 29 via INTRAVENOUS

## 2015-09-07 ENCOUNTER — Other Ambulatory Visit: Payer: Self-pay

## 2015-09-07 LAB — LIPID PANEL
Cholesterol: 179 mg/dL (ref 125–200)
HDL: 54 mg/dL (ref >=40)
LDL Cholesterol: 113 mg/dL (ref <130)
Total CHOL/HDL Ratio: 3.3 Ratio (ref <=5.0)
Triglycerides: 58 mg/dL (ref <150)
VLDL: 12 mg/dL (ref <30)

## 2015-09-07 LAB — HEPATIC FUNCTION PANEL
ALT: 12 U/L (ref 9–46)
AST: 16 U/L (ref 10–35)
Albumin: 4.1 g/dL (ref 3.6–5.1)
Alkaline Phosphatase: 79 U/L (ref 40–115)
Bilirubin, Direct: 0.1 mg/dL (ref <=0.2)
Indirect Bilirubin: 0.5 mg/dL (ref 0.2–1.2)
Total Bilirubin: 0.6 mg/dL (ref 0.2–1.2)
Total Protein: 6.8 g/dL (ref 6.1–8.1)

## 2015-09-07 LAB — BASIC METABOLIC PANEL
BUN: 15 mg/dL (ref 7–25)
CO2: 29 mmol/L (ref 20–31)
Calcium: 9.2 mg/dL (ref 8.6–10.3)
Chloride: 101 mmol/L (ref 98–110)
Creat: 1.06 mg/dL (ref 0.70–1.25)
Glucose, Bld: 91 mg/dL (ref 65–99)
Potassium: 4.6 mmol/L (ref 3.5–5.3)
Sodium: 141 mmol/L (ref 135–146)

## 2015-09-07 MED ORDER — EZETIMIBE 10 MG PO TABS: 10.0000 mg | ORAL_TABLET | Freq: Every day | ORAL | Status: DC

## 2015-09-08 ENCOUNTER — Other Ambulatory Visit: Payer: Self-pay | Admitting: Cardiology

## 2015-09-08 NOTE — Telephone Encounter (Signed)
Rx request sent to pharmacy.  

## 2015-10-12 ENCOUNTER — Telehealth: Payer: Self-pay | Admitting: Cardiology

## 2015-10-12 NOTE — Telephone Encounter (Signed)
DJ would you be able to help with this please? Willie Black           I'm not sure what he wants recoded. It was done for CAD post CABG. I'm sure he wants it changed to screening. Not sure what to do about that.

## 2015-10-12 NOTE — Telephone Encounter (Signed)
I returned call. Patient actually had questions related to billing for a procedure. His insurance co is calling to see if some bill charges for his recent stress test can be recoded so that they can cover his costs. Otherwise patient is looking at $3200 out of pocket. Pt aware I am routing msg and requests someone follow up w/ him.

## 2015-10-12 NOTE — Telephone Encounter (Signed)
Suanne Marker would you mind taking a look at this for me please

## 2015-10-12 NOTE — Telephone Encounter (Signed)
Pt calling to discuss med changes -has questions -pls call

## 2015-10-16 NOTE — Telephone Encounter (Signed)
Test

## 2015-10-17 NOTE — Telephone Encounter (Signed)
Left a detailed messaged on patient's cell phone stating that we cannot change the billing code to screening since that was not the reason his testing was done since he already has been screened for established CAD and post CABG. I left him my direct line if he had any questions.

## 2016-01-04 ENCOUNTER — Other Ambulatory Visit: Payer: Self-pay

## 2016-01-04 MED ORDER — METOPROLOL TARTRATE 25 MG PO TABS: 25.0000 mg | ORAL_TABLET | Freq: Three times a day (TID) | ORAL | Status: DC

## 2016-02-08 ENCOUNTER — Encounter: Payer: Self-pay | Admitting: Internal Medicine

## 2016-02-08 ENCOUNTER — Ambulatory Visit (INDEPENDENT_AMBULATORY_CARE_PROVIDER_SITE_OTHER): Payer: Managed Care, Other (non HMO) | Admitting: Internal Medicine

## 2016-02-08 VITALS — BP 138/86 | HR 57 | Temp 98.4°F | Resp 16 | Ht 74.0 in | Wt 245.8 lb

## 2016-02-08 DIAGNOSIS — K130 Diseases of lips: Secondary | ICD-10-CM | POA: Diagnosis not present

## 2016-02-08 DIAGNOSIS — H9313 Tinnitus, bilateral: Secondary | ICD-10-CM

## 2016-02-08 NOTE — Patient Instructions (Signed)
Tinnitus  Tinnitus refers to hearing a sound when there is no actual source for that sound. This is often described as ringing in the ears. However, people with this condition may hear a variety of noises. A person may hear the sound in one ear or in both ears.   The sounds of tinnitus can be soft, loud, or somewhere in between. Tinnitus can last for a few seconds or can be constant for days. It may go away without treatment and come back at various times. When tinnitus is constant or happens often, it can lead to other problems, such as trouble sleeping and trouble concentrating.  Almost everyone experiences tinnitus at some point. Tinnitus that is long-lasting (chronic) or comes back often is a problem that may require medical attention.   CAUSES   The cause of tinnitus is often not known. In some cases, it can result from other problems or conditions, including:   · Exposure to loud noises from machinery, music, or other sources.  · Hearing loss.  · Ear or sinus infections.  · Earwax buildup.  · A foreign object in the ear.  · Use of certain medicines.  · Use of alcohol and caffeine.  · High blood pressure.  · Heart diseases.  · Anemia.  · Allergies.  · Meniere disease.  · Thyroid problems.  · Tumors.  · An enlarged part of a weakened blood vessel (aneurysm).  SYMPTOMS  The main symptom of tinnitus is hearing a sound when there is no source for that sound. It may sound like:   · Buzzing.  · Roaring.  · Ringing.  · Blowing air, similar to the sound heard when you listen to a seashell.  · Hissing.  · Whistling.  · Sizzling.  · Humming.  · Running water.  · A sustained musical note.  DIAGNOSIS   Tinnitus is diagnosed based on your symptoms. Your health care provider will do a physical exam. A comprehensive hearing exam (audiologic exam) will be done if your tinnitus:   · Affects only one ear (unilateral).  · Causes hearing difficulties.  · Lasts 6 months or longer.  You may also need to see a health care provider  who specializes in hearing disorders (audiologist). You may be asked to complete a questionnaire to determine the severity of your tinnitus. Tests may be done to help determine the cause and to rule out other conditions. These can include:  · Imaging studies of your head and brain, such as:    A CT scan.    An MRI.  · An imaging study of your blood vessels (angiogram).  TREATMENT   Treating an underlying medical condition can sometimes make tinnitus go away. If your tinnitus continues, other treatments may include:  · Medicines, such as certain antidepressants or sleeping aids.  · Sound generators to mask the tinnitus. These include:  ¨ Tabletop sound machines that play relaxing sounds to help you fall asleep.  ¨ Wearable devices that fit in your ear and play sounds or music.  ¨ A small device that uses headphones to deliver a signal embedded in music (acoustic neural stimulation). In time, this may change the pathways of your brain and make you less sensitive to tinnitus. This device is used for very severe cases when no other treatment is working.  · Therapy and counseling to help you manage the stress of living with tinnitus.  · Using hearing aids or cochlear implants, if your tinnitus is related to hearing   loss.  HOME CARE INSTRUCTIONS  · When possible, avoid being in loud places and being exposed to loud sounds.  · Wear hearing protection, such as earplugs, when you are exposed to loud noises.  · Do not take stimulants, such as nicotine, alcohol, or caffeine.  · Practice techniques for reducing stress, such as meditation, yoga, or deep breathing.  · Use a white noise machine, a humidifier, or other devices to mask the sound of tinnitus.  · Sleep with your head slightly raised. This may reduce the impact of tinnitus.  · Try to get plenty of rest each night.  SEEK MEDICAL CARE IF:  · You have tinnitus in just one ear.  · Your tinnitus continues for 3 weeks or longer without stopping.  · Home care measures are not  helping.  · You have tinnitus after a head injury.  · You have tinnitus along with any of the following:    Dizziness.    Loss of balance.    Nausea and vomiting.     This information is not intended to replace advice given to you by your health care provider. Make sure you discuss any questions you have with your health care provider.     Document Released: 06/10/2005 Document Revised: 07/01/2014 Document Reviewed: 11/10/2013  Elsevier Interactive Patient Education ©2016 [employer]

## 2016-02-08 NOTE — Progress Notes (Signed)
Pre visit review using our clinic review tool, if applicable. No additional management support is needed unless otherwise documented below in the visit note. 

## 2016-02-08 NOTE — Progress Notes (Signed)
Subjective:  Patient ID: Willie Black, male    DOB: February 01, 1951  Age: 65 y.o. MRN: [id number]  CC: Lip Laceration   HPI Willie Black presents for concerns about a lesion on his right lower lip for 3 weeks. It started as a hard nodule that he thought was a cold sore so he has tried Valtrex. This did not help control the lesion and over the last few weeks it has progressed into a dome-shaped lesion that bleeds easily when disrupted.  Outpatient Medications Prior to Visit  Medication Sig Dispense Refill  . aspirin 81 MG tablet Take 81 mg by mouth daily.      . Calcium Carbonate-Vitamin D (CALCIUM + D PO) Take by mouth daily.      Marland Kitchen ezetimibe (ZETIA) 10 MG tablet Take 1 tablet (10 mg total) by mouth daily. 30 tablet 6  . metoprolol tartrate (LOPRESSOR) 25 MG tablet Take 1 tablet (25 mg total) by mouth 3 (three) times daily. 90 tablet 3  . omeprazole-sodium bicarbonate (ZEGERID) 40-1100 MG per capsule Take 1 capsule by mouth daily.      . rosuvastatin (CRESTOR) 10 MG tablet Take 5 mg by mouth every other day.     No facility-administered medications prior to visit.     ROS Review of Systems  Constitutional: Negative.   HENT: Positive for tinnitus.        He complains of chronic ringing in both ears  Eyes: Negative.  Negative for visual disturbance.  Respiratory: Negative.  Negative for cough and shortness of breath.   Cardiovascular: Negative for chest pain and palpitations.  Gastrointestinal: Negative.   Endocrine: Negative.   Genitourinary: Negative.  Negative for dysuria and hematuria.  Musculoskeletal: Negative.   Skin: Negative.   Allergic/Immunologic: Negative.   Neurological: Negative.   Hematological: Negative.  Negative for adenopathy. Does not bruise/bleed easily.  Psychiatric/Behavioral: Negative.   All other systems reviewed and are negative.   Objective:  BP 138/86 (BP Location: Left Arm, Patient Position: Sitting, Cuff Size: Normal)   Pulse (!) 57    Temp 98.4 F (36.9 C) (Oral)   Resp 16   Ht 6\' 2"  (1.88 m)   Wt 245 lb 12 oz (111.5 kg)   SpO2 97%   BMI 31.55 kg/m   BP Readings from Last 3 Encounters:  02/08/16 138/86  08/21/15 118/78  05/26/15 140/86    Wt Readings from Last 3 Encounters:  02/08/16 245 lb 12 oz (111.5 kg)  09/06/15 247 lb (112 kg)  08/21/15 247 lb 1.6 oz (112.1 kg)    Physical Exam  HENT:  Right Ear: Hearing, tympanic membrane, external ear and ear canal normal.  Left Ear: Hearing, tympanic membrane, external ear and ear canal normal.  Mouth/Throat:      Lab Results  Component Value Date   WBC 4.6 09/30/2013   HGB 14.0 09/30/2013   HCT 41.1 09/30/2013   PLT 175.0 09/30/2013   GLUCOSE 91 09/06/2015   CHOL 179 09/06/2015   TRIG 58 09/06/2015   HDL 54 09/06/2015   LDLCALC 113 09/06/2015   ALT 12 09/06/2015   AST 16 09/06/2015   NA 141 09/06/2015   K 4.6 09/06/2015   CL 101 09/06/2015   CREATININE 1.06 09/06/2015   BUN 15 09/06/2015   CO2 29 09/06/2015   TSH 1.04 09/30/2013   PSA 0.45 09/30/2013   INR 0.97 11/22/2010    No results found.  Assessment & Plan:   Radlee was seen today  for lip laceration.  Diagnoses and all orders for this visit:  Lip lesion- his is a complicated vascular lesion in a delicate location, his dermatologist is Dr. Allyson Sabal, I sent an urgent referral to dermatology and asked her to be seen today. -     Ambulatory referral to Dermatology  Bilateral tinnitus- I think that he has low frequency hearing loss, I've asked him to see audiology for further evaluation. -     Ambulatory referral to Audiology   I am having Willie Black maintain his omeprazole-sodium bicarbonate, aspirin, Calcium Carbonate-Vitamin D (CALCIUM + D PO), rosuvastatin, ezetimibe, and metoprolol tartrate.  No orders of the defined types were placed in this encounter.    Follow-up: Return if symptoms worsen or fail to improve.  Scarlette Calico, MD

## 2016-02-21 ENCOUNTER — Encounter: Payer: Self-pay | Admitting: Internal Medicine

## 2016-04-08 ENCOUNTER — Other Ambulatory Visit: Payer: Self-pay

## 2016-04-08 MED ORDER — METOPROLOL TARTRATE 25 MG PO TABS: 25.0000 mg | ORAL_TABLET | Freq: Three times a day (TID) | ORAL | 3 refills | Status: DC

## 2016-05-01 ENCOUNTER — Other Ambulatory Visit: Payer: Self-pay | Admitting: Internal Medicine

## 2016-05-01 ENCOUNTER — Telehealth: Payer: Self-pay | Admitting: Internal Medicine

## 2016-05-01 DIAGNOSIS — J328 Other chronic sinusitis: Secondary | ICD-10-CM | POA: Insufficient documentation

## 2016-05-01 NOTE — Telephone Encounter (Signed)
Referral ordered

## 2016-05-01 NOTE — Telephone Encounter (Signed)
Patient states he went to his dentist and the dentist found an old root from a wisdom tooth that had been pulled lodged into his sinus cavity on the right side of his face.  Patient states he has had sharpe pain off and on on the right side of his face.  He is requesting Dr. Ronnald Ramp to refer him to an ENT.

## 2016-05-01 NOTE — Telephone Encounter (Signed)
Pt informed that referral has been entered.

## 2016-05-01 NOTE — Telephone Encounter (Signed)
Please adivse - Pt rq for ENT referral.  See initial phone note for details.  Recent OV diagnoses were for lip lesion and bilateral tinnitus.

## 2016-05-15 ENCOUNTER — Encounter: Payer: Self-pay | Admitting: Internal Medicine

## 2016-05-15 ENCOUNTER — Ambulatory Visit (INDEPENDENT_AMBULATORY_CARE_PROVIDER_SITE_OTHER): Payer: Managed Care, Other (non HMO) | Admitting: Internal Medicine

## 2016-05-15 VITALS — BP 144/84 | HR 60 | Temp 98.7°F | Resp 16 | Ht 74.0 in | Wt 249.0 lb

## 2016-05-15 DIAGNOSIS — G500 Trigeminal neuralgia: Secondary | ICD-10-CM

## 2016-05-15 DIAGNOSIS — G501 Atypical facial pain: Secondary | ICD-10-CM

## 2016-05-15 MED ORDER — PREGABALIN 75 MG PO CAPS: 75.0000 mg | ORAL_CAPSULE | Freq: Two times a day (BID) | ORAL | 0 refills | Status: DC

## 2016-05-15 NOTE — Patient Instructions (Signed)
Trigeminal Neuralgia Trigeminal neuralgia is a nerve disorder that causes attacks of severe facial pain. The attacks last from a few seconds to several minutes. They can happen for days, weeks, or months and then go away for months or years. Trigeminal neuralgia is also called tic douloureux. What are the causes? This condition is caused by damage to a nerve in the face that is called the trigeminal nerve. An attack can be triggered by:  Talking.  Chewing.  Putting on makeup.  Washing your face.  Shaving your face.  Brushing your teeth.  Touching your face. What increases the risk? This condition is more likely to develop in:  Women.  People who are 50 years of age or older. What are the signs or symptoms? The main symptom of this condition is pain in the jaw, lips, eyes, nose, scalp, forehead, and face. The pain may be intense, stabbing, electric, or shock-like. How is this diagnosed? This condition is diagnosed with a physical exam. A CT scan or MRI may be done to rule out other conditions that can cause facial pain. How is this treated? This condition may be treated with:  Avoiding the things that trigger your attacks.  Pain medicine.  Surgery. This may be done in severe cases if other medical treatment does not provide relief. Follow these instructions at home:  Take over-the-counter and prescription medicines only as told by your health care provider.  If you wish to get pregnant, talk with your health care provider before you start trying to get pregnant.  Avoid the things that trigger your attacks. It may help to:  Chew on the unaffected side of your mouth.  Avoid touching your face.  Avoid blasts of hot or cold air. Contact a health care provider if:  Your pain medicine is not helping.  You develop new, unexplained symptoms, such as:  Double vision.  Facial weakness.  Changes in hearing or balance.  You become pregnant. Get help right away  if:  Your pain is unbearable, and your pain medicine does not help. This information is not intended to replace advice given to you by your health care provider. Make sure you discuss any questions you have with your health care provider. Document Released: 06/07/2000 Document Revised: 02/11/2016 Document Reviewed: 10/03/2014 Elsevier Interactive Patient Education  2017 [employer]  

## 2016-05-15 NOTE — Progress Notes (Signed)
Subjective:  Patient ID: Willie Black, male    DOB: 03-23-1951  Age: 65 y.o. MRN: [id number]  CC: Facial Pain   HPI WINDEL TENOLD presents for a one-month history of right facial pain that he describes as an intermittent stabbing sensation of his right forehead and right malar surface. The symptoms start in the morning when he starts washing his face/hair and brushing his hair. He feels like a lightening bolt sensation shoots across the right side of his face. He has seen an ENT doctor and a dentist and neither one found any pathology to explain his symptoms. He denies any recent episodes of rash, headache, nausea, vomiting, visual disturbance, trouble swallowing, or lymphadenopathy.  Outpatient Medications Prior to Visit  Medication Sig Dispense Refill  . aspirin 81 MG tablet Take 81 mg by mouth daily.      . Calcium Carbonate-Vitamin D (CALCIUM + D PO) Take by mouth daily.      . metoprolol tartrate (LOPRESSOR) 25 MG tablet Take 1 tablet (25 mg total) by mouth 3 (three) times daily. 90 tablet 3  . omeprazole-sodium bicarbonate (ZEGERID) 40-1100 MG per capsule Take 1 capsule by mouth daily.      Marland Kitchen ezetimibe (ZETIA) 10 MG tablet Take 1 tablet (10 mg total) by mouth daily. (Patient not taking: Reported on 05/15/2016) 30 tablet 6  . rosuvastatin (CRESTOR) 10 MG tablet Take 5 mg by mouth every other day.     No facility-administered medications prior to visit.     ROS Review of Systems  Constitutional: Negative for activity change, chills, diaphoresis, fatigue and unexpected weight change.  HENT: Negative.  Negative for congestion, dental problem, drooling, facial swelling, hearing loss, postnasal drip, rhinorrhea, sinus pain, sinus pressure, sore throat, trouble swallowing and voice change.   Eyes: Negative.  Negative for visual disturbance.  Respiratory: Negative for cough, choking, chest tightness, shortness of breath and stridor.   Cardiovascular: Negative for chest pain,  palpitations and leg swelling.  Gastrointestinal: Negative.  Negative for abdominal pain, constipation, diarrhea, nausea and vomiting.  Endocrine: Negative.   Genitourinary: Negative.   Musculoskeletal: Negative for back pain and neck pain.  Allergic/Immunologic: Negative.   Neurological: Negative.  Negative for dizziness, tremors, facial asymmetry, weakness, light-headedness, numbness and headaches.  Hematological: Negative.  Negative for adenopathy. Does not bruise/bleed easily.  Psychiatric/Behavioral: Negative.     Objective:  BP (!) 144/84 (BP Location: Left Arm, Patient Position: Sitting, Cuff Size: Normal)   Pulse 60   Temp 98.7 F (37.1 C) (Oral)   Resp 16   Ht 6\' 2"  (1.88 m)   Wt 249 lb (112.9 kg)   SpO2 96%   BMI 31.97 kg/m   BP Readings from Last 3 Encounters:  05/15/16 (!) 144/84  02/08/16 138/86  08/21/15 118/78    Wt Readings from Last 3 Encounters:  05/15/16 249 lb (112.9 kg)  02/08/16 245 lb 12 oz (111.5 kg)  09/06/15 247 lb (112 kg)    Physical Exam  Constitutional: He is oriented to person, place, and time. No distress.  HENT:  Mouth/Throat: Oropharynx is clear and moist. No oropharyngeal exudate.  Eyes: Conjunctivae are normal. Right eye exhibits no discharge. Left eye exhibits no discharge. No scleral icterus.  Neck: Normal range of motion. Neck supple. No JVD present. No tracheal deviation present. No thyromegaly present.  Cardiovascular: Normal rate, regular rhythm, normal heart sounds and intact distal pulses.  Exam reveals no gallop and no friction rub.   No murmur heard.  Pulmonary/Chest: Effort normal and breath sounds normal. No respiratory distress. He has no wheezes. He has no rales. He exhibits no tenderness.  Abdominal: Soft. He exhibits no distension and no mass. There is no tenderness. There is no rebound and no guarding.  Musculoskeletal: Normal range of motion. He exhibits no edema, tenderness or deformity.  Lymphadenopathy:    He has  no cervical adenopathy.  Neurological: He is alert and oriented to person, place, and time. He has normal strength and normal reflexes. He displays no atrophy, no tremor and normal reflexes. No cranial nerve deficit or sensory deficit. He exhibits normal muscle tone. He displays no seizure activity. Coordination and gait normal.  Skin: Skin is warm and dry. No rash noted. He is not diaphoretic. No erythema. No pallor.  Psychiatric: He has a normal mood and affect. His behavior is normal. Judgment and thought content normal.  Vitals reviewed.   Lab Results  Component Value Date   WBC 4.6 09/30/2013   HGB 14.0 09/30/2013   HCT 41.1 09/30/2013   PLT 175.0 09/30/2013   GLUCOSE 91 09/06/2015   CHOL 179 09/06/2015   TRIG 58 09/06/2015   HDL 54 09/06/2015   LDLCALC 113 09/06/2015   ALT 12 09/06/2015   AST 16 09/06/2015   NA 141 09/06/2015   K 4.6 09/06/2015   CL 101 09/06/2015   CREATININE 1.06 09/06/2015   BUN 15 09/06/2015   CO2 29 09/06/2015   TSH 1.04 09/30/2013   PSA 0.45 09/30/2013   INR 0.97 11/22/2010    No results found.  Assessment & Plan:   Ferd was seen today for facial pain.  Diagnoses and all orders for this visit:  Atypical facial pain -     pregabalin (LYRICA) 75 MG capsule; Take 1 capsule (75 mg total) by mouth 2 (two) times daily.  Trigeminal neuralgia of right side of face -     pregabalin (LYRICA) 75 MG capsule; Take 1 capsule (75 mg total) by mouth 2 (two) times daily.   I am having Mr. Delone start on pregabalin. I am also having him maintain his omeprazole-sodium bicarbonate, aspirin, Calcium Carbonate-Vitamin D (CALCIUM + D PO), rosuvastatin, ezetimibe, and metoprolol tartrate.  Meds ordered this encounter  Medications  . pregabalin (LYRICA) 75 MG capsule    Sig: Take 1 capsule (75 mg total) by mouth 2 (two) times daily.    Dispense:  42 capsule    Refill:  0     Follow-up: Return in about 3 weeks (around 06/05/2016).  Scarlette Calico,  MD

## 2016-05-15 NOTE — Progress Notes (Signed)
Pre visit review using our clinic review tool, if applicable. No additional management support is needed unless otherwise documented below in the visit note. 

## 2016-05-29 ENCOUNTER — Telehealth: Payer: Self-pay | Admitting: Emergency Medicine

## 2016-05-29 NOTE — Telephone Encounter (Signed)
See below

## 2016-05-29 NOTE — Telephone Encounter (Signed)
Pt called to inform you that he started taking pregabalin (LYRICA) 75 MG capsule. He said for th first week it worked well but the 2nd week its not working as well, seems not to be working all 12 hrs. Please advise thanks.

## 2016-05-29 NOTE — Telephone Encounter (Signed)
Please advise at your convenience 

## 2016-05-29 NOTE — Telephone Encounter (Signed)
Double the dose

## 2016-05-30 NOTE — Telephone Encounter (Signed)
Pt informed of PCP recommendation to double dose. Pt will call back on Monday with results.   FYI he may need an rx called in. He will run out of samples given at [facility] on Monday.

## 2016-06-03 ENCOUNTER — Other Ambulatory Visit: Payer: Self-pay | Admitting: Internal Medicine

## 2016-06-03 DIAGNOSIS — G500 Trigeminal neuralgia: Secondary | ICD-10-CM

## 2016-06-03 DIAGNOSIS — G501 Atypical facial pain: Secondary | ICD-10-CM

## 2016-06-03 MED ORDER — PREGABALIN 75 MG PO CAPS: 75.0000 mg | ORAL_CAPSULE | Freq: Three times a day (TID) | ORAL | 5 refills | Status: DC

## 2016-06-03 NOTE — Telephone Encounter (Signed)
Pt left msg on triage stating he has been taking the Lyrica three times a day and it is working for him. He is needing a prescription called into his pharmacy...Willie Black

## 2016-06-03 NOTE — Telephone Encounter (Signed)
Notified pt MD ok rx for three times a day. Verified pharmacy inform will send to [facility].../lmb

## 2016-06-03 NOTE — Telephone Encounter (Signed)
Rx written.

## 2016-07-25 ENCOUNTER — Telehealth: Payer: Self-pay | Admitting: Emergency Medicine

## 2016-07-25 ENCOUNTER — Other Ambulatory Visit: Payer: Self-pay | Admitting: Internal Medicine

## 2016-07-25 DIAGNOSIS — G500 Trigeminal neuralgia: Secondary | ICD-10-CM

## 2016-07-25 MED ORDER — AMITRIPTYLINE HCL 10 MG PO TABS: 10.0000 mg | ORAL_TABLET | Freq: Every day | ORAL | 1 refills | Status: DC

## 2016-07-25 NOTE — Telephone Encounter (Signed)
Pt stated the lyrica is not working. Is there an alternative?

## 2016-07-25 NOTE — Telephone Encounter (Signed)
Pt called and stated the medication he is on isnt working. He is wondering if there is something else he can do. Please advise thanks.

## 2016-07-25 NOTE — Telephone Encounter (Signed)
Pt informed of both

## 2016-07-25 NOTE — Telephone Encounter (Signed)
RX sent to pharmacy and referral to neurology

## 2016-07-31 ENCOUNTER — Encounter: Payer: Self-pay | Admitting: Neurology

## 2016-07-31 ENCOUNTER — Ambulatory Visit (INDEPENDENT_AMBULATORY_CARE_PROVIDER_SITE_OTHER): Payer: Managed Care, Other (non HMO) | Admitting: Neurology

## 2016-07-31 DIAGNOSIS — Z8619 Personal history of other infectious and parasitic diseases: Secondary | ICD-10-CM

## 2016-07-31 DIAGNOSIS — B029 Zoster without complications: Secondary | ICD-10-CM | POA: Insufficient documentation

## 2016-07-31 MED ORDER — VALACYCLOVIR HCL 1 G PO TABS: 1000.0000 mg | ORAL_TABLET | Freq: Three times a day (TID) | ORAL | 0 refills | Status: DC

## 2016-07-31 MED ORDER — OXCARBAZEPINE 150 MG PO TABS: 150.0000 mg | ORAL_TABLET | Freq: Two times a day (BID) | ORAL | 6 refills | Status: DC

## 2016-07-31 NOTE — Progress Notes (Signed)
PATIENT: Willie Black DOB: 03-21-1951  Chief Complaint  Patient presents with  . Trigeminal Neuralgia    He is here with his wife, [name].  He has been having right-sided facial pain for 2.5 months.  He is getting limited relief with Lyrica 75mg , 3-4 times daily.  He has a prescription of amitriptyline 10mg  but has not started it yet.  He was concerned about the medication due to his past history on heart attack in 2011.  Marland Kitchen PCP    Janith Lima, MD     HISTORICAL  Willie Black is a 66 years old right-handed male, accompanied by his wife, seen in refer by his primary care physician Dr. Scarlette Calico for evaluation of medication management for his right facial pain, initial evaluation was on July 31 2016.    He had a past medical history of hypertension, coronary artery disease, status post bypass in 2011, history of thoracic shingles, right V3 shingles in 2016,  Starting in December 2017, he noticed radiating pain from right ear to right temporal region, sharp transient, triggered by eating, biting down, wind blow on his face, he was evaluated by dentist, ENT, no etiology was found, he was diagnosed with trigeminal neuralgia was getting a titrating dose of Lyrica, currently taking 75 mg 3 times a day, Lyrica initially was helpful, but now less effective, he also noticed 15 pounds of weight gain over the past 2 months, lower extremity bilateral hands edema, now his right facial pain has changed to involving right frontal periorbital region, radiating pain to right skull, allodynia, he described difficulty washing his hair, there was no rash broke out, no vesicles noticed. He denies right visual loss  REVIEW OF SYSTEMS: Full 14 system review of systems performed and notable only for  Weight gain, swelling legs, ringing ears, snoring, easy bruising, easy bleeding, joint pain, cramps, achy muscles, headache, snoring, restless leg, depression, not enough sleep, racing  thoughts.  ALLERGIES: Allergies  Allergen Reactions  . Penicillins Rash    Ulcers in the mouth    HOME MEDICATIONS: Current Outpatient Prescriptions  Medication Sig Dispense Refill  . amitriptyline (ELAVIL) 10 MG tablet Take 1 tablet (10 mg total) by mouth at bedtime. 90 tablet 1  . aspirin 81 MG tablet Take 81 mg by mouth daily.      . Calcium Carbonate-Vitamin D (CALCIUM + D PO) Take by mouth daily.      . metoprolol tartrate (LOPRESSOR) 25 MG tablet Take 1 tablet (25 mg total) by mouth 3 (three) times daily. 90 tablet 3  . omeprazole-sodium bicarbonate (ZEGERID) 40-1100 MG per capsule Take 1 capsule by mouth daily.      . pregabalin (LYRICA) 75 MG capsule Take 1 capsule (75 mg total) by mouth 3 (three) times daily. 90 capsule 5   No current facility-administered medications for this visit.     PAST MEDICAL HISTORY: Past Medical History:  Diagnosis Date  . Anxiety   . CAD (coronary artery disease)    S/P prior CABG  x 3 in December 2011  . GERD (gastroesophageal reflux disease)   . Heart attack 2011  . Hypercholesteremia   . PVC (premature ventricular contraction)    Bigeminy PVC's on event monitor Nov 2012  . Renal calculi   . Shingles   . Trigeminal neuralgia of right side of face     PAST SURGICAL HISTORY: Past Surgical History:  Procedure Laterality Date  . CERVICAL DISC SURGERY    .  CORONARY ARTERY BYPASS GRAFT  05/28/2110   x3 with LIMA to LAD, SVG to OM and SVG to LAD after having ST elevation with coming off pump    FAMILY HISTORY: Family History  Problem Relation Age of Onset  . Aneurysm Father     abdominal  . Coronary artery disease Father     3 stents  . Heart failure Mother     SOCIAL HISTORY:  Social History   Social History  . Marital status: Married    Spouse name: N/A  . Number of children: 2  . Years of education: 12   Occupational History  . [employer]   Social History Main Topics  . Smoking status: Former  Smoker    Types: Cigarettes    Quit date: 04/27/2009  . Smokeless tobacco: Never Used  . Alcohol use No  . Drug use: No  . Sexual activity: Yes   Other Topics Concern  . Not on file   Social History Narrative   Lives at home with his wife.   Right-handed.   2 cups caffeine daily.     PHYSICAL EXAM   Vitals:   07/31/16 0900  BP: 122/75  Pulse: (!) 55  Weight: 252 lb (114.3 kg)  Height: 6\' 2"  (1.88 m)    Not recorded      Body mass index is 32.35 kg/m.  PHYSICAL EXAMNIATION:  Gen: NAD, conversant, well nourised, obese, well groomed                     Cardiovascular: Regular rate rhythm, no peripheral edema, warm, nontender. Eyes: Conjunctivae clear without exudates or hemorrhage Neck: Supple, no carotid bruits. Pulmonary: Clear to auscultation bilaterally   NEUROLOGICAL EXAM:  MENTAL STATUS: Speech:    Speech is normal; fluent and spontaneous with normal comprehension.  Cognition:     Orientation to time, place and person     Normal recent and remote memory     Normal Attention span and concentration     Normal Language, naming, repeating,spontaneous speech     Fund of knowledge   CRANIAL NERVES: CN II: Visual fields are full to confrontation. Fundoscopic exam is normal with sharp discs and no vascular changes. Pupils are round equal and briskly reactive to light. CN III, IV, VI: extraocular movement are normal. No ptosis. CN V: Facial sensation is intact to pinprick in all 3 divisions bilaterally. Corneal responses are intact.  CN VII: Face is symmetric with normal eye closure and smile. CN VIII: Hearing is normal to rubbing fingers CN IX, X: Palate elevates symmetrically. Phonation is normal. CN XI: Head turning and shoulder shrug are intact CN XII: Tongue is midline with normal movements and no atrophy.  MOTOR: There is no pronator drift of out-stretched arms. Muscle bulk and tone are normal. Muscle strength is normal.  REFLEXES: Reflexes are 2+  and symmetric at the biceps, triceps, knees, and ankles. Plantar responses are flexor.  SENSORY: Intact to light touch, pinprick, positional sensation and vibratory sensation are intact in fingers and toes.  COORDINATION: Rapid alternating movements and fine finger movements are intact. There is no dysmetria on finger-to-nose and heel-knee-shin.    GAIT/STANCE: Posture is normal. Gait is steady with normal steps, base, arm swing, and turning. Heel and toe walking are normal. Tandem gait is normal.  Romberg is absent.   DIAGNOSTIC DATA (LABS, IMAGING, TESTING) - I reviewed patient records, labs, notes, testing and imaging myself where available.  ASSESSMENT AND PLAN  Willie Black is a 66 y.o. male   New-onset right facial neuropathic pain  Most suggestive of right trigeminal neuralgia  The atypical pattern is change of neuropathic pain initially was involving right V2 territory, now involving right V1 territory, with significant skin allodynia,  He had a history of shingles, including right V3, right lower lip shingles in 2016, differentiation diagnosis including subclinical shingles,  Proceed with MRI of the brain with and without contrast to rule out trigeminal ganglion structural lesion,  Empirically treat him with valtrex 1000mg  tid.  Keep Lyrica 75 mg 3 times a day, add on Trileptal 150 mg twice a day   Marcial Pacas, M.D. Ph.D.  [facility] [address] [place],  [address] Ph: [phone] Fax: [phone]  CC: Janith Lima, MD

## 2016-08-01 ENCOUNTER — Other Ambulatory Visit: Payer: Self-pay

## 2016-08-01 LAB — BUN+CREAT
BUN/Creatinine Ratio: 10 (ref 10–24)
BUN: 11 mg/dL (ref 8–27)
Creatinine, Ser: 1.1 mg/dL (ref 0.76–1.27)
GFR calc Af Amer: 82 mL/min/{1.73_m2} (ref >59)
GFR calc non Af Amer: 71 mL/min/{1.73_m2} (ref >59)

## 2016-08-01 MED ORDER — METOPROLOL TARTRATE 25 MG PO TABS: 25.0000 mg | ORAL_TABLET | Freq: Three times a day (TID) | ORAL | 0 refills | Status: DC

## 2016-08-13 ENCOUNTER — Ambulatory Visit
Admission: RE | Admit: 2016-08-13 | Discharge: 2016-08-13 | Disposition: A | Discharge status: Home / Self Care | Payer: Managed Care, Other (non HMO) | Source: Ambulatory Visit | Attending: Neurology | Admitting: Neurology

## 2016-08-13 DIAGNOSIS — Z8619 Personal history of other infectious and parasitic diseases: Secondary | ICD-10-CM | POA: Diagnosis not present

## 2016-08-13 MED ORDER — GADOBENATE DIMEGLUMINE 529 MG/ML IV SOLN
20.0000 mL | Freq: Once | INTRAVENOUS | Status: AC | PRN
  Administered 2016-08-13: 20 mL via INTRAVENOUS

## 2016-08-22 ENCOUNTER — Encounter: Payer: Self-pay | Admitting: Neurology

## 2016-08-22 ENCOUNTER — Ambulatory Visit (INDEPENDENT_AMBULATORY_CARE_PROVIDER_SITE_OTHER): Payer: Managed Care, Other (non HMO) | Admitting: Neurology

## 2016-08-22 VITALS — BP 114/74 | HR 56 | Ht 74.0 in | Wt 254.0 lb

## 2016-08-22 DIAGNOSIS — G500 Trigeminal neuralgia: Secondary | ICD-10-CM | POA: Diagnosis not present

## 2016-08-22 DIAGNOSIS — G501 Atypical facial pain: Secondary | ICD-10-CM | POA: Diagnosis not present

## 2016-08-22 DIAGNOSIS — Z8619 Personal history of other infectious and parasitic diseases: Secondary | ICD-10-CM

## 2016-08-22 MED ORDER — LIDOCAINE 4 % EX CREA: 1.0000 "application " | TOPICAL_CREAM | CUTANEOUS | 6 refills | Status: DC | PRN

## 2016-08-22 NOTE — Progress Notes (Signed)
PATIENT: Willie Black DOB: [birth year]  Chief Complaint  Patient presents with  . Right facial neuropathic pain    He is here with his wife, [name].  States his pain has worsened.  They would like to review his MRI.       HISTORICAL  Willie Black is a 66 years old right-handed male, accompanied by his wife, seen in refer by his primary care physician Dr. Scarlette Calico for evaluation of medication management for his right facial pain, initial evaluation was on July 31 2016.    He had a past medical history of hypertension, coronary artery disease, status post bypass in 2011, history of thoracic shingles, right V3 shingles in 2016,  Starting in December 2017, he noticed radiating pain from right ear to right temporal region, sharp transient, triggered by eating, biting down, wind blow on his face, he was evaluated by dentist, ENT, no etiology was found, he was diagnosed with trigeminal neuralgia was getting a titrating dose of Lyrica, currently taking 75 mg 3 times a day, Lyrica initially was helpful, but now less effective, he also noticed 15 pounds of weight gain over the past 2 months, lower extremity bilateral hands edema, now his right facial pain has changed to involving right frontal periorbital region, radiating pain to right skull, allodynia, he described difficulty washing his hair, there was no rash broke out, no vesicles noticed. He denies right visual loss  UPDATE August 22 2016: He is is with his wife at today's clinical visit, we have personally reviewed MRI of the brain with without contrast on August 14 2016, there was no significant abnormality specific there is no right trigeminal ganglion abnormality noticed.  He complains of new-onset right foot, right ankle pain, is taking Lyrica 75 mg 3 times a day, Trileptal 150 mg twice a day, Elavil 25 mg every day, despite medications he continue complains significant allodynia and right supraorbital region, sometimes  to the point of crying, also deals with depression anxiety.  REVIEW OF SYSTEMS: Full 14 system review of systems performed and notable only for as above. ALLERGIES: Allergies  Allergen Reactions  . Penicillins Rash    Ulcers in the mouth    HOME MEDICATIONS: Current Outpatient Prescriptions  Medication Sig Dispense Refill  . amitriptyline (ELAVIL) 10 MG tablet Take 1 tablet (10 mg total) by mouth at bedtime. 90 tablet 1  . aspirin 81 MG tablet Take 81 mg by mouth daily.      . Calcium Carbonate-Vitamin D (CALCIUM + D PO) Take by mouth daily.      . metoprolol tartrate (LOPRESSOR) 25 MG tablet Take 1 tablet (25 mg total) by mouth 3 (three) times daily. PLEASE MAKE APPOINTMENT FOR FURTHER REFILLS 90 tablet 0  . omeprazole-sodium bicarbonate (ZEGERID) 40-1100 MG per capsule Take 1 capsule by mouth daily.      . OXcarbazepine (TRILEPTAL) 150 MG tablet Take 1 tablet (150 mg total) by mouth 2 (two) times daily. 60 tablet 6  . pregabalin (LYRICA) 75 MG capsule Take 1 capsule (75 mg total) by mouth 3 (three) times daily. 90 capsule 5   No current facility-administered medications for this visit.     PAST MEDICAL HISTORY: Past Medical History:  Diagnosis Date  . Anxiety   . CAD (coronary artery disease)    S/P prior CABG  x 3 in December 2011  . GERD (gastroesophageal reflux disease)   . Heart attack 2011  . Hypercholesteremia   . PVC (premature ventricular  contraction)    Bigeminy PVC's on event monitor Nov 2012  . Renal calculi   . Shingles   . Trigeminal neuralgia of right side of face     PAST SURGICAL HISTORY: Past Surgical History:  Procedure Laterality Date  . CERVICAL DISC SURGERY    . CORONARY ARTERY BYPASS GRAFT  05/28/2110   x3 with LIMA to LAD, SVG to OM and SVG to LAD after having ST elevation with coming off pump    FAMILY HISTORY: Family History  Problem Relation Age of Onset  . Aneurysm Father     abdominal  . Coronary artery disease Father     3 stents  .  Heart failure Mother     SOCIAL HISTORY:  Social History   Social History  . Marital status: Married    Spouse name: N/A  . Number of children: 2  . Years of education: 12   Occupational History  . [employer]   Social History Main Topics  . Smoking status: Former Smoker    Types: Cigarettes    Quit date: 04/27/2009  . Smokeless tobacco: Never Used  . Alcohol use No  . Drug use: No  . Sexual activity: Yes   Other Topics Concern  . Not on file   Social History Narrative   Lives at home with his wife.   Right-handed.   2 cups caffeine daily.     PHYSICAL EXAM   Vitals:   08/22/16 1311  BP: 114/74  Pulse: (!) 56  Weight: 254 lb (115.2 kg)  Height: 6\' 2"  (1.88 m)    Not recorded      Body mass index is 32.61 kg/m.  PHYSICAL EXAMNIATION:  Gen: NAD, conversant, well nourised, obese, well groomed                     Cardiovascular: Regular rate rhythm, no peripheral edema, warm, nontender. Eyes: Conjunctivae clear without exudates or hemorrhage Neck: Supple, no carotid bruits. Pulmonary: Clear to auscultation bilaterally   NEUROLOGICAL EXAM:  MENTAL STATUS: Speech:    Speech is normal; fluent and spontaneous with normal comprehension.  Cognition:     Orientation to time, place and person     Normal recent and remote memory     Normal Attention span and concentration     Normal Language, naming, repeating,spontaneous speech     Fund of knowledge   CRANIAL NERVES: CN II: Visual fields are full to confrontation. Fundoscopic exam is normal with sharp discs and no vascular changes. Pupils are round equal and briskly reactive to light. CN III, IV, VI: extraocular movement are normal. No ptosis. CN V: Facial sensation is intact to pinprick in all 3 divisions bilaterally. Corneal responses are intact, severe radiating pain to right forehead with slight stroke across right supraortibal notch, no significant pain with deep palpitation  CN VII:  Face is symmetric with normal eye closure and smile. CN VIII: Hearing is normal to rubbing fingers CN IX, X: Palate elevates symmetrically. Phonation is normal. CN XI: Head turning and shoulder shrug are intact CN XII: Tongue is midline with normal movements and no atrophy.  MOTOR: There is no pronator drift of out-stretched arms. Muscle bulk and tone are normal. Muscle strength is normal.  REFLEXES: Reflexes are 2+ and symmetric at the biceps, triceps, knees, and ankles. Plantar responses are flexor.  SENSORY: Intact to light touch, pinprick, positional sensation and vibratory sensation are intact in fingers and toes.  COORDINATION:  Rapid alternating movements and fine finger movements are intact. There is no dysmetria on finger-to-nose and heel-knee-shin.    GAIT/STANCE: Antalgic due to right ankle pain,   DIAGNOSTIC DATA (LABS, IMAGING, TESTING) - I reviewed patient records, labs, notes, testing and imaging myself where available.   ASSESSMENT AND PLAN  Willie Black is a 66 y.o. male   New-onset right facial neuropathic pain  Involving right supraorbital nerve distribution  But he failed right supraorbital nerve block  Continue Lyrica 75 mg twice a day, Trileptal 150 mg twice a day  Refer him to the management  Right supraorbital nerve block Used 0.5 cc of 5% Marcaine, plus 3 mg of betamethasone,  The right supraorbital foramen,was palpitated 2-3 cm lateral to the midline of the face, at the inferior edge of the supraorbital ridge.   Injection was performed using 1cc syringe, 27 and 1/2 needle,   Patient tolerate the procedure well, but there was no significant improvement of his right frontal area allodynia following nerve block     Marcial Pacas, M.D. Ph.D.  [facility] [address], [place], [place] [address] Ph: [phone] Fax: [phone]  CC: Janith Lima, MD

## 2016-08-22 NOTE — Progress Notes (Signed)
**  Betamethasone 30mg /68ml, [id number], Lot [id number], Exp 12/2017, office supply.//mck,rn** **Bupivacaine 0.5% 50mg /24ml, NDC [id number], Lot [id number], Exp 11/2017, office supply.//mck,rn**

## 2016-08-22 NOTE — Patient Instructions (Signed)
[facility]: Phillips Mark L MD   5.0 2 [employer] reviews Pain management physician in [place], [place] Address: [address], [place], [place] [address] Hours: Open now   Add full hours Phone: [phone] 

## 2016-08-26 ENCOUNTER — Other Ambulatory Visit: Payer: Self-pay | Admitting: Cardiology

## 2016-09-16 ENCOUNTER — Telehealth: Payer: Self-pay | Admitting: Neurology

## 2016-09-16 NOTE — Telephone Encounter (Signed)
He is aware of to continue his medications until seen by pain management.

## 2016-09-16 NOTE — Telephone Encounter (Signed)
Patient called he want be able  see Dr. Hardin Negus until May . Patient wants to know if he is ok to continue   His medication  Until he gets in with Pain mgt. ?   Continue Lyrica 75 mg twice a day, Trileptal 150 mg twice a day             Refer him to the management  Patient is on CX list as Well.

## 2016-09-19 ENCOUNTER — Ambulatory Visit: Payer: Managed Care, Other (non HMO) | Admitting: Neurology

## 2016-09-24 ENCOUNTER — Other Ambulatory Visit: Payer: Self-pay | Admitting: Cardiology

## 2016-10-01 ENCOUNTER — Other Ambulatory Visit: Payer: Self-pay

## 2016-10-01 MED ORDER — METOPROLOL TARTRATE 25 MG PO TABS: 25.0000 mg | ORAL_TABLET | Freq: Three times a day (TID) | ORAL | 0 refills | Status: DC

## 2016-10-01 NOTE — Telephone Encounter (Signed)
Rx(s) sent to pharmacy electronically.  

## 2016-12-10 ENCOUNTER — Telehealth: Payer: Self-pay | Admitting: Internal Medicine

## 2016-12-17 NOTE — Telephone Encounter (Signed)
Error

## 2017-01-20 DIAGNOSIS — R51 Headache: Secondary | ICD-10-CM | POA: Diagnosis not present

## 2017-01-20 DIAGNOSIS — G894 Chronic pain syndrome: Secondary | ICD-10-CM | POA: Diagnosis not present

## 2017-01-20 DIAGNOSIS — G500 Trigeminal neuralgia: Secondary | ICD-10-CM | POA: Diagnosis not present

## 2017-02-20 ENCOUNTER — Other Ambulatory Visit: Payer: Self-pay | Admitting: Cardiology

## 2017-02-20 MED ORDER — METOPROLOL TARTRATE 25 MG PO TABS: 25.0000 mg | ORAL_TABLET | Freq: Three times a day (TID) | ORAL | 1 refills | Status: DC

## 2017-02-20 NOTE — Telephone Encounter (Signed)
Patient returned call. Spoke with patient and he stated he needed a refill, he made an appt with Bernerd Pho, PA and wanted to know if I could refill his Metoprolol. Rx sent to [facility] patient voiced understanding.

## 2017-02-20 NOTE — Telephone Encounter (Signed)
lmtcb

## 2017-02-20 NOTE — Telephone Encounter (Signed)
Pt has not been seen since 07/2015, Dr. Doug Sou pt. Pt requesting a refill on metoprolol, pt would like a call back at [phone]. Please address. Thanks

## 2017-03-16 NOTE — Progress Notes (Signed)
Cardiology Office Note    Date:  03/17/2017   ID:  Willie Black, DOB 09-09-1950, MRN [id number]  PCP:  Janith Lima, MD  Cardiologist: Dr. Martinique   Chief Complaint  Patient presents with  . Follow-up    Denies any recent symptoms    History of Present Illness:    Willie Black is a 66 y.o. male with past medical history of CAD (s/p CABG in 2011 with LIMA-LAD, SVG-OM, and SVG-PDA), HTN, HLD, and PVC's who presents to the office today for annual follow-up.  He was last examined by Dr. Martinique in 07/2015 and reported doing well from a cardiac perspective at that time, denying any recent symptoms. He was continued on ASA and BB therapy as he had been intolerant to multiple statins in the past.   In talking with the patient today, he reports doing well since his last office visit. He was put on Lyrica earlier this year for treatment of trigeminal neuralgia and reports that this helped his symptoms but he gained upwards of 40 pounds. Lyrica has since been switched to Oxcarbazepine and he reports this has significantly helped with his neuralgia but he feels significantly depressed. He plans to call his Neurologist later today to review his symptoms. He has been trying to actively lose the weight he gained earlier this year and has lost 20 pounds within the past 2 months.  He denies any recent chest pain, dyspnea on exertion, orthopnea, PND, lower extremity edema, or palpitations. He does not check his blood pressure regularly but it is well-controlled at 130/80 during today's visit. He has remained on ASA and Lopressor 25 mg TID, reporting good compliance with his medications.    Past Medical History:  Diagnosis Date  . Anxiety   . CAD (coronary artery disease)    a. s/p CABG in 2011 with LIMA-LAD, SVG-OM, and SVG-PDA b. low-risk NST in 08/2015  . GERD (gastroesophageal reflux disease)   . Heart attack ([place]) 2011  . Hypercholesteremia   . PVC (premature ventricular  contraction)    Bigeminy PVC's on event monitor Nov 2012  . Renal calculi   . Shingles   . Trigeminal neuralgia of right side of face     Past Surgical History:  Procedure Laterality Date  . CERVICAL DISC SURGERY    . CORONARY ARTERY BYPASS GRAFT  05/28/2110   x3 with LIMA to LAD, SVG to OM and SVG to LAD after having ST elevation with coming off pump    Current Medications: Outpatient Medications Prior to Visit  Medication Sig Dispense Refill  . aspirin 81 MG tablet Take 81 mg by mouth daily.      . Calcium Carbonate-Vitamin D (CALCIUM + D PO) Take by mouth daily.      Marland Kitchen lidocaine (LMX) 4 % cream Apply 1 application topically as needed. 15 g 6  . omeprazole-sodium bicarbonate (ZEGERID) 40-1100 MG per capsule Take 1 capsule by mouth daily.      . OXcarbazepine (TRILEPTAL) 150 MG tablet Take 1 tablet (150 mg total) by mouth 2 (two) times daily. 60 tablet 6  . metoprolol tartrate (LOPRESSOR) 25 MG tablet Take 1 tablet (25 mg total) by mouth 3 (three) times daily. NEEDS APPOINTMENT FOR FUTURE REFILLS 270 tablet 1  . amitriptyline (ELAVIL) 10 MG tablet Take 1 tablet (10 mg total) by mouth at bedtime. 90 tablet 1  . pregabalin (LYRICA) 75 MG capsule Take 1 capsule (75 mg total) by mouth 3 (three) times  daily. 90 capsule 5   No facility-administered medications prior to visit.      Allergies:   Penicillins   Social History   Social History  . Marital status: Married    Spouse name: N/A  . Number of children: 2  . Years of education: 12   Occupational History  . [employer]   Social History Main Topics  . Smoking status: Former Smoker    Types: Cigarettes    Quit date: 04/27/2009  . Smokeless tobacco: Never Used  . Alcohol use No  . Drug use: No  . Sexual activity: Yes   Other Topics Concern  . None   Social History Narrative   Lives at home with his wife.   Right-handed.   2 cups caffeine daily.     Family History:  The patient's family history  includes Aneurysm in his father; Coronary artery disease in his father; Heart failure in his mother.   Review of Systems:   Please see the history of present illness.     General:  No chills, fever, night sweats or weight changes. Positive for depression.  Cardiovascular:  No chest pain, dyspnea on exertion, edema, orthopnea, palpitations, paroxysmal nocturnal dyspnea. Dermatological: No rash, lesions/masses Respiratory: No cough, dyspnea Urologic: No hematuria, dysuria Abdominal:   No nausea, vomiting, diarrhea, bright red blood per rectum, melena, or hematemesis Neurologic:  No visual changes, wkns, changes in mental status. All other systems reviewed and are otherwise negative except as noted above.   Physical Exam:    VS:  BP 130/80   Pulse (!) 55   Ht 6\' 2"  (1.88 m)   Wt 246 lb 6.4 oz (111.8 kg)   BMI 31.64 kg/m    General: Well developed, well nourished Caucasian male appearing in no acute distress. Head: Normocephalic, atraumatic, sclera non-icteric, no xanthomas, nares are without discharge.  Neck: No carotid bruits. JVD not elevated.  Lungs: Respirations regular and unlabored, without wheezes or rales.  Heart: Regular rate and rhythm. No S3 or S4.  No murmur, no rubs, or gallops appreciated. Abdomen: Soft, non-tender, non-distended with normoactive bowel sounds. No hepatomegaly. No rebound/guarding. No obvious abdominal masses. Msk:  Strength and tone appear normal for age. No joint deformities or effusions. Extremities: No clubbing or cyanosis. No lower extremity edema.  Distal pedal pulses are 2+ bilaterally. Neuro: Alert and oriented X 3. Moves all extremities spontaneously. No focal deficits noted. Psych:  Responds to questions appropriately with a normal affect. Skin: No rashes or lesions noted  Wt Readings from Last 3 Encounters:  03/17/17 246 lb 6.4 oz (111.8 kg)  08/22/16 254 lb (115.2 kg)  07/31/16 252 lb (114.3 kg)     Studies/Labs Reviewed:   EKG:  EKG  is ordered today.  The ekg ordered today demonstrates sinus bradycardia, HR 55, with incomplete RBBB.   Recent Labs: 07/31/2016: BUN 11; Creatinine, Ser 1.10   Lipid Panel    Component Value Date/Time   CHOL 179 09/06/2015 0846   TRIG 58 09/06/2015 0846   HDL 54 09/06/2015 0846   CHOLHDL 3.3 09/06/2015 0846   VLDL 12 09/06/2015 0846   LDLCALC 113 09/06/2015 0846    Additional studies/ records that were reviewed today include:   NST: 08/2015  The left ventricular ejection fraction is normal (55-65%).  Nuclear stress EF: 63%.  Blood pressure demonstrated a hypertensive response to exercise.  Upsloping ST segment depression ST segment depression was noted during stress in the II, III and  aVF leads.  The study is normal.  This is a low risk study.  Assessment:    1. Coronary artery disease involving native coronary artery of native heart without angina pectoris   2. Hyperlipidemia LDL goal <70   3. Essential hypertension   4. PVC's (premature ventricular contractions)   5. Depression, unspecified depression type      Plan:   In order of problems listed above:  1. CAD - s/p CABG in 2011 with LIMA-LAD, SVG-OM, and SVG-PDA. Recent NST in 08/2015 was low-risk.  - he denies any recent chest pain or dyspnea on exertion.  - continue ASA and Lopressor. Intolerant to multiple statins in the past. Will recheck FLP today.   2. HLD - Lipid Panel in 08/2015 showed total cholesterol of 179, HDL 54, and LDL 113. Not at goal of LDL < 70 with known CAD.  - he has been intolerant to Lipitor, Crestor, Pravastatin, and Zetia in the past. Will recheck FLP today. If still not at goal, will try Crestor 5mg  three times weekly.   3. HTN - BP is well-controlled at 130/80 during today's visit. - continue Lopressor.   4. PVC's - prior event monitor showed bigeminal PVC's. - he denies any recent palpitations.  - continue Lopressor 25mg  TID (reviewed reducing dosing to BID but he wishes to  remain on his current regimen given his well-controlled BP and no recent palpitations which certainly seems reasonable).   5. Depression - reports having little energy, feeling fatigued, and overall depressed since being started on Oxcarbazepine for Trigeminal Neuralgia. Recommended he follow up with his Neurologist who initiated the medication.    Medication Adjustments/Labs and Tests Ordered: Current medicines are reviewed at length with the patient today.  Concerns regarding medicines are outlined above.  Medication changes, Labs and Tests ordered today are listed in the Patient Instructions below. Patient Instructions  Medication Instructions:  Continue current medications  If you need a refill on your cardiac medications before your next appointment, please call your pharmacy.  Labwork: Fasting Lipids HERE IN OUR OFFICE AT [facility]  Testing/Procedures: None Ordered  Follow-Up: Your physician wants you to follow-up in: 1 Year with Dr Martinique. You should receive a reminder letter in the mail two months in advance. If you do not receive a letter, please call our office [phone].   Thank you for choosing [facility] at [employer], [place], [place]  03/17/2017 5:45 PM    [address], [place], [place]  [address] Phone: [phone]; Fax: [phone]  [address], [place], [place] [address] Phone: [phone]

## 2017-03-17 ENCOUNTER — Ambulatory Visit (INDEPENDENT_AMBULATORY_CARE_PROVIDER_SITE_OTHER): Payer: Commercial Managed Care - HMO | Admitting: Student

## 2017-03-17 ENCOUNTER — Encounter: Payer: Self-pay | Admitting: Student

## 2017-03-17 VITALS — BP 130/80 | HR 55 | Ht 74.0 in | Wt 246.4 lb

## 2017-03-17 DIAGNOSIS — I2510 Atherosclerotic heart disease of native coronary artery without angina pectoris: Secondary | ICD-10-CM | POA: Diagnosis not present

## 2017-03-17 DIAGNOSIS — E785 Hyperlipidemia, unspecified: Secondary | ICD-10-CM

## 2017-03-17 DIAGNOSIS — F329 Major depressive disorder, single episode, unspecified: Secondary | ICD-10-CM | POA: Diagnosis not present

## 2017-03-17 DIAGNOSIS — F32A Depression, unspecified: Secondary | ICD-10-CM

## 2017-03-17 DIAGNOSIS — I493 Ventricular premature depolarization: Secondary | ICD-10-CM

## 2017-03-17 DIAGNOSIS — I10 Essential (primary) hypertension: Secondary | ICD-10-CM | POA: Diagnosis not present

## 2017-03-17 MED ORDER — METOPROLOL TARTRATE 25 MG PO TABS: 25.0000 mg | ORAL_TABLET | Freq: Three times a day (TID) | ORAL | 3 refills | Status: DC

## 2017-03-17 NOTE — Patient Instructions (Signed)
Medication Instructions:  Continue current medications  If you need a refill on your cardiac medications before your next appointment, please call your pharmacy.  Labwork: Fasting Lipids HERE IN OUR OFFICE AT [facility]  Testing/Procedures: None Ordered  Follow-Up: Your physician wants you to follow-up in: 1 Year with Dr Martinique. You should receive a reminder letter in the mail two months in advance. If you do not receive a letter, please call our office [phone].   Thank you for choosing [facility] at [facility]!!

## 2017-04-21 DIAGNOSIS — M47816 Spondylosis without myelopathy or radiculopathy, lumbar region: Secondary | ICD-10-CM | POA: Diagnosis not present

## 2017-04-21 DIAGNOSIS — G894 Chronic pain syndrome: Secondary | ICD-10-CM | POA: Diagnosis not present

## 2017-04-21 DIAGNOSIS — G500 Trigeminal neuralgia: Secondary | ICD-10-CM | POA: Diagnosis not present

## 2017-04-21 DIAGNOSIS — R51 Headache: Secondary | ICD-10-CM | POA: Diagnosis not present

## 2017-06-09 DIAGNOSIS — G500 Trigeminal neuralgia: Secondary | ICD-10-CM | POA: Diagnosis not present

## 2017-06-09 DIAGNOSIS — G894 Chronic pain syndrome: Secondary | ICD-10-CM | POA: Diagnosis not present

## 2017-06-09 DIAGNOSIS — M47816 Spondylosis without myelopathy or radiculopathy, lumbar region: Secondary | ICD-10-CM | POA: Diagnosis not present

## 2017-06-09 DIAGNOSIS — R51 Headache: Secondary | ICD-10-CM | POA: Diagnosis not present

## 2017-06-25 DIAGNOSIS — I82612 Acute embolism and thrombosis of superficial veins of left upper extremity: Secondary | ICD-10-CM | POA: Diagnosis not present

## 2017-06-25 DIAGNOSIS — I809 Phlebitis and thrombophlebitis of unspecified site: Secondary | ICD-10-CM | POA: Diagnosis not present

## 2017-06-25 DIAGNOSIS — M25532 Pain in left wrist: Secondary | ICD-10-CM | POA: Diagnosis not present

## 2017-06-25 DIAGNOSIS — M25522 Pain in left elbow: Secondary | ICD-10-CM | POA: Diagnosis not present

## 2017-06-25 DIAGNOSIS — M79602 Pain in left arm: Secondary | ICD-10-CM | POA: Diagnosis not present

## 2017-07-28 ENCOUNTER — Ambulatory Visit (INDEPENDENT_AMBULATORY_CARE_PROVIDER_SITE_OTHER): Payer: Medicare HMO | Admitting: Internal Medicine

## 2017-07-28 ENCOUNTER — Encounter: Payer: Self-pay | Admitting: Internal Medicine

## 2017-07-28 ENCOUNTER — Telehealth: Payer: Self-pay | Admitting: Internal Medicine

## 2017-07-28 ENCOUNTER — Other Ambulatory Visit (INDEPENDENT_AMBULATORY_CARE_PROVIDER_SITE_OTHER): Payer: Self-pay

## 2017-07-28 ENCOUNTER — Ambulatory Visit (HOSPITAL_COMMUNITY)
Admission: RE | Admit: 2017-07-28 | Discharge: 2017-07-28 | Disposition: A | Discharge status: Home / Self Care | Payer: PRIVATE HEALTH INSURANCE | Source: Ambulatory Visit | Attending: Surgery | Admitting: Surgery

## 2017-07-28 VITALS — BP 110/64 | HR 66 | Temp 98.1°F | Resp 16 | Ht 74.0 in | Wt 250.0 lb

## 2017-07-28 DIAGNOSIS — M7989 Other specified soft tissue disorders: Secondary | ICD-10-CM

## 2017-07-28 DIAGNOSIS — I10 Essential (primary) hypertension: Secondary | ICD-10-CM | POA: Diagnosis not present

## 2017-07-28 DIAGNOSIS — R7989 Other specified abnormal findings of blood chemistry: Secondary | ICD-10-CM

## 2017-07-28 DIAGNOSIS — M79662 Pain in left lower leg: Secondary | ICD-10-CM | POA: Insufficient documentation

## 2017-07-28 DIAGNOSIS — I82442 Acute embolism and thrombosis of left tibial vein: Secondary | ICD-10-CM | POA: Diagnosis not present

## 2017-07-28 DIAGNOSIS — I824Z9 Acute embolism and thrombosis of unspecified deep veins of unspecified distal lower extremity: Secondary | ICD-10-CM | POA: Insufficient documentation

## 2017-07-28 LAB — BASIC METABOLIC PANEL
BUN: 24 mg/dL — ABNORMAL HIGH (ref 6–23)
CO2: 30 mEq/L (ref 19–32)
Calcium: 9.1 mg/dL (ref 8.4–10.5)
Chloride: 101 mEq/L (ref 96–112)
Creatinine, Ser: 1.14 mg/dL (ref 0.40–1.50)
GFR: 68.41 mL/min (ref >60.00)
Glucose, Bld: 95 mg/dL (ref 70–99)
Potassium: 4.1 mEq/L (ref 3.5–5.1)
Sodium: 137 mEq/L (ref 135–145)

## 2017-07-28 LAB — D-DIMER, QUANTITATIVE (NOT AT ARMC): D-Dimer, Quant: 4.28 mcg/mL FEU — ABNORMAL HIGH (ref <0.50)

## 2017-07-28 MED ORDER — RIVAROXABAN (XARELTO) VTE STARTER PACK (15 & 20 MG): ORAL_TABLET | ORAL | 0 refills | Status: DC

## 2017-07-28 NOTE — Progress Notes (Signed)
Subjective:  Patient ID: Willie Black, male    DOB: [birth year]  Age: 67 y.o. MRN: [id number]  CC: Hypertension   HPI Willie Black presents for f/up - He complains of a 4-day history of pain and swelling in his left lower leg and foot.  About a week ago he tells me he was seen at an urgent care center for swelling over his left forearm and it sounds like he was diagnosed with superficial thrombophlebitis and was treated with an anti-inflammatory.  The symptoms he has today are new.  He has had some recent travel but reports no other risk factors for thrombosis.  He denies a family history of thrombosis.  Outpatient Medications Prior to Visit  Medication Sig Dispense Refill  . aspirin 81 MG tablet Take 81 mg by mouth daily.      . Calcium Carbonate-Vitamin D (CALCIUM + D PO) Take by mouth daily.      Marland Kitchen escitalopram (LEXAPRO) 20 MG tablet Take 20 mg by mouth daily.  1  . metoprolol tartrate (LOPRESSOR) 25 MG tablet Take 1 tablet (25 mg total) by mouth 3 (three) times daily. 270 tablet 3  . naproxen (NAPROSYN) 500 MG tablet     . omeprazole-sodium bicarbonate (ZEGERID) 40-1100 MG per capsule Take 1 capsule by mouth daily.      . OXcarbazepine (TRILEPTAL) 150 MG tablet Take 1 tablet (150 mg total) by mouth 2 (two) times daily. 60 tablet 6  . lidocaine (LMX) 4 % cream Apply 1 application topically as needed. 15 g 6   No facility-administered medications prior to visit.     ROS Review of Systems  Constitutional: Negative for diaphoresis and fatigue.  HENT: Negative.   Eyes: Negative.  Negative for visual disturbance.  Respiratory: Negative for cough, chest tightness, shortness of breath and wheezing.   Cardiovascular: Positive for leg swelling. Negative for chest pain and palpitations.  Gastrointestinal: Negative for abdominal pain, diarrhea, nausea and vomiting.  Endocrine: Negative.   Genitourinary: Negative.  Negative for difficulty urinating and hematuria.    Musculoskeletal: Negative.  Negative for back pain and myalgias.  Skin: Negative.  Negative for color change.  Allergic/Immunologic: Negative.   Neurological: Negative.  Negative for dizziness.  Hematological: Negative for adenopathy. Does not bruise/bleed easily.  Psychiatric/Behavioral: Negative.     Objective:  BP 110/64 (BP Location: Left Arm, Patient Position: Sitting, Cuff Size: Large)   Pulse 66   Temp 98.1 F (36.7 C) (Oral)   Resp 16   Ht 6\' 2"  (1.88 m)   Wt 250 lb (113.4 kg)   SpO2 99%   BMI 32.10 kg/m   BP Readings from Last 3 Encounters:  07/28/17 110/64  03/17/17 130/80  08/22/16 114/74    Wt Readings from Last 3 Encounters:  07/28/17 250 lb (113.4 kg)  03/17/17 246 lb 6.4 oz (111.8 kg)  08/22/16 254 lb (115.2 kg)    Physical Exam  Constitutional: He is oriented to person, place, and time. No distress.  HENT:  Mouth/Throat: Oropharynx is clear and moist. No oropharyngeal exudate.  Eyes: Conjunctivae are normal. Left eye exhibits no discharge. No scleral icterus.  Neck: Normal range of motion. Neck supple. No JVD present. No thyromegaly present.  Cardiovascular: Normal rate, regular rhythm and normal heart sounds. Exam reveals no gallop.  No murmur heard. Pulmonary/Chest: Effort normal and breath sounds normal. No respiratory distress. He has no wheezes. He has no rales.  Abdominal: Soft. Bowel sounds are normal. He exhibits no  distension and no mass. There is no tenderness.  Musculoskeletal: Normal range of motion. He exhibits edema and tenderness.  Left lower extremity, over the calf and foot shows 1+ pitting edema compared to no edema in the right lower extremity.  The left calf is also tender.  Left calf is larger than the right calf.  Examination of the left forearm is normal today.  There is no evidence of thrombophlebitis at this time.  Lymphadenopathy:    He has no cervical adenopathy.  Neurological: He is alert and oriented to person, place, and  time.  Skin: Skin is warm and dry. No rash noted. He is not diaphoretic. No erythema. No pallor.  Vitals reviewed.   Lab Results  Component Value Date   WBC 4.6 09/30/2013   HGB 14.0 09/30/2013   HCT 41.1 09/30/2013   PLT 175.0 09/30/2013   GLUCOSE 95 07/28/2017   CHOL 179 09/06/2015   TRIG 58 09/06/2015   HDL 54 09/06/2015   LDLCALC 113 09/06/2015   ALT 12 09/06/2015   AST 16 09/06/2015   NA 137 07/28/2017   K 4.1 07/28/2017   CL 101 07/28/2017   CREATININE 1.14 07/28/2017   BUN 24 (H) 07/28/2017   CO2 30 07/28/2017   TSH 1.04 09/30/2013   PSA 0.45 09/30/2013   INR 0.97 11/22/2010    Mr Brain W Wo Contrast  Result Date: 08/14/2016  [facility] [address], [place], [place] [address] [phone] NEUROIMAGING REPORT STUDY DATE: 08/13/2016 PATIENT NAME: FARD BORUNDA DOB: Feb 29, 1951 MRN: [id number] EXAM: MRI Brain without contrast ORDERING CLINICIAN: Marcial Pacas M.D. PhD CLINICAL HISTORY: 67 year old man with right facial pain and history of shingles COMPARISON FILMS: None TECHNIQUE: MRI of the brain without contrast was obtained utilizing 5 mm axial slices with T1, T2, T2 flair, SWI and diffusion weighted views.  T1 sagittal and T2 coronal views were obtained. CONTRAST: none IMAGING SITE: [facility], [place], [place] FINDINGS: On sagittal images, the spinal cord is imaged caudally to C2-C3 and is normal in caliber.   The contents of the posterior fossa are of normal size and position.   The pituitary tissue is thinned within a normal sized sella turcica consistent with a "partially empty sella"  The optic chiasm appear normal.    The ventricles are normal in size for ageand without distortion.  There is parietal atrophy. Additionally there is a fluid collection on the left convexity that could be consistent with a hygroma. There is no mass effect. The cerebellum and brainstem appears normal.   The deep gray matter appears normal.  The  cerebral hemispheres have normal signal for age.  Diffusion weighted images are normal.  Susceptibility weighted images are normal.  The orbits appear normal.   The VIIth/VIIIth nerve complex appears normal.  The trigeminal nerves appear normal.  The right superior cerebellar artery is adjacent to the proximal Vth nerve but there is no distortion.   The mastoid air cells appear normal.  The paranasal sinuses appear normal.  Flow voids are identified within the major intracerebral arteries.      This MRI of the brain with and without contrast shows the following: 1.   There is bilateral parietal atrophy.  Brain parenchyma has normal signal. 2.   There is a left convexity fluid collection that could represent a chronic hygroma.  There does not appear to be any mass effect. 3.   There is a "partially empty sella turcica". This is  likely to be incidental as the sella turcica is normal in size. 4.   There is no definite compression of the right fifth nerve or altered signal before or after contrast administration. The right superior cerebellar artery is adjacent to the proximal nerve but the nerve is not distorted. INTERPRETING PHYSICIAN: Richard A. Felecia Shelling, MD, PhD Certified in  Neuroimaging by [place] [employer] [place]:   Jaremy was seen today for hypertension.  Diagnoses and all orders for this visit:  Pain and swelling of left lower leg- He has symptoms and exam consistent with DVT.  His d-dimer is elevated above 4.  Will empirically treat for DVT with rivaroxaban.  Ultrasound of left lower extremity is pending.  If he does not have a clot then will discontinue the rivaroxaban and evaluate additional diagnostic and treatment options. -     D-dimer, quantitative (not at [facility]); Future -     VAS [place] LOWER EXTREMITY VENOUS (DVT); Future  Essential hypertension- His blood pressure is well controlled. -     Basic metabolic panel; Future  Elevated d-dimer- As above -      Rivaroxaban 15 & 20 MG TBPK; Take as directed on package: Start with one 15mg  tablet by mouth twice a day with food. On Day 22, switch to one 20mg  tablet once a day with food.   I have discontinued Anette Riedel. Kiel's lidocaine. I am also having him start on Rivaroxaban. Additionally, I am having him maintain his omeprazole-sodium bicarbonate, aspirin, Calcium Carbonate-Vitamin D (CALCIUM + D PO), OXcarbazepine, metoprolol tartrate, escitalopram, and naproxen.  Meds ordered this encounter  Medications  . Rivaroxaban 15 & 20 MG TBPK    Sig: Take as directed on package: Start with one 15mg  tablet by mouth twice a day with food. On Day 22, switch to one 20mg  tablet once a day with food.    Dispense:  51 each    Refill:  0     Follow-up: Return in about 1 day (around 07/29/2017).  Scarlette Calico, MD

## 2017-07-28 NOTE — Telephone Encounter (Signed)
Pearl, from the vascular lab at [facility] states that the pt had a preliminary report that was positive for a blood clot in the posterior tibial veins of the left leg.   Stephanie,CMA at the office contacted and given results. Colletta [place] states that Dr. Ronnald Ramp has already reviewed the results and has called in a prescription for Xarelto to [facility].  [place] notified of that Dr. Ronnald Ramp is already aware of results and will relay the information about the prescription for Xarelto to the pt.

## 2017-07-28 NOTE — Patient Instructions (Signed)

## 2017-07-29 ENCOUNTER — Encounter: Payer: Self-pay | Admitting: Internal Medicine

## 2017-07-30 NOTE — Telephone Encounter (Signed)
Pt is calling and would like to know if he is supposed to stay off his feet at work. Still swollen. Pt would like Dr. Ronnald Ramp nurse to contact him. Follow up appt scheduled 08/20/17.

## 2017-07-30 NOTE — Telephone Encounter (Signed)
Per Dr. Quay Burow  Compress sock Elevate when sitting Not standing as long Could take 3-6 months before it completely resolves.   Contacted patient and informed of Dr. Quay Burow advise and recommendation. Pt stated understanding and will call back if he need any additional restrictions put in writing for employer.

## 2017-08-11 DIAGNOSIS — R51 Headache: Secondary | ICD-10-CM | POA: Diagnosis not present

## 2017-08-11 DIAGNOSIS — G500 Trigeminal neuralgia: Secondary | ICD-10-CM | POA: Diagnosis not present

## 2017-08-11 DIAGNOSIS — M47816 Spondylosis without myelopathy or radiculopathy, lumbar region: Secondary | ICD-10-CM | POA: Diagnosis not present

## 2017-08-11 DIAGNOSIS — G894 Chronic pain syndrome: Secondary | ICD-10-CM | POA: Diagnosis not present

## 2017-08-20 ENCOUNTER — Encounter: Payer: Self-pay | Admitting: Internal Medicine

## 2017-08-20 ENCOUNTER — Ambulatory Visit (INDEPENDENT_AMBULATORY_CARE_PROVIDER_SITE_OTHER): Payer: Medicare HMO | Admitting: Internal Medicine

## 2017-08-20 ENCOUNTER — Other Ambulatory Visit (INDEPENDENT_AMBULATORY_CARE_PROVIDER_SITE_OTHER): Payer: Medicare HMO

## 2017-08-20 VITALS — BP 132/72 | HR 59 | Temp 98.5°F | Resp 16 | Ht 74.0 in | Wt 254.2 lb

## 2017-08-20 DIAGNOSIS — I10 Essential (primary) hypertension: Secondary | ICD-10-CM | POA: Diagnosis not present

## 2017-08-20 DIAGNOSIS — R1084 Generalized abdominal pain: Secondary | ICD-10-CM | POA: Diagnosis not present

## 2017-08-20 DIAGNOSIS — I714 Abdominal aortic aneurysm, without rupture, unspecified: Secondary | ICD-10-CM

## 2017-08-20 DIAGNOSIS — I824Z2 Acute embolism and thrombosis of unspecified deep veins of left distal lower extremity: Secondary | ICD-10-CM

## 2017-08-20 DIAGNOSIS — Z23 Encounter for immunization: Secondary | ICD-10-CM | POA: Diagnosis not present

## 2017-08-20 LAB — CBC WITH DIFFERENTIAL/PLATELET
Basophils Absolute: 0.1 10*3/uL (ref 0.0–0.1)
Basophils Relative: 1.2 % (ref 0.0–3.0)
Eosinophils Absolute: 0.2 10*3/uL (ref 0.0–0.7)
Eosinophils Relative: 3.3 % (ref 0.0–5.0)
HCT: 40.4 % (ref 39.0–52.0)
Hemoglobin: 13.5 g/dL (ref 13.0–17.0)
Lymphocytes Relative: 25.3 % (ref 12.0–46.0)
Lymphs Abs: 1.2 10*3/uL (ref 0.7–4.0)
MCHC: 33.5 g/dL (ref 30.0–36.0)
MCV: 93.7 fl (ref 78.0–100.0)
Monocytes Absolute: 0.7 10*3/uL (ref 0.1–1.0)
Monocytes Relative: 15.2 % — ABNORMAL HIGH (ref 3.0–12.0)
Neutro Abs: 2.7 10*3/uL (ref 1.4–7.7)
Neutrophils Relative %: 55 % (ref 43.0–77.0)
Platelets: 202 10*3/uL (ref 150.0–400.0)
RBC: 4.3 Mil/uL (ref 4.22–5.81)
RDW: 14.1 % (ref 11.5–15.5)
WBC: 4.9 10*3/uL (ref 4.0–10.5)

## 2017-08-20 LAB — URINALYSIS, ROUTINE W REFLEX MICROSCOPIC
Bilirubin Urine: NEGATIVE
Ketones, ur: NEGATIVE
Leukocytes, UA: NEGATIVE
Nitrite: NEGATIVE
Specific Gravity, Urine: 1.02 (ref 1.000–1.030)
Total Protein, Urine: NEGATIVE
Urine Glucose: NEGATIVE
Urobilinogen, UA: 0.2 (ref 0.0–1.0)
WBC, UA: NONE SEEN (ref 0–?)
pH: 7 (ref 5.0–8.0)

## 2017-08-20 LAB — COMPREHENSIVE METABOLIC PANEL
ALT: 21 U/L (ref 0–53)
AST: 19 U/L (ref 0–37)
Albumin: 3.8 g/dL (ref 3.5–5.2)
Alkaline Phosphatase: 88 U/L (ref 39–117)
BUN: 16 mg/dL (ref 6–23)
CO2: 30 mEq/L (ref 19–32)
Calcium: 9.7 mg/dL (ref 8.4–10.5)
Chloride: 103 mEq/L (ref 96–112)
Creatinine, Ser: 1.07 mg/dL (ref 0.40–1.50)
GFR: 73.58 mL/min (ref >60.00)
Glucose, Bld: 85 mg/dL (ref 70–99)
Potassium: 4.5 mEq/L (ref 3.5–5.1)
Sodium: 139 mEq/L (ref 135–145)
Total Bilirubin: 0.4 mg/dL (ref 0.2–1.2)
Total Protein: 7 g/dL (ref 6.0–8.3)

## 2017-08-20 LAB — LIPASE: Lipase: 7 U/L — ABNORMAL LOW (ref 11.0–59.0)

## 2017-08-20 LAB — AMYLASE: Amylase: 16 U/L — ABNORMAL LOW (ref 27–131)

## 2017-08-20 MED ORDER — RIVAROXABAN 20 MG PO TABS: 20.0000 mg | ORAL_TABLET | Freq: Every day | ORAL | 1 refills | Status: DC

## 2017-08-20 NOTE — Patient Instructions (Signed)

## 2017-08-20 NOTE — Progress Notes (Signed)
Subjective:  Patient ID: Willie Black, male    DOB: 1950/11/03  Age: 67 y.o. MRN: [id number]  CC: Abdominal Pain   HPI Willie Black presents for f/up -3 weeks ago he was diagnosed with a spontaneous, non-provoked DVT in his left lower extremity.  He has been compliant with his Xarelto and now tells me that the pain and swelling in his left lower extremity has resolved.  His only risk factor for a DVT is that he has a very sedentary job.  He has new complaints today that include a several day history of diffuse abdominal pain that he describes as a pressure sensation and aching in his lower back.  He has a history of kidney stones but he has not recently noticed any dysuria or hematuria.  He denies loss of appetite, nausea. vomiting, fever or chills.  Outpatient Medications Prior to Visit  Medication Sig Dispense Refill  . Calcium Carbonate-Vitamin D (CALCIUM + D PO) Take by mouth daily.      Marland Kitchen escitalopram (LEXAPRO) 20 MG tablet Take 20 mg by mouth daily.  1  . metoprolol tartrate (LOPRESSOR) 25 MG tablet Take 1 tablet (25 mg total) by mouth 3 (three) times daily. 270 tablet 3  . omeprazole-sodium bicarbonate (ZEGERID) 40-1100 MG per capsule Take 1 capsule by mouth daily.      . OXcarbazepine (TRILEPTAL) 150 MG tablet Take 1 tablet (150 mg total) by mouth 2 (two) times daily. 60 tablet 6  . aspirin 81 MG tablet Take 81 mg by mouth daily.      . Rivaroxaban 15 & 20 MG TBPK Take as directed on package: Start with one 15mg  tablet by mouth twice a day with food. On Day 22, switch to one 20mg  tablet once a day with food. 51 each 0  . naproxen (NAPROSYN) 500 MG tablet      No facility-administered medications prior to visit.     ROS Review of Systems  Constitutional: Negative.  Negative for appetite change, diaphoresis, fatigue and unexpected weight change.  HENT: Negative.  Negative for sinus pressure and trouble swallowing.   Eyes: Negative.  Negative for visual  disturbance.  Respiratory: Negative for cough, choking, chest tightness, shortness of breath and wheezing.   Cardiovascular: Negative for chest pain, palpitations and leg swelling.  Gastrointestinal: Positive for abdominal pain. Negative for constipation, diarrhea, nausea and vomiting.  Endocrine: Negative.   Genitourinary: Negative.  Negative for decreased urine volume, difficulty urinating, dysuria, frequency, hematuria and urgency.  Musculoskeletal: Positive for back pain. Negative for arthralgias, joint swelling, myalgias and neck pain.  Skin: Negative.  Negative for pallor and rash.  Allergic/Immunologic: Negative.   Neurological: Negative.  Negative for dizziness, weakness and numbness.  Hematological: Negative for adenopathy. Does not bruise/bleed easily.  Psychiatric/Behavioral: Negative.     Objective:  BP 132/72 (BP Location: Left Arm, Patient Position: Sitting, Cuff Size: Large)   Pulse (!) 59   Temp 98.5 F (36.9 C) (Oral)   Resp 16   Ht 6\' 2"  (1.88 m)   Wt 254 lb 4 oz (115.3 kg)   SpO2 98%   BMI 32.64 kg/m   BP Readings from Last 3 Encounters:  08/20/17 132/72  07/28/17 110/64  03/17/17 130/80    Wt Readings from Last 3 Encounters:  08/20/17 254 lb 4 oz (115.3 kg)  07/28/17 250 lb (113.4 kg)  03/17/17 246 lb 6.4 oz (111.8 kg)    Physical Exam  Constitutional: He is oriented to person, place,  and time.  Non-toxic appearance. He does not have a sickly appearance. He does not appear ill. No distress.  HENT:  Mouth/Throat: Oropharynx is clear and moist. No oropharyngeal exudate.  Eyes: Conjunctivae are normal. Left eye exhibits no discharge. No scleral icterus.  Neck: Normal range of motion. Neck supple. No JVD present. No thyromegaly present.  Cardiovascular: Normal rate, regular rhythm and normal heart sounds. Exam reveals no gallop and no friction rub.  No murmur heard. Pulmonary/Chest: Effort normal and breath sounds normal. No respiratory distress. He has  no wheezes. He has no rales.  Abdominal: Soft. Bowel sounds are normal. He exhibits no distension and no mass. There is no tenderness. There is no guarding.  Musculoskeletal: Normal range of motion. He exhibits no edema, tenderness or deformity.       Lumbar back: Normal. He exhibits normal range of motion, no tenderness, no bony tenderness, no swelling, no edema and no deformity.  Lymphadenopathy:    He has no cervical adenopathy.  Neurological: He is alert and oriented to person, place, and time. He has normal reflexes.  Neg SLR in BLE  Skin: Skin is warm and dry. No rash noted. He is not diaphoretic. No erythema. No pallor.  Vitals reviewed.   Lab Results  Component Value Date   WBC 4.9 08/20/2017   HGB 13.5 08/20/2017   HCT 40.4 08/20/2017   PLT 202.0 08/20/2017   GLUCOSE 85 08/20/2017   CHOL 179 09/06/2015   TRIG 58 09/06/2015   HDL 54 09/06/2015   LDLCALC 113 09/06/2015   ALT 21 08/20/2017   AST 19 08/20/2017   NA 139 08/20/2017   K 4.5 08/20/2017   CL 103 08/20/2017   CREATININE 1.07 08/20/2017   BUN 16 08/20/2017   CO2 30 08/20/2017   TSH 1.04 09/30/2013   PSA 0.45 09/30/2013   INR 0.97 11/22/2010     2015 - IMPRESSION: 1. Ectatic abdominal aorta at risk for aneurysm development. Recommend follow up by [place] in 5 years. This recommendation follows ACR consensus guidelines: White Paper of the ACR Incidental Findings Committee II on Vascular Findings. J Am Coll Radiol 2013; 10:789-794.   Assessment & Plan:   [name] was seen today for abdominal pain.  Diagnoses and all orders for this visit:  Need for pneumococcal vaccination -     Pneumococcal conjugate vaccine 13-valent  Generalized abdominal pain-his symptoms and exam are not consistent with an acute abdominal process.  He does have a trace of blood in his urine so I discussed with him the possibility about doing a CT scan to see if he has another kidney stone.  However, the day after this visit I spoke to  him he tells me that all of his symptoms have resolved and he did not want to pursue the CT scan.  He will let me know if the abdominal pain returns. -     US Abdomen Complete; Future -     Comprehensive metabolic panel; Future -     CBC with Differential/Platelet; Future -     Urinalysis, Routine w reflex microscopic; Future -     Amylase; Future -     Lipase; Future  Abdominal aortic aneurysm (AAA) without rupture ([place]) - he had an ultrasound done about 4 years ago that showed an ectatic aortic aneurysm.  Since he has had some recent episodes of abdominal pain I have asked him to go and have this rescanned to see if it warrants clinical attention.  Essential  hypertension- His blood pressure is well controlled.  Electrolytes and renal function are normal. -     Comprehensive metabolic panel; Future -     CBC with Differential/Platelet; Future  Acute deep vein thrombosis (DVT) of distal end of left lower extremity ([place])- Based on his symptoms and exam the DVT is regressing.  He will maintain on 20 mg of Xarelto for the next 6 months. -     rivaroxaban (XARELTO) 20 MG TABS tablet; Take 1 tablet (20 mg total) by mouth daily with supper.   I have discontinued Anette Riedel. Wiers's aspirin, naproxen, and Rivaroxaban. I am also having him start on rivaroxaban. Additionally, I am having him maintain his omeprazole-sodium bicarbonate, Calcium Carbonate-Vitamin D (CALCIUM + D PO), OXcarbazepine, metoprolol tartrate, and escitalopram.  Meds ordered this encounter  Medications  . rivaroxaban (XARELTO) 20 MG TABS tablet    Sig: Take 1 tablet (20 mg total) by mouth daily with supper.    Dispense:  90 tablet    Refill:  1     Follow-up: Return in about 1 week (around 08/27/2017).  Scarlette Calico, MD

## 2017-09-05 ENCOUNTER — Ambulatory Visit
Admission: RE | Admit: 2017-09-05 | Discharge: 2017-09-05 | Disposition: A | Discharge status: Home / Self Care | Payer: Medicare HMO | Source: Ambulatory Visit | Attending: Internal Medicine | Admitting: Internal Medicine

## 2017-09-05 DIAGNOSIS — R1084 Generalized abdominal pain: Secondary | ICD-10-CM

## 2017-09-05 DIAGNOSIS — K760 Fatty (change of) liver, not elsewhere classified: Secondary | ICD-10-CM | POA: Diagnosis not present

## 2017-09-08 ENCOUNTER — Encounter: Payer: Self-pay | Admitting: Internal Medicine

## 2017-09-16 ENCOUNTER — Ambulatory Visit (INDEPENDENT_AMBULATORY_CARE_PROVIDER_SITE_OTHER): Payer: Medicare HMO | Admitting: Family Medicine

## 2017-09-16 ENCOUNTER — Encounter: Payer: Self-pay | Admitting: Family Medicine

## 2017-09-16 ENCOUNTER — Ambulatory Visit: Payer: Self-pay | Admitting: *Deleted

## 2017-09-16 VITALS — BP 136/78 | HR 86 | Temp 98.6°F | Ht 74.0 in | Wt 251.0 lb

## 2017-09-16 DIAGNOSIS — R58 Hemorrhage, not elsewhere classified: Secondary | ICD-10-CM

## 2017-09-16 NOTE — Assessment & Plan Note (Signed)
Unclear as to the source of his bleeding. Occurred one time and has ceased. He has been on xarelto for one month for blood clots.  - monitor for now.  - if happens again would obtain CBC w/ diff and CMP.

## 2017-09-16 NOTE — Progress Notes (Signed)
Willie Black - 67 y.o. male MRN [id number]  Date of birth: 03-Dec-1950  SUBJECTIVE:  Including CC & ROS.  Chief Complaint  Patient presents with  . Mouth bleeding    Willie Black is a 67 y.o. male that is presenting with mouth bleeding. He woke up yesterday morning with blood in his mouth and pillow. The bleeding stopped yesterday afternoon around. He began xarelto one month ago. Denies any sores or cuts in his mouth. Denies pain  Denies bleeding gum when he is brushing. He has been noticing shooting facial pain located over his right eyebrow and radiates upwards. He exepericned this in the past. Denies headaches or dizzness.     Review of Systems  Constitutional: Negative for fever.  HENT: Negative for congestion.   Hematological: Negative for adenopathy. Does not bruise/bleed easily.    HISTORY: Past Medical, Surgical, Social, and Family History Reviewed & Updated per EMR.   Pertinent Historical Findings include:  Past Medical History:  Diagnosis Date  . Anxiety   . CAD (coronary artery disease)    a. s/p CABG in 2011 with LIMA-LAD, SVG-OM, and SVG-PDA b. low-risk NST in 08/2015  . GERD (gastroesophageal reflux disease)   . Heart attack ([place]) 2011  . Hypercholesteremia   . PVC (premature ventricular contraction)    Bigeminy PVC's on event monitor Nov 2012  . Renal calculi   . Shingles   . Trigeminal neuralgia of right side of face     Past Surgical History:  Procedure Laterality Date  . CERVICAL DISC SURGERY    . CORONARY ARTERY BYPASS GRAFT  05/28/2110   x3 with LIMA to LAD, SVG to OM and SVG to LAD after having ST elevation with coming off pump    Allergies  Allergen Reactions  . Penicillins Rash    Ulcers in the mouth    Family History  Problem Relation Age of Onset  . Aneurysm Father        abdominal  . Coronary artery disease Father        3 stents  . Heart failure Mother      Social History   Socioeconomic History  . Marital status:  Married    Spouse name: Not on file  . Number of children: 2  . Years of education: 54  . Highest education level: Not on file  Occupational History  . Occupation: Scientist, clinical (histocompatibility and immunogenetics): [employer]  . Financial resource strain: Not on file  . Food insecurity:    Worry: Not on file    Inability: Not on file  . Transportation needs:    Medical: Not on file    Non-medical: Not on file  Tobacco Use  . Smoking status: Former Smoker    Types: Cigarettes    Last attempt to quit: 04/27/2009    Years since quitting: 8.3  . Smokeless tobacco: Never Used  Substance and Sexual Activity  . Alcohol use: No  . Drug use: No  . Sexual activity: Yes  Lifestyle  . Physical activity:    Days per week: Not on file    Minutes per session: Not on file  . Stress: Not on file  Relationships  . Social connections:    Talks on phone: Not on file    Gets together: Not on file    Attends religious service: Not on file    Active member of club or organization: Not on file    Attends meetings  of clubs or organizations: Not on file    Relationship status: Not on file  . Intimate partner violence:    Fear of current or ex partner: Not on file    Emotionally abused: Not on file    Physically abused: Not on file    Forced sexual activity: Not on file  Other Topics Concern  . Not on file  Social History Narrative   Lives at home with his wife.   Right-handed.   2 cups caffeine daily.     PHYSICAL EXAM:  VS: BP 136/78 (BP Location: Left Arm, Patient Position: Sitting, Cuff Size: Normal)   Pulse 86   Temp 98.6 F (37 C) (Oral)   Ht 6\' 2"  (1.88 m)   Wt 251 lb (113.9 kg)   SpO2 97%   BMI 32.23 kg/m  Physical Exam Gen: NAD, alert, cooperative with exam, well-appearing ENT: normal lips, normal nasal mucosa, no oral lesions or cuts, no cervical LAD, no changes in nasal mucosa  Eye: normal EOM, normal conjunctiva and lids CV:  no edema, +2 pedal pulses   Resp: no accessory  muscle use, non-labored,  Skin: no rashes, no areas of induration  Neuro: normal tone, normal sensation to touch Psych:  normal insight, alert and oriented MSK: normal gait, normal strength      ASSESSMENT & PLAN:   Bleeding Unclear as to the source of his bleeding. Occurred one time and has ceased. He has been on xarelto for one month for blood clots.  - monitor for now.  - if happens again would obtain CBC w/ diff and CMP.

## 2017-09-16 NOTE — Patient Instructions (Signed)
Let us know if you have the bleeding again.  If you do then I would consider getting some blood work.

## 2017-09-16 NOTE — Telephone Encounter (Signed)
Pt  Is  On Xarelto   Was   Woken  Up Yesterday    Am   with a  Mouthful  Of  Blood  Took  About  30  mins  To  Get the  Bleeding  Stopped. Also  Reports  Pain r  Side  Of  Face  / forehead   X  Several  Weeks   Being  Treated  For facial  Nerve  Pain  By a  Neurologist. No  Availability  With Dr  Ronnald Ramp  . Appointment made  With  Clearance Coots   For today    Reason for Disposition . All other mouth symptoms (Exceptions: dry mouth from not drinking enough liquids, chapped lips)  Answer Assessment - Initial Assessment Questions 1. SYMPTOM: "What's the main symptom you're concerned about?" (e.g., dry mouth. chapped lips, lump)    Bleeding from mouth    2. ONSET: "When did the  ________  start?"     Yesterday  Am   3. PAIN: "Is there any pain?" If so, ask: "How bad is it?" (Scale: 1-10; mild, moderate, severe)     Pain  r   Side  Head   X   2  Weeks  From  Trigeminal  Neuralgia  beeing  Weaned  Off  The  Pain med   4. CAUSE: "What do you think is causing the symptoms?"        Pt  Is  On a  Blood  Thinner   5. OTHER SYMPTOMS: "Do you have any other symptoms?" (e.g., fever, sore throat, toothache, swelling)        Woke  Up  3  Am    yest   With   Blood in mouth pillow  Mouth  Continued  To bleed for  About  30  mins   Pt put gauze  And  Ice  Water to  Get  It  To  Stop   No  furthur  Bleeding   Or  Other  Symptoms    6. PREGNANCY: "Is there any chance you are pregnant?" "When was your last menstrual period?"    n/a  Protocols used: MOUTH [facility]

## 2017-11-21 DIAGNOSIS — M47816 Spondylosis without myelopathy or radiculopathy, lumbar region: Secondary | ICD-10-CM | POA: Diagnosis not present

## 2017-11-21 DIAGNOSIS — R51 Headache: Secondary | ICD-10-CM | POA: Diagnosis not present

## 2017-11-21 DIAGNOSIS — G894 Chronic pain syndrome: Secondary | ICD-10-CM | POA: Diagnosis not present

## 2017-11-21 DIAGNOSIS — G500 Trigeminal neuralgia: Secondary | ICD-10-CM | POA: Diagnosis not present

## 2018-02-20 DIAGNOSIS — M47816 Spondylosis without myelopathy or radiculopathy, lumbar region: Secondary | ICD-10-CM | POA: Diagnosis not present

## 2018-02-20 DIAGNOSIS — G500 Trigeminal neuralgia: Secondary | ICD-10-CM | POA: Diagnosis not present

## 2018-02-20 DIAGNOSIS — R51 Headache: Secondary | ICD-10-CM | POA: Diagnosis not present

## 2018-02-20 DIAGNOSIS — G5713 Meralgia paresthetica, bilateral lower limbs: Secondary | ICD-10-CM | POA: Diagnosis not present

## 2018-02-25 ENCOUNTER — Other Ambulatory Visit: Payer: Self-pay | Admitting: Internal Medicine

## 2018-02-25 DIAGNOSIS — I824Z2 Acute embolism and thrombosis of unspecified deep veins of left distal lower extremity: Secondary | ICD-10-CM

## 2018-02-25 DIAGNOSIS — D6859 Other primary thrombophilia: Secondary | ICD-10-CM

## 2018-02-25 MED ORDER — RIVAROXABAN 10 MG PO TABS: 10.0000 mg | ORAL_TABLET | Freq: Every day | ORAL | 1 refills | Status: DC

## 2018-04-02 DIAGNOSIS — G5713 Meralgia paresthetica, bilateral lower limbs: Secondary | ICD-10-CM | POA: Diagnosis not present

## 2018-04-02 DIAGNOSIS — R51 Headache: Secondary | ICD-10-CM | POA: Diagnosis not present

## 2018-04-02 DIAGNOSIS — M47816 Spondylosis without myelopathy or radiculopathy, lumbar region: Secondary | ICD-10-CM | POA: Diagnosis not present

## 2018-04-02 DIAGNOSIS — G500 Trigeminal neuralgia: Secondary | ICD-10-CM | POA: Diagnosis not present

## 2018-04-10 ENCOUNTER — Other Ambulatory Visit: Payer: Self-pay | Admitting: Student

## 2018-04-27 ENCOUNTER — Other Ambulatory Visit: Payer: Self-pay | Admitting: Internal Medicine

## 2018-04-27 ENCOUNTER — Telehealth: Payer: Self-pay | Admitting: Internal Medicine

## 2018-04-27 DIAGNOSIS — D6859 Other primary thrombophilia: Secondary | ICD-10-CM

## 2018-04-27 NOTE — Telephone Encounter (Signed)
Copied from [place] [phone]. Topic: Quick Communication - Rx Refill/Question >> Apr 27, 2018 10:20 AM Scherrie Gerlach wrote: Medication: rivaroxaban (XARELTO) 10 MG TABS tablet Pt states he finished the 20 mg yesterday and thought he was to only stay on this med 6 months.  Pt states it has been 6 months, and wants to know if he should have picked up the 10 mg sent in on 9/04? Pt wants to know if he is to continue to take this med?

## 2018-04-27 NOTE — Telephone Encounter (Signed)
Yes, he needs to stay on 10 mg QD to prevent another clot

## 2018-04-28 ENCOUNTER — Telehealth: Payer: Self-pay | Admitting: Internal Medicine

## 2018-04-28 NOTE — Telephone Encounter (Signed)
Copied from [place] [id number]. Topic: General - Other >> Apr 28, 2018  3:53 PM Janace Aris A wrote: Medication: rivaroxaban (XARELTO) 10 MG TABS tablet  Has the patient contacted their pharmacy? Yes   Preferred Pharmacy (with phone number or street name):   [id number] [address], [place].  [phone] (Phone) [phone] (Fax)    Agent: Please be advised that RX refills may take up to 3 business days. We ask that you follow-up with your pharmacy.

## 2018-04-28 NOTE — Telephone Encounter (Signed)
Call to pharmacy- patient has refills on file- they will fill for him.

## 2018-04-29 NOTE — Telephone Encounter (Signed)
Spoke with pt to inform.  

## 2018-05-26 DIAGNOSIS — R0981 Nasal congestion: Secondary | ICD-10-CM | POA: Diagnosis not present

## 2018-05-26 DIAGNOSIS — R030 Elevated blood-pressure reading, without diagnosis of hypertension: Secondary | ICD-10-CM | POA: Diagnosis not present

## 2018-05-26 DIAGNOSIS — R05 Cough: Secondary | ICD-10-CM | POA: Diagnosis not present

## 2018-05-26 DIAGNOSIS — R070 Pain in throat: Secondary | ICD-10-CM | POA: Diagnosis not present

## 2018-06-01 DIAGNOSIS — G5713 Meralgia paresthetica, bilateral lower limbs: Secondary | ICD-10-CM | POA: Diagnosis not present

## 2018-06-01 DIAGNOSIS — M47816 Spondylosis without myelopathy or radiculopathy, lumbar region: Secondary | ICD-10-CM | POA: Diagnosis not present

## 2018-06-01 DIAGNOSIS — G500 Trigeminal neuralgia: Secondary | ICD-10-CM | POA: Diagnosis not present

## 2018-06-01 DIAGNOSIS — R51 Headache: Secondary | ICD-10-CM | POA: Diagnosis not present

## 2018-07-15 ENCOUNTER — Other Ambulatory Visit (INDEPENDENT_AMBULATORY_CARE_PROVIDER_SITE_OTHER): Payer: Medicare HMO

## 2018-07-15 ENCOUNTER — Encounter: Payer: Self-pay | Admitting: Internal Medicine

## 2018-07-15 ENCOUNTER — Ambulatory Visit (INDEPENDENT_AMBULATORY_CARE_PROVIDER_SITE_OTHER): Payer: Medicare HMO | Admitting: Internal Medicine

## 2018-07-15 VITALS — BP 130/62 | HR 65 | Temp 98.4°F | Ht 74.0 in | Wt 260.0 lb

## 2018-07-15 DIAGNOSIS — N400 Enlarged prostate without lower urinary tract symptoms: Secondary | ICD-10-CM

## 2018-07-15 DIAGNOSIS — I10 Essential (primary) hypertension: Secondary | ICD-10-CM

## 2018-07-15 DIAGNOSIS — E785 Hyperlipidemia, unspecified: Secondary | ICD-10-CM

## 2018-07-15 DIAGNOSIS — D492 Neoplasm of unspecified behavior of bone, soft tissue, and skin: Secondary | ICD-10-CM | POA: Diagnosis not present

## 2018-07-15 DIAGNOSIS — G4451 Hemicrania continua: Secondary | ICD-10-CM | POA: Insufficient documentation

## 2018-07-15 DIAGNOSIS — G500 Trigeminal neuralgia: Secondary | ICD-10-CM | POA: Diagnosis not present

## 2018-07-15 DIAGNOSIS — Z0000 Encounter for general adult medical examination without abnormal findings: Secondary | ICD-10-CM

## 2018-07-15 DIAGNOSIS — I2510 Atherosclerotic heart disease of native coronary artery without angina pectoris: Secondary | ICD-10-CM

## 2018-07-15 DIAGNOSIS — Z0001 Encounter for general adult medical examination with abnormal findings: Secondary | ICD-10-CM

## 2018-07-15 LAB — LIPID PANEL
Cholesterol: 213 mg/dL — ABNORMAL HIGH (ref 0–200)
HDL: 55.2 mg/dL (ref >39.00)
LDL Cholesterol: 125 mg/dL — ABNORMAL HIGH (ref 0–99)
NonHDL: 157.59
Total CHOL/HDL Ratio: 4
Triglycerides: 162 mg/dL — ABNORMAL HIGH (ref 0.0–149.0)
VLDL: 32.4 mg/dL (ref 0.0–40.0)

## 2018-07-15 LAB — CBC WITH DIFFERENTIAL/PLATELET
Basophils Absolute: 0.1 10*3/uL (ref 0.0–0.1)
Basophils Relative: 1.4 % (ref 0.0–3.0)
Eosinophils Absolute: 0.2 10*3/uL (ref 0.0–0.7)
Eosinophils Relative: 3.1 % (ref 0.0–5.0)
HCT: 40.6 % (ref 39.0–52.0)
Hemoglobin: 13.6 g/dL (ref 13.0–17.0)
Lymphocytes Relative: 35.6 % (ref 12.0–46.0)
Lymphs Abs: 1.7 10*3/uL (ref 0.7–4.0)
MCHC: 33.4 g/dL (ref 30.0–36.0)
MCV: 94.2 fl (ref 78.0–100.0)
Monocytes Absolute: 0.7 10*3/uL (ref 0.1–1.0)
Monocytes Relative: 13.7 % — ABNORMAL HIGH (ref 3.0–12.0)
Neutro Abs: 2.2 10*3/uL (ref 1.4–7.7)
Neutrophils Relative %: 46.2 % (ref 43.0–77.0)
Platelets: 206 10*3/uL (ref 150.0–400.0)
RBC: 4.31 Mil/uL (ref 4.22–5.81)
RDW: 14.5 % (ref 11.5–15.5)
WBC: 4.9 10*3/uL (ref 4.0–10.5)

## 2018-07-15 LAB — PSA: PSA: 0.64 ng/mL (ref 0.10–4.00)

## 2018-07-15 LAB — COMPREHENSIVE METABOLIC PANEL
ALT: 17 U/L (ref 0–53)
AST: 17 U/L (ref 0–37)
Albumin: 4.2 g/dL (ref 3.5–5.2)
Alkaline Phosphatase: 80 U/L (ref 39–117)
BUN: 15 mg/dL (ref 6–23)
CO2: 29 mEq/L (ref 19–32)
Calcium: 9.3 mg/dL (ref 8.4–10.5)
Chloride: 99 mEq/L (ref 96–112)
Creatinine, Ser: 1.11 mg/dL (ref 0.40–1.50)
GFR: 66.18 mL/min (ref >60.00)
Glucose, Bld: 72 mg/dL (ref 70–99)
Potassium: 3.9 mEq/L (ref 3.5–5.1)
Sodium: 137 mEq/L (ref 135–145)
Total Bilirubin: 0.4 mg/dL (ref 0.2–1.2)
Total Protein: 6.8 g/dL (ref 6.0–8.3)

## 2018-07-15 LAB — SEDIMENTATION RATE: Sed Rate: 11 mm/hr (ref 0–20)

## 2018-07-15 LAB — TSH: TSH: 0.98 u[IU]/mL (ref 0.35–4.50)

## 2018-07-15 MED ORDER — BACLOFEN 10 MG PO TABS: 10.0000 mg | ORAL_TABLET | Freq: Three times a day (TID) | ORAL | 3 refills | Status: DC

## 2018-07-15 MED ORDER — ROSUVASTATIN CALCIUM 20 MG PO TABS: 20.0000 mg | ORAL_TABLET | Freq: Every day | ORAL | 1 refills | Status: DC

## 2018-07-15 NOTE — Patient Instructions (Signed)

## 2018-07-15 NOTE — Progress Notes (Signed)
Subjective:  Patient ID: Willie Black, male    DOB: 1951-05-07  Age: 68 y.o. MRN: [id number]  CC: Annual Exam and Headache   HPI Willie Black presents for a CPX.  He was diagnosed with trigeminal neuralgia about 2 years ago.  For the last 6 months he has had worsening pain on his right scalp into his right face and his right eye.  He has been seeing a pain specialist named Dr. Hardin Negus.  He has been trying increasing doses of Trileptal with minimal improvement.  He is also concerned about a skin lesion on his left lower abdomen that has been growing over the last year.  Past Medical History:  Diagnosis Date  . Anxiety   . CAD (coronary artery disease)    a. s/p CABG in 2011 with LIMA-LAD, SVG-OM, and SVG-PDA b. low-risk NST in 08/2015  . GERD (gastroesophageal reflux disease)   . Heart attack ([place]) 2011  . Hypercholesteremia   . PVC (premature ventricular contraction)    Bigeminy PVC's on event monitor Nov 2012  . Renal calculi   . Shingles   . Trigeminal neuralgia of right side of face    Past Surgical History:  Procedure Laterality Date  . CERVICAL DISC SURGERY    . CORONARY ARTERY BYPASS GRAFT  05/28/2110   x3 with LIMA to LAD, SVG to OM and SVG to LAD after having ST elevation with coming off pump    reports that he quit smoking about 9 years ago. His smoking use included cigarettes. He has never used smokeless tobacco. He reports that he does not drink alcohol or use drugs. family history includes Aneurysm in his father; Coronary artery disease in his father; Heart failure in his mother. Allergies  Allergen Reactions  . Penicillins Rash    Ulcers in the mouth   Outpatient Medications Prior to Visit  Medication Sig Dispense Refill  . Calcium Carbonate-Vitamin D (CALCIUM + D PO) Take by mouth daily.      Marland Kitchen escitalopram (LEXAPRO) 20 MG tablet Take 20 mg by mouth daily.  1  . metoprolol tartrate (LOPRESSOR) 25 MG tablet TAKE 1 TABLET BY MOUTH THREE TIMES  A DAY 270 tablet 3  . OXcarbazepine (TRILEPTAL) 150 MG tablet Take 1 tablet (150 mg total) by mouth 2 (two) times daily. (Patient taking differently: Take 150 mg by mouth 6 (six) times daily. ) 60 tablet 6  . rivaroxaban (XARELTO) 10 MG TABS tablet Take 1 tablet (10 mg total) by mouth daily. 90 tablet 1  . omeprazole-sodium bicarbonate (ZEGERID) 40-1100 MG per capsule Take 1 capsule by mouth daily.       No facility-administered medications prior to visit.     ROS Review of Systems  Constitutional: Negative.  Negative for appetite change, diaphoresis, fatigue and unexpected weight change.  HENT: Negative.   Eyes: Negative for photophobia and visual disturbance.  Respiratory: Negative.  Negative for cough, chest tightness, shortness of breath and wheezing.   Cardiovascular: Negative for chest pain, palpitations and leg swelling.  Gastrointestinal: Negative for abdominal pain, blood in stool, constipation, diarrhea, nausea and vomiting.  Endocrine: Negative.   Genitourinary: Negative.  Negative for difficulty urinating.  Musculoskeletal: Negative.  Negative for back pain, myalgias and neck pain.  Skin: Negative.   Neurological: Positive for headaches. Negative for tremors, seizures, syncope, facial asymmetry, speech difficulty, weakness, light-headedness and numbness.  Hematological: Negative for adenopathy. Does not bruise/bleed easily.  Psychiatric/Behavioral: Negative.  Negative for behavioral problems, dysphoric mood,  sleep disturbance and suicidal ideas. The patient is not nervous/anxious.     Objective:  BP 130/62 (BP Location: Left Arm, Patient Position: Sitting, Cuff Size: Large)   Pulse 65   Temp 98.4 F (36.9 C) (Oral)   Ht 6\' 2"  (1.88 m)   Wt 260 lb (117.9 kg)   SpO2 97%   BMI 33.38 kg/m   BP Readings from Last 3 Encounters:  07/15/18 130/62  09/16/17 136/78  08/20/17 132/72    Wt Readings from Last 3 Encounters:  07/15/18 260 lb (117.9 kg)  09/16/17 251 lb (113.9  kg)  08/20/17 254 lb 4 oz (115.3 kg)    Physical Exam Vitals signs reviewed.  Constitutional:      General: He is not in acute distress.    Appearance: He is ill-appearing (winces in pain). He is not toxic-appearing or diaphoretic.  Eyes:     General: No visual field deficit or scleral icterus.    Extraocular Movements: Extraocular movements intact.     Right eye: Normal extraocular motion and no nystagmus.     Left eye: Normal extraocular motion and no nystagmus.     Pupils: Pupils are equal, round, and reactive to light. Pupils are equal.     Right eye: Pupil is round and reactive.     Left eye: Pupil is round and reactive.  Neck:     Musculoskeletal: Normal range of motion and neck supple. No neck rigidity.  Cardiovascular:     Rate and Rhythm: Normal rate.     Heart sounds: Normal heart sounds. No murmur. No friction rub. No gallop.      Comments: EKG ----  Sinus  Rhythm  -Nonspecific QRS widening.   BORDERLINE Pulmonary:     Effort: Pulmonary effort is normal.     Breath sounds: Normal breath sounds. No stridor. No wheezing, rhonchi or rales.  Abdominal:     General: There is no distension.     Palpations: Abdomen is soft. There is no mass.     Tenderness: There is no abdominal tenderness.    Musculoskeletal: Normal range of motion.        General: No swelling or tenderness.  Lymphadenopathy:     Cervical: No cervical adenopathy.  Skin:    General: Skin is warm and dry.     Coloration: Skin is not pale.     Findings: No rash.  Neurological:     Mental Status: He is alert and oriented to person, place, and time.     Cranial Nerves: No cranial nerve deficit, dysarthria or facial asymmetry.     Sensory: Sensation is intact. No sensory deficit.     Motor: No weakness, tremor, abnormal muscle tone or seizure activity.     Coordination: Coordination is intact. Romberg sign negative. Coordination normal.     Deep Tendon Reflexes: Reflexes normal. Babinski sign absent  on the right side. Babinski sign absent on the left side.  Psychiatric:        Mood and Affect: Mood normal.        Speech: Speech normal.        Behavior: Behavior normal.     Lab Results  Component Value Date   WBC 4.9 07/15/2018   HGB 13.6 07/15/2018   HCT 40.6 07/15/2018   PLT 206.0 07/15/2018   GLUCOSE 72 07/15/2018   CHOL 213 (H) 07/15/2018   TRIG 162.0 (H) 07/15/2018   HDL 55.20 07/15/2018   LDLCALC 125 (H) 07/15/2018   ALT  17 07/15/2018   AST 17 07/15/2018   NA 137 07/15/2018   K 3.9 07/15/2018   CL 99 07/15/2018   CREATININE 1.11 07/15/2018   BUN 15 07/15/2018   CO2 29 07/15/2018   TSH 0.98 07/15/2018   PSA 0.64 07/15/2018   INR 0.97 11/22/2010    US Abdomen Complete  Result Date: 09/05/2017 CLINICAL DATA:  Initial evaluation for abdominal pain. History of intra-abdominal aneurysm. EXAM: ABDOMEN ULTRASOUND COMPLETE COMPARISON:  Prior ultrasound from 10/05/2013. FINDINGS: Gallbladder: No gallstones or wall thickening visualized. No sonographic Murphy sign noted by sonographer. Common bile duct: Diameter: 4.2 mm Liver: No focal lesion identified. Increased echogenicity, suggesting steatosis. Portal vein is patent on color Doppler imaging with normal direction of blood flow towards the liver. IVC: No abnormality visualized. Pancreas: Visualized portion unremarkable. Spleen: Size and appearance within normal limits. Right Kidney: Length: 11.2 cm. Echogenicity within normal limits. No mass or hydronephrosis visualized. Left Kidney: Length: 11.9 cm. Echogenicity within normal limits. No mass or hydronephrosis visualized. Abdominal aorta: Aneurysmal dilatation of the distal aorta, measuring up to 3.1 cm in transverse diameter, previously 2.7 cm. Other findings: None. IMPRESSION: 1. No acute abnormality within the abdomen. 2. Aneurysmal dilatation of the infrarenal aorta up to 3.1 cm (previously 2.7 cm in 2015). Recommend followup by ultrasound in 3 years. This recommendation  follows ACR consensus guidelines: White Paper of the ACR Incidental Findings Committee II on Vascular Findings. J Am Coll Radiol 2013; 10:789-794. 3. Hepatic steatosis. Electronically Signed   By: Jeannine Boga M.D.   On: 09/05/2017 13:42    Assessment & Plan:   Willie Black was seen today for annual exam and headache.  Diagnoses and all orders for this visit:  Coronary artery disease involving native coronary artery of native heart without angina pectoris- He has had no recent symptoms of angina.  He has not achieved his LDL goal and it looks like he is not taking a statin.  I have asked him to start a statin for CV risk reduction. -     Lipid panel; Future -     rosuvastatin (CRESTOR) 20 MG tablet; Take 1 tablet (20 mg total) by mouth daily.  Essential hypertension- His blood pressure is adequately well controlled. -     CBC with Differential/Platelet; Future -     Comprehensive metabolic panel; Future -     TSH; Future -     EKG 12-Lead  Hyperlipidemia LDL goal <70- I have asked him to start taking 20 mg of rosuvastatin to try to reach his LDL goal. -     Lipid panel; Future -     TSH; Future -     rosuvastatin (CRESTOR) 20 MG tablet; Take 1 tablet (20 mg total) by mouth daily.  Routine general medical examination at a health care facility  Benign prostatic hyperplasia without lower urinary tract symptoms-his PSA is low.  This is reassuring that he does not have prostate cancer.  He has no symptoms that need to be treated. -     PSA; Future  Trigeminal neuralgia of right side of face-I have ordered an MRI of his brain to make sure he does not have a structural lesion.  He will continue taking the current dose of Trileptal but I have also asked him to try baclofen to relieve the discomfort.  Finally, I think he should still consider undergoing a neurosurgical procedure to treat the pain. -     MR Brain Wo Contrast; Future -  Ambulatory referral to Neurosurgery -     baclofen  (LIORESAL) 10 MG tablet; Take 1 tablet (10 mg total) by mouth 3 (three) times daily.  Hemicrania continua- As above -     MR Brain Wo Contrast; Future -     Sedimentation rate; Future  Atypical squamoproliferative skin lesion -     Ambulatory referral to Dermatology   I have discontinued Nathaneil Canary R. Baldassari's omeprazole-sodium bicarbonate. I am also having him start on baclofen and rosuvastatin. Additionally, I am having him maintain his Calcium Carbonate-Vitamin D (CALCIUM + D PO), OXcarbazepine, escitalopram, rivaroxaban, and metoprolol tartrate.  Meds ordered this encounter  Medications  . baclofen (LIORESAL) 10 MG tablet    Sig: Take 1 tablet (10 mg total) by mouth 3 (three) times daily.    Dispense:  90 tablet    Refill:  3  . rosuvastatin (CRESTOR) 20 MG tablet    Sig: Take 1 tablet (20 mg total) by mouth daily.    Dispense:  90 tablet    Refill:  1   See AVS for instructions about healthy living and anticipatory guidance.  Follow-up: Return in about 3 months (around 10/14/2018).  Scarlette Calico, MD

## 2018-07-16 ENCOUNTER — Encounter: Payer: Self-pay | Admitting: Internal Medicine

## 2018-07-16 NOTE — Assessment & Plan Note (Signed)

## 2018-07-25 ENCOUNTER — Ambulatory Visit
Admission: RE | Admit: 2018-07-25 | Discharge: 2018-07-25 | Disposition: A | Discharge status: Home / Self Care | Payer: Medicare Other | Source: Ambulatory Visit | Attending: Internal Medicine | Admitting: Internal Medicine

## 2018-07-25 DIAGNOSIS — G500 Trigeminal neuralgia: Secondary | ICD-10-CM

## 2018-07-25 DIAGNOSIS — G4451 Hemicrania continua: Secondary | ICD-10-CM

## 2018-07-26 ENCOUNTER — Encounter: Payer: Self-pay | Admitting: Internal Medicine

## 2018-07-26 ENCOUNTER — Other Ambulatory Visit: Payer: Self-pay | Admitting: Internal Medicine

## 2018-07-26 DIAGNOSIS — R9089 Other abnormal findings on diagnostic imaging of central nervous system: Secondary | ICD-10-CM

## 2018-07-26 DIAGNOSIS — G500 Trigeminal neuralgia: Secondary | ICD-10-CM

## 2018-07-27 ENCOUNTER — Telehealth: Payer: Self-pay | Admitting: Internal Medicine

## 2018-07-27 NOTE — Telephone Encounter (Signed)
Copied from [place]. Topic: General - Inquiry >> Jul 27, 2018  2:15 PM Virl Axe D wrote: Reason for CRM: Inez Catalina with [facility] called to speak with referral coordinator. She stated they received referral but the pt had been there previously and was sent to Pain Management. She read the provider notes and they stated they would refer the pt to [facility], so was calling to confirm referral and make sure it wasn't a mixup. Please advise. JU#[phone] >> Jul 27, 2018  2:31 PM Lennox Solders wrote: Pt is calling and he is looking for a referral to general surgery for abnormal brain Mri at [facility].  Pt has already ready seen dr Gordy Clement at [facility] neuro.

## 2018-07-27 NOTE — Telephone Encounter (Signed)
I think he should see a neurologist and a neurosurgeon - I have ordered both of these referrals He did not get much help from pain management

## 2018-07-29 DIAGNOSIS — G894 Chronic pain syndrome: Secondary | ICD-10-CM | POA: Diagnosis not present

## 2018-07-29 DIAGNOSIS — M47816 Spondylosis without myelopathy or radiculopathy, lumbar region: Secondary | ICD-10-CM | POA: Diagnosis not present

## 2018-07-29 DIAGNOSIS — R51 Headache: Secondary | ICD-10-CM | POA: Diagnosis not present

## 2018-07-29 DIAGNOSIS — G500 Trigeminal neuralgia: Secondary | ICD-10-CM | POA: Diagnosis not present

## 2018-07-30 NOTE — Telephone Encounter (Signed)
Spoke with [facility] and states that referral was needed. Stated that pt has appt on Feb 10th.

## 2018-08-03 ENCOUNTER — Ambulatory Visit: Payer: Managed Care, Other (non HMO) | Admitting: Neurology

## 2018-08-03 ENCOUNTER — Encounter: Payer: Self-pay | Admitting: Neurology

## 2018-08-03 VITALS — BP 147/78 | HR 62 | Ht 74.0 in | Wt 260.0 lb

## 2018-08-03 DIAGNOSIS — G500 Trigeminal neuralgia: Secondary | ICD-10-CM

## 2018-08-03 NOTE — Progress Notes (Signed)
PATIENT: Willie Black DOB: 1950/11/10  Chief Complaint  Patient presents with  . Trigeminal Neuralgia    Last seen 08/22/16.  He is here with his wife, Willie Black, for worsening of right-sided facial pain.  He is currently taking Trileptal 450mg  TID which is some helpful for his pain.  He recently had sphenopalatine ganglion blocks that did not relieve his pain.   Marland Kitchen PCP    Willie Lima, MD     HISTORICAL  Willie Black is a 68 years old right-handed male, accompanied by his wife, seen in refer by his primary care physician Dr. Scarlette Black for evaluation of medication management for his right facial pain, initial evaluation was on July 31 2016.    He had a past medical history of hypertension, coronary artery disease, status post bypass in 2011, history of thoracic shingles, right V3 shingles in 2016,  Starting in December 2017, he noticed radiating pain from right ear to right temporal region, sharp transient, triggered by eating, biting down, wind blow on his face, he was evaluated by dentist, ENT, no etiology was found, he was diagnosed with trigeminal neuralgia was getting a titrating dose of Lyrica, currently taking 75 mg 3 times a day, Lyrica initially was helpful, but now less effective, he also noticed 15 pounds of weight gain over the past 2 months, lower extremity bilateral hands edema, now his right facial pain has changed to involving right frontal periorbital region, radiating pain to right skull, allodynia, he described difficulty washing his hair, there was no rash broke out, no vesicles noticed. He denies right visual loss  UPDATE August 22 2016: He is is with his wife at today's clinical visit, we have personally reviewed MRI of the brain with without contrast on August 14 2016, there was no significant abnormality specific there is no right trigeminal ganglion abnormality noticed.  He complains of new-onset right foot, right ankle pain, is taking Lyrica 75 mg  3 times a day, Trileptal 150 mg twice a day, Elavil 25 mg every day, despite medications he continue complains significant allodynia and right supraorbital region, sometimes to the point of crying, also deals with depression anxiety.  UPDATE Aug 03 2018: Accompanied by his wife at today's clinical visit, since last visit in March 2018, he continues to have intermittent right facial pain, despite titrating dose of Trileptal is now taking 150 mg 3 tablets twice a day since February 2020, which did help his pain, mild sleepiness, still works in [employer], before the medication increasing February, he had significant pain, difficulty eating, brushing his teeth,  He was also seen by pain management Dr. Hardin Black, had spheno-ganglion block with limited help, He is scheduled to see neurosurgeon at [facility] soon  REVIEW OF SYSTEMS: Full 14 system review of systems performed and notable only for as above. ALLERGIES: Allergies  Allergen Reactions  . Penicillins Rash    Ulcers in the mouth    HOME MEDICATIONS: Current Outpatient Medications  Medication Sig Dispense Refill  . Calcium Carbonate-Vitamin D (CALCIUM + D PO) Take by mouth daily.      Marland Kitchen escitalopram (LEXAPRO) 20 MG tablet Take 20 mg by mouth daily.  1  . metoprolol tartrate (LOPRESSOR) 25 MG tablet TAKE 1 TABLET BY MOUTH THREE TIMES A DAY (Patient taking differently: Take 25 mg by mouth 2 (two) times daily. ) 270 tablet 3  . OXcarbazepine (TRILEPTAL) 150 MG tablet Take 450 mg by mouth 3 (three) times daily.    Marland Kitchen  rivaroxaban (XARELTO) 10 MG TABS tablet Take 1 tablet (10 mg total) by mouth daily. 90 tablet 1  . rosuvastatin (CRESTOR) 20 MG tablet Take 1 tablet (20 mg total) by mouth daily. 90 tablet 1   No current facility-administered medications for this visit.     PAST MEDICAL HISTORY: Past Medical History:  Diagnosis Date  . Anxiety   . CAD (coronary artery disease)    a. s/p CABG in 2011 with Black-LAD, SVG-OM, and SVG-PDA b.  low-risk NST in 08/2015  . GERD (gastroesophageal reflux disease)   . Heart attack ([place]) 2011  . Hypercholesteremia   . PVC (premature ventricular contraction)    Bigeminy PVC's on event monitor Nov 2012  . Renal calculi   . Shingles   . Trigeminal neuralgia of right side of face     PAST SURGICAL HISTORY: Past Surgical History:  Procedure Laterality Date  . CERVICAL DISC SURGERY    . CORONARY ARTERY BYPASS GRAFT  05/28/2110   x3 with Black to LAD, SVG to OM and SVG to LAD after having ST elevation with coming off pump    FAMILY HISTORY: Family History  Problem Relation Age of Onset  . Aneurysm Father        abdominal  . Coronary artery disease Father        3 stents  . Heart failure Mother     SOCIAL HISTORY:  Social History   Socioeconomic History  . Marital status: Married    Spouse name: Not on file  . Number of children: 2  . Years of education: 10  . Highest education level: Not on file  Occupational History  . Occupation: Scientist, clinical (histocompatibility and immunogenetics): [employer]  . Financial resource strain: Not on file  . Food insecurity:    Worry: Not on file    Inability: Not on file  . Transportation needs:    Medical: Not on file    Non-medical: Not on file  Tobacco Use  . Smoking status: Former Smoker    Types: Cigarettes    Last attempt to quit: 04/27/2009    Years since quitting: 9.2  . Smokeless tobacco: Never Used  Substance and Sexual Activity  . Alcohol use: No  . Drug use: No  . Sexual activity: Yes  Lifestyle  . Physical activity:    Days per week: Not on file    Minutes per session: Not on file  . Stress: Not on file  Relationships  . Social connections:    Talks on phone: Not on file    Gets together: Not on file    Attends religious service: Not on file    Active member of club or organization: Not on file    Attends meetings of clubs or organizations: Not on file    Relationship status: Not on file  . Intimate partner  violence:    Fear of current or ex partner: Not on file    Emotionally abused: Not on file    Physically abused: Not on file    Forced sexual activity: Not on file  Other Topics Concern  . Not on file  Social History Narrative   Lives at home with his wife.   Right-handed.   2 cups caffeine daily.     PHYSICAL EXAM   Vitals:   08/03/18 0819  BP: (!) 147/78  Pulse: 62  Weight: 260 lb (117.9 kg)  Height: 6\' 2"  (1.88 m)    Not  recorded      Body mass index is 33.38 kg/m.  PHYSICAL EXAMNIATION:  Gen: NAD, conversant, well nourised, obese, well groomed                     Cardiovascular: Regular rate rhythm, no peripheral edema, warm, nontender. Eyes: Conjunctivae clear without exudates or hemorrhage Neck: Supple, no carotid bruits. Pulmonary: Clear to auscultation bilaterally   NEUROLOGICAL EXAM:  MENTAL STATUS: Speech:    Speech is normal; fluent and spontaneous with normal comprehension.  Cognition:     Orientation to time, place and person     Normal recent and remote memory     Normal Attention span and concentration     Normal Language, naming, repeating,spontaneous speech     Fund of knowledge   CRANIAL NERVES: CN II: Visual fields are full to confrontation. Pupils are round equal and briskly reactive to light. CN III, IV, VI: extraocular movement are normal. No ptosis. CN V: Facial sensation is intact to pinprick in all 3 divisions bilaterally. Corneal responses are intact, severe radiating pain to right forehead with slight stroke across right supraortibal notch, no significant pain with deep palpitation  CN VII: Face is symmetric with normal eye closure and smile. CN VIII: Hearing is normal to rubbing fingers CN IX, X: Palate elevates symmetrically. Phonation is normal. CN XI: Head turning and shoulder shrug are intact CN XII: Tongue is midline with normal movements and no atrophy.  MOTOR: There is no pronator drift of out-stretched arms. Muscle bulk  and tone are normal. Muscle strength is normal.  REFLEXES: Reflexes are 2+ and symmetric at the biceps, triceps, knees, and ankles. Plantar responses are flexor.  SENSORY: Intact to light touch, pinprick, positional sensation and vibratory sensation are intact in fingers and toes.  COORDINATION: Rapid alternating movements and fine finger movements are intact. There is no dysmetria on finger-to-nose and heel-knee-shin.    GAIT/STANCE: Antalgic due to right ankle pain,   DIAGNOSTIC DATA (LABS, IMAGING, TESTING) - I reviewed patient records, labs, notes, testing and imaging myself where available.   ASSESSMENT AND PLAN  Willie Black is a 68 y.o. male   New-onset right facial neuropathic pain  Involving right supraorbital nerve distribution  But he failed right supraorbital nerve block  Continue  Trileptal 150 mg 3 tablets 3 times a day  Will be seen by [facility] neurosurgeon for potential gamma knife evaluation    Marcial Pacas, M.D. Ph.D.  [facility] [address], [place], [place] [address] Ph: [phone] Fax: [phone]  CC: Willie Lima, MD

## 2018-08-19 DIAGNOSIS — G500 Trigeminal neuralgia: Secondary | ICD-10-CM | POA: Diagnosis not present

## 2018-09-22 DIAGNOSIS — G500 Trigeminal neuralgia: Secondary | ICD-10-CM | POA: Diagnosis not present

## 2018-09-22 DIAGNOSIS — Z510 Encounter for antineoplastic radiation therapy: Secondary | ICD-10-CM | POA: Diagnosis not present

## 2018-10-23 ENCOUNTER — Other Ambulatory Visit: Payer: Self-pay | Admitting: Internal Medicine

## 2018-10-23 DIAGNOSIS — D6859 Other primary thrombophilia: Secondary | ICD-10-CM

## 2019-01-27 ENCOUNTER — Telehealth: Payer: Self-pay | Admitting: Internal Medicine

## 2019-01-27 NOTE — Telephone Encounter (Signed)
Pt states he has been informed that he is in his donut hole and his XARELTO 10 MG TABS tablet will be over $400 per month for the remainder of the year.  Pt wants to know if there is something else that he can take or is there a way he can get samples for the rest of the year.  Pt is completely out as of this morning.

## 2019-01-28 NOTE — Telephone Encounter (Signed)
Spoke to pt and offered forms for the Patient Assistance for the [place].  Pt agreed. I have mailed form to pt.

## 2019-02-18 ENCOUNTER — Encounter: Payer: Self-pay | Admitting: Internal Medicine

## 2019-02-18 ENCOUNTER — Other Ambulatory Visit: Payer: Self-pay | Admitting: Internal Medicine

## 2019-02-18 DIAGNOSIS — I2510 Atherosclerotic heart disease of native coronary artery without angina pectoris: Secondary | ICD-10-CM

## 2019-02-18 DIAGNOSIS — Z1211 Encounter for screening for malignant neoplasm of colon: Secondary | ICD-10-CM | POA: Diagnosis not present

## 2019-02-18 DIAGNOSIS — E785 Hyperlipidemia, unspecified: Secondary | ICD-10-CM

## 2019-03-18 ENCOUNTER — Emergency Department (HOSPITAL_BASED_OUTPATIENT_CLINIC_OR_DEPARTMENT_OTHER): Payer: Medicare Other

## 2019-03-18 ENCOUNTER — Inpatient Hospital Stay (HOSPITAL_BASED_OUTPATIENT_CLINIC_OR_DEPARTMENT_OTHER): Payer: Medicare Other

## 2019-03-18 ENCOUNTER — Inpatient Hospital Stay (HOSPITAL_BASED_OUTPATIENT_CLINIC_OR_DEPARTMENT_OTHER)
Admission: EM | Admit: 2019-03-18 | Discharge: 2019-03-27 | DRG: 165 | Disposition: A | Discharge status: Home / Self Care | Payer: Medicare Other | Attending: Surgery | Admitting: Surgery

## 2019-03-18 ENCOUNTER — Other Ambulatory Visit: Payer: Self-pay

## 2019-03-18 ENCOUNTER — Encounter (HOSPITAL_BASED_OUTPATIENT_CLINIC_OR_DEPARTMENT_OTHER): Payer: Self-pay | Admitting: Emergency Medicine

## 2019-03-18 DIAGNOSIS — I2510 Atherosclerotic heart disease of native coronary artery without angina pectoris: Secondary | ICD-10-CM | POA: Diagnosis present

## 2019-03-18 DIAGNOSIS — Z87891 Personal history of nicotine dependence: Secondary | ICD-10-CM

## 2019-03-18 DIAGNOSIS — Z8249 Family history of ischemic heart disease and other diseases of the circulatory system: Secondary | ICD-10-CM | POA: Diagnosis not present

## 2019-03-18 DIAGNOSIS — Z880 Allergy status to penicillin: Secondary | ICD-10-CM

## 2019-03-18 DIAGNOSIS — E785 Hyperlipidemia, unspecified: Secondary | ICD-10-CM | POA: Diagnosis not present

## 2019-03-18 DIAGNOSIS — Z955 Presence of coronary angioplasty implant and graft: Secondary | ICD-10-CM | POA: Diagnosis not present

## 2019-03-18 DIAGNOSIS — Z9889 Other specified postprocedural states: Secondary | ICD-10-CM

## 2019-03-18 DIAGNOSIS — J9383 Other pneumothorax: Secondary | ICD-10-CM | POA: Diagnosis not present

## 2019-03-18 DIAGNOSIS — I252 Old myocardial infarction: Secondary | ICD-10-CM

## 2019-03-18 DIAGNOSIS — J939 Pneumothorax, unspecified: Secondary | ICD-10-CM | POA: Diagnosis present

## 2019-03-18 DIAGNOSIS — F419 Anxiety disorder, unspecified: Secondary | ICD-10-CM | POA: Diagnosis present

## 2019-03-18 DIAGNOSIS — Z951 Presence of aortocoronary bypass graft: Secondary | ICD-10-CM | POA: Diagnosis not present

## 2019-03-18 DIAGNOSIS — R0489 Hemorrhage from other sites in respiratory passages: Secondary | ICD-10-CM | POA: Diagnosis not present

## 2019-03-18 DIAGNOSIS — R079 Chest pain, unspecified: Secondary | ICD-10-CM | POA: Diagnosis not present

## 2019-03-18 DIAGNOSIS — Z4682 Encounter for fitting and adjustment of non-vascular catheter: Secondary | ICD-10-CM | POA: Diagnosis not present

## 2019-03-18 DIAGNOSIS — I10 Essential (primary) hypertension: Secondary | ICD-10-CM | POA: Diagnosis present

## 2019-03-18 DIAGNOSIS — Z9689 Presence of other specified functional implants: Secondary | ICD-10-CM

## 2019-03-18 DIAGNOSIS — J9811 Atelectasis: Secondary | ICD-10-CM | POA: Diagnosis not present

## 2019-03-18 DIAGNOSIS — J9311 Primary spontaneous pneumothorax: Principal | ICD-10-CM | POA: Diagnosis present

## 2019-03-18 DIAGNOSIS — J9382 Other air leak: Secondary | ICD-10-CM | POA: Diagnosis not present

## 2019-03-18 DIAGNOSIS — Z20828 Contact with and (suspected) exposure to other viral communicable diseases: Secondary | ICD-10-CM | POA: Diagnosis present

## 2019-03-18 DIAGNOSIS — Z86718 Personal history of other venous thrombosis and embolism: Secondary | ICD-10-CM | POA: Diagnosis not present

## 2019-03-18 DIAGNOSIS — E7800 Pure hypercholesterolemia, unspecified: Secondary | ICD-10-CM | POA: Diagnosis not present

## 2019-03-18 DIAGNOSIS — R918 Other nonspecific abnormal finding of lung field: Secondary | ICD-10-CM | POA: Diagnosis not present

## 2019-03-18 DIAGNOSIS — R0989 Other specified symptoms and signs involving the circulatory and respiratory systems: Secondary | ICD-10-CM | POA: Diagnosis not present

## 2019-03-18 DIAGNOSIS — J439 Emphysema, unspecified: Secondary | ICD-10-CM

## 2019-03-18 HISTORY — DX: Pneumothorax, unspecified: J93.9

## 2019-03-18 LAB — BASIC METABOLIC PANEL
Anion gap: 9 (ref 5–15)
BUN: 18 mg/dL (ref 8–23)
CO2: 28 mmol/L (ref 22–32)
Calcium: 9.1 mg/dL (ref 8.9–10.3)
Chloride: 101 mmol/L (ref 98–111)
Creatinine, Ser: 1.03 mg/dL (ref 0.61–1.24)
GFR calc Af Amer: >60 mL/min (ref >60)
GFR calc non Af Amer: >60 mL/min (ref >60)
Glucose, Bld: 104 mg/dL — ABNORMAL HIGH (ref 70–99)
Potassium: 4.6 mmol/L (ref 3.5–5.1)
Sodium: 138 mmol/L (ref 135–145)

## 2019-03-18 LAB — CBC
HCT: 44 % (ref 39.0–52.0)
Hemoglobin: 13.8 g/dL (ref 13.0–17.0)
MCH: 30.5 pg (ref 26.0–34.0)
MCHC: 31.4 g/dL (ref 30.0–36.0)
MCV: 97.3 fL (ref 80.0–100.0)
Platelets: 197 10*3/uL (ref 150–400)
RBC: 4.52 MIL/uL (ref 4.22–5.81)
RDW: 13.7 % (ref 11.5–15.5)
WBC: 3.9 10*3/uL — ABNORMAL LOW (ref 4.0–10.5)
nRBC: 0 % (ref 0.0–0.2)

## 2019-03-18 LAB — SARS CORONAVIRUS 2 BY RT PCR (HOSPITAL ORDER, PERFORMED IN ~~LOC~~ HOSPITAL LAB): SARS Coronavirus 2: NEGATIVE

## 2019-03-18 LAB — TROPONIN I (HIGH SENSITIVITY): Troponin I (High Sensitivity): 3 ng/L (ref <18)

## 2019-03-18 MED ORDER — METOPROLOL TARTRATE 25 MG PO TABS
25.0000 mg | ORAL_TABLET | Freq: Three times a day (TID) | ORAL | Status: DC
  Administered 2019-03-19 – 2019-03-24 (×12): 25 mg via ORAL
  Filled 2019-03-18 (×16): qty 1

## 2019-03-18 MED ORDER — OXYCODONE HCL 5 MG PO TABS
10.0000 mg | ORAL_TABLET | ORAL | Status: DC | PRN
  Administered 2019-03-18 – 2019-03-23 (×9): 10 mg via ORAL
  Filled 2019-03-18 (×9): qty 2

## 2019-03-18 MED ORDER — LIDOCAINE HCL 2 % IJ SOLN
INTRAMUSCULAR | Status: AC
  Administered 2019-03-18: 400 mg via INTRADERMAL
  Filled 2019-03-18: qty 20

## 2019-03-18 MED ORDER — MORPHINE SULFATE (PF) 4 MG/ML IV SOLN
4.0000 mg | Freq: Once | INTRAVENOUS | Status: AC
  Administered 2019-03-18: 4 mg via INTRAVENOUS
  Filled 2019-03-18: qty 1

## 2019-03-18 MED ORDER — MORPHINE SULFATE (PF) 4 MG/ML IV SOLN
4.0000 mg | Freq: Once | INTRAVENOUS | Status: AC
  Administered 2019-03-18: 4 mg via INTRAVENOUS

## 2019-03-18 MED ORDER — LIDOCAINE HCL 2 % IJ SOLN
20.0000 mL | Freq: Once | INTRAMUSCULAR | Status: AC
  Administered 2019-03-18: 11:00:00 400 mg via INTRADERMAL

## 2019-03-18 MED ORDER — ACETAMINOPHEN 325 MG PO TABS
650.0000 mg | ORAL_TABLET | Freq: Four times a day (QID) | ORAL | Status: DC | PRN
  Administered 2019-03-19 – 2019-03-20 (×4): 650 mg via ORAL
  Filled 2019-03-18 (×4): qty 2

## 2019-03-18 MED ORDER — OXCARBAZEPINE 300 MG PO TABS
450.0000 mg | ORAL_TABLET | Freq: Three times a day (TID) | ORAL | Status: DC
  Administered 2019-03-18 – 2019-03-27 (×18): 450 mg via ORAL
  Filled 2019-03-18 (×28): qty 1

## 2019-03-18 MED ORDER — OXYCODONE-ACETAMINOPHEN 5-325 MG PO TABS
1.0000 | ORAL_TABLET | Freq: Once | ORAL | Status: AC
  Administered 2019-03-18: 1 via ORAL
  Filled 2019-03-18: qty 1

## 2019-03-18 MED ORDER — ESCITALOPRAM OXALATE 10 MG PO TABS
20.0000 mg | ORAL_TABLET | Freq: Every day | ORAL | Status: DC
  Administered 2019-03-19 – 2019-03-27 (×8): 20 mg via ORAL
  Filled 2019-03-18 (×2): qty 1
  Filled 2019-03-18 (×2): qty 2
  Filled 2019-03-18 (×3): qty 1
  Filled 2019-03-18: qty 2

## 2019-03-18 MED ORDER — ENOXAPARIN SODIUM 40 MG/0.4ML ~~LOC~~ SOLN
40.0000 mg | SUBCUTANEOUS | Status: DC
  Administered 2019-03-18 – 2019-03-23 (×6): 40 mg via SUBCUTANEOUS
  Filled 2019-03-18 (×6): qty 0.4

## 2019-03-18 MED ORDER — ROSUVASTATIN CALCIUM 20 MG PO TABS
20.0000 mg | ORAL_TABLET | Freq: Every day | ORAL | Status: DC
  Administered 2019-03-19 – 2019-03-27 (×8): 20 mg via ORAL
  Filled 2019-03-18 (×8): qty 1

## 2019-03-18 MED ORDER — MORPHINE SULFATE (PF) 4 MG/ML IV SOLN
INTRAVENOUS | Status: AC
  Filled 2019-03-18: qty 1

## 2019-03-18 NOTE — ED Provider Notes (Addendum)
[name] EMERGENCY DEPARTMENT Provider Note   CSN: [id number] Arrival date & time: 03/18/19  [id number]     History   Chief Complaint Chief Complaint  Patient presents with  . Chest Pain    HPI Willie Black is a 68 y.o. male.  He has a history of hypertension CABG hypercholesterolemia.  He is complaining of acute onset of 8 out of 10 left-sided chest pain that began at 730 this morning after he sneezed.  Says the pain radiates to his shoulder and back.  He does not feel short of breath but it hurts more laying down flat or breathing deeply.  No sweatiness dizziness nausea vomiting.  No abdominal pain.  No numbness or weakness.     The history is provided by the patient.  Chest Pain Pain location:  L chest Pain quality: sharp   Pain radiates to:  L shoulder and upper back Pain severity:  Severe Onset quality:  Sudden Duration:  1 hour Timing:  Constant Progression:  Unchanged Chronicity:  New Context comment:  Sneezed Relieved by:  None tried Worsened by:  Certain positions and deep breathing Ineffective treatments:  None tried Associated symptoms: back pain   Associated symptoms: no abdominal pain, no altered mental status, no anorexia, no claudication, no cough, no diaphoresis, no dizziness, no dysphagia, no fever, no headache, no nausea, no shortness of breath, no syncope and no vomiting   Risk factors: coronary artery disease, high cholesterol, hypertension, male sex and prior DVT/PE     Past Medical History:  Diagnosis Date  . Anxiety   . CAD (coronary artery disease)    a. s/p CABG in 2011 with LIMA-LAD, SVG-OM, and SVG-PDA b. low-risk NST in 08/2015  . GERD (gastroesophageal reflux disease)   . Heart attack ([place]) 2011  . Hypercholesteremia   . PVC (premature ventricular contraction)    Bigeminy PVC's on event monitor Nov 2012  . Renal calculi   . Shingles   . Trigeminal neuralgia of right side of face     Patient Active Problem List    Diagnosis Date Noted  . Abnormal brain MRI 07/26/2018  . Benign prostatic hyperplasia without lower urinary tract symptoms 07/15/2018  . 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 Atypical squamoproliferative skin lesion 07/15/2018  . Hypercoagulable state ([place]) 02/25/2018  . Trigeminal neuralgia of right side of face 05/15/2016  . Other chronic sinusitis 05/01/2016  . PVC's (premature ventricular contractions) 04/19/2014  . Routine general medical examination at a health care facility 09/30/2013  . Abdominal aortic aneurysm ([place]) 09/30/2013  . Essential hypertension 05/15/2011  . GERD (gastroesophageal reflux disease)   . Hyperlipidemia LDL goal <70   . CAD (coronary artery disease)   . PULMONARY NODULE 04/12/2010    Past Surgical History:  Procedure Laterality Date  . CERVICAL DISC SURGERY    . CORONARY ARTERY BYPASS GRAFT  05/28/2110   x3 with LIMA to LAD, SVG to OM and SVG to LAD after having ST elevation with coming off pump        Home Medications    Prior to Admission medications   Medication Sig Start Date End Date Taking? Authorizing Provider  Calcium Carbonate-Vitamin D (CALCIUM + D PO) Take by mouth daily.      [provider]  escitalopram (LEXAPRO) 20 MG tablet Take 20 mg by mouth daily. 07/09/17   [provider]  metoprolol tartrate (LOPRESSOR) 25 MG tablet TAKE 1 TABLET BY MOUTH THREE TIMES A DAY Patient taking differently:  Take 25 mg by mouth 2 (two) times daily.  04/10/18   Martinique, Peter M, MD  OXcarbazepine (TRILEPTAL) 150 MG tablet Take 450 mg by mouth 3 (three) times daily.    [provider]  rosuvastatin (CRESTOR) 20 MG tablet TAKE 1 TABLET BY MOUTH EVERY DAY 02/18/19   Janith Lima, MD  XARELTO 10 MG TABS tablet TAKE 1 TABLET BY MOUTH EVERY DAY 10/23/18   Janith Lima, MD    Family History Family History  Problem Relation Age of Onset  . Aneurysm Father        abdominal  . Coronary artery disease Father        3 stents  .  Heart failure Mother     Social History Social History   Tobacco Use  . Smoking status: Former Smoker    Types: Cigarettes    Quit date: 04/27/2009    Years since quitting: 9.8  . Smokeless tobacco: Never Used  Substance Use Topics  . Alcohol use: No  . Drug use: No     Allergies   Penicillins   Review of Systems Review of Systems  Constitutional: Negative for diaphoresis and fever.  HENT: Negative for sore throat and trouble swallowing.   Eyes: Negative for visual disturbance.  Respiratory: Negative for cough and shortness of breath.   Cardiovascular: Positive for chest pain. Negative for claudication and syncope.  Gastrointestinal: Negative for abdominal pain, anorexia, nausea and vomiting.  Genitourinary: Negative for dysuria.  Musculoskeletal: Positive for back pain.  Skin: Negative for rash.  Neurological: Negative for dizziness and headaches.     Physical Exam Updated Vital Signs BP 107/61   Pulse (!) 50   Temp 98.5 F (36.9 C) (Oral)   Resp 12   Ht 6\' 2"  (1.88 m)   Wt 113.4 kg   SpO2 100%   BMI 32.10 kg/m   Physical Exam Vitals signs and nursing note reviewed.  Constitutional:      Appearance: He is well-developed.  HENT:     Head: Normocephalic and atraumatic.  Eyes:     Conjunctiva/sclera: Conjunctivae normal.  Neck:     Musculoskeletal: Neck supple.  Cardiovascular:     Rate and Rhythm: Normal rate and regular rhythm.     Heart sounds: Normal heart sounds. No murmur.  Pulmonary:     Effort: Pulmonary effort is normal. No respiratory distress.     Breath sounds: Examination of the left-upper field reveals decreased breath sounds. Examination of the left-middle field reveals decreased breath sounds. Examination of the left-lower field reveals decreased breath sounds. Decreased breath sounds present.  Abdominal:     Palpations: Abdomen is soft. There is no mass.     Tenderness: There is no abdominal tenderness. There is no guarding.   Musculoskeletal:     Right lower leg: He exhibits no tenderness. Edema present.     Left lower leg: He exhibits no tenderness. Edema present.  Skin:    General: Skin is warm and dry.     Capillary Refill: Capillary refill takes less than 2 seconds.  Neurological:     General: No focal deficit present.     Mental Status: He is alert.      ED Treatments / Results  Labs (all labs ordered are listed, but only abnormal results are displayed) Labs Reviewed  BASIC METABOLIC PANEL - Abnormal; Notable for the following components:      Result Value   Glucose, Bld 104 (*)    All  other components within normal limits  CBC - Abnormal; Notable for the following components:   WBC 3.9 (*)    All other components within normal limits  SARS CORONAVIRUS 2 (HOSPITAL ORDER, [facility])  TROPONIN I (HIGH SENSITIVITY)  TROPONIN I (HIGH SENSITIVITY)    EKG EKG Interpretation  Date/Time:  Thursday March 18 2019 08:30:38 EDT Ventricular Rate:  60 PR Interval:    QRS Duration: 114 QT Interval:  460 QTC Calculation: 460 R Axis:   -149 Text Interpretation:  Sinus rhythm Borderline intraventricular conduction delay Probable right ventricular hypertrophy similar to prior less ectopy 7/13 Confirmed by Aletta Edouard [phone]) on 03/18/2019 8:36:31 AM   Radiology Dg Chest 1v Repeat Same Day  Result Date: 03/18/2019 CLINICAL DATA:  Chest tube placement EXAM: CHEST - 1 VIEW SAME DAY COMPARISON:  03/18/2027 8:47 a.m. FINDINGS: There is been interval placement of a left-sided chest tube. The chest tube projects over the left upper lung zone. The previously noted left-sided pneumothorax is nearly entirely resolved. The heart size remains enlarged. The patient is status post prior median sternotomy. There is no significant pleural effusion. IMPRESSION: Status post placement of a left-sided chest tube with near complete resolution of the left-sided pneumothorax. Electronically  Signed   By: Constance Holster M.D.   On: 03/18/2019 12:04   Dg Chest Port 1 View  Result Date: 03/18/2019 CLINICAL DATA:  Onset left chest pain this morning after sneezing. EXAM: PORTABLE CHEST 1 VIEW COMPARISON:  Single-view of the chest and CT chest 11/22/2010. FINDINGS: The patient has a moderately large left pneumothorax estimated at 60-70%. No midline shift. The right lung is expanded and clear. Lungs are emphysematous. The patient is status post CABG. Heart size is upper normal. IMPRESSION: Left pneumothorax estimated at 60-70%. Emphysema. Critical Value/emergent results were called by telephone at the time of interpretation on 03/18/2019 at 9:16 am to providerMICHAEL Mimi Debellis , who verbally acknowledged these results. Electronically Signed   By: Inge Rise M.D.   On: 03/18/2019 09:16    Procedures CHEST TUBE INSERTION  Date/Time: 03/18/2019 1:49 PM Performed by: Hayden Rasmussen, MD Authorized by: Hayden Rasmussen, MD   Consent:    Consent obtained:  Written   Consent given by:  Patient   Risks discussed:  Bleeding, pain, infection and damage to surrounding structures   Alternatives discussed:  Delayed treatment Universal protocol:    Procedure explained and questions answered to patient or proxy's satisfaction: yes     Relevant documents present and verified: yes     Imaging studies available: yes     Required blood products, implants, devices, and special equipment available: yes     Site/side marked: yes     Immediately prior to procedure a time out was called: yes     Patient identity confirmed:  Verbally with patient and arm band Pre-procedure details:    Skin preparation:  ChloraPrep   Preparation: Patient was prepped and draped in the usual sterile fashion   Anesthesia (see MAR for exact dosages):    Anesthesia method:  Local infiltration   Local anesthetic:  Lidocaine 2% w/o epi Procedure details:    Placement location:  L lateral   Tube size (Fr):   Minicatheter   Ultrasound guidance: yes     Tension pneumothorax: no     Tube connected to:  Suction   Drainage characteristics:  Bloody   Suture material:  0 silk Post-procedure details:    Post-insertion x-ray findings:  tube in good position     Patient tolerance of procedure:  Tolerated well, no immediate complications .Critical Care Performed by: Hayden Rasmussen, MD Authorized by: Hayden Rasmussen, MD   Critical care provider statement:    Critical care time (minutes):  45   Critical care time was exclusive of:  Separately billable procedures and treating other patients   Critical care was necessary to treat or prevent imminent or life-threatening deterioration of the following conditions:  Respiratory failure   Critical care was time spent personally by me on the following activities:  Discussions with consultants, evaluation of patient's response to treatment, examination of patient, ordering and performing treatments and interventions, ordering and review of laboratory studies, ordering and review of radiographic studies, pulse oximetry, re-evaluation of patient's condition, obtaining history from patient or surrogate, review of old charts and development of treatment plan with patient or surrogate   I assumed direction of critical care for this patient from another provider in my specialty: no     (including critical care time)  Medications Ordered in ED Medications  morphine 4 MG/ML injection 4 mg (4 mg Intravenous Given 03/18/19 1006)  morphine 4 MG/ML injection 4 mg (4 mg Intravenous Given 03/18/19 1115)  lidocaine (XYLOCAINE) 2 % (with pres) injection 400 mg (400 mg Intradermal Given 03/18/19 1100)     Initial Impression / Assessment and Plan / ED Course  I have reviewed the triage vital signs and the nursing notes.  Pertinent labs & imaging results that were available during my care of the patient were reviewed by me and considered in my medical decision making (see  chart for details).  Clinical Course as of Mar 17 1348  Thu Mar 18, 2019  0828 3/17 Myoview - Study Highlights   The left ventricular ejection fraction is normal (55-65%).  Nuclear stress EF: 63%.  Blood pressure demonstrated a hypertensive response to exercise.  Upsloping ST segment depression ST segment depression was noted during stress in the II, III and aVF leads.  The study is normal.  This is a low risk study.     [MB]  [id number] 68 year old male with known coronary disease and multiple cardiac risk factors here with acute onset of left-sided chest pain this morning at 7:30 AM.  Differential diagnosis includes ACS, pneumothorax, pneumonia, vascular disease.  On clinical exam he has diminished breath sounds in the left side.  Stat portable chest x-ray reveals a moderate size left pneumothorax.  Will place a chest tube set up at the bedside.  Placed on oxygen.  Updated patient.  Awaiting radiology reading and will discuss with CT surgery.   [MB]  [phone] Received a call from radiology reporting large left pneumothorax.  He also reviewed prior CT and noted that he does have bullous disease in his apices.   [MB]  [phone] Consult placed to cardiothoracic surgery.   [MB]  [id number] Discussed with Thurmond Butts, [place] from cardiothoracic surgery.  She reviewed the case with Dr. Wallace Going who is going to be accepting the patient for admission over to [facility].  They asked if we would place a pigtail catheter in prior to transfer.  Will review with patient.   [MB]    Clinical Course User Index [MB] Hayden Rasmussen, MD        Final Clinical Impressions(s) / ED Diagnoses   Final diagnoses:  Primary spontaneous pneumothorax    ED Discharge Orders    None       Hayden Rasmussen,  MD 03/18/19 1351    Hayden Rasmussen, MD 03/18/19 [phone]

## 2019-03-18 NOTE — ED Notes (Signed)
ED TO INPATIENT HANDOFF REPORT  ED Nurse Name and Phone #:  Lurene Shadow, RN; [phone]  S Name/Age/Gender Willie Black 68 y.o. male Room/Bed: MHT14/MHT14  Code Status   Code Status: Not on file  Home/SNF/Other Home Patient oriented to: self, place, time and situation Is this baseline? Yes   Triage Complete: Triage complete  Chief Complaint CHEST PAIN AND SOB  Triage Note Left sided chest pain, acute onset this morning after sneezing.  Pt states he is having some diff breathing. Denies travel.   Allergies Allergies  Allergen Reactions  . Penicillins Rash    Ulcers in the mouth    Level of Care/Admitting Diagnosis ED Disposition    ED Disposition Condition [place]: [place] [[id number]]  Level of Care: Telemetry Surgical [105]  Covid Evaluation: Confirmed COVID Negative  Diagnosis: Pneumothorax on left [id number]  Admitting Physician: Murray Hodgkins  Attending Physician: Gaye Pollack [[id number]]  Estimated length of stay: past midnight tomorrow  Certification:: I certify this patient will need inpatient services for at least 2 midnights  PT Class (Do Not Modify): Inpatient [101]  PT Acc Code (Do Not Modify): Private [1]       B Medical/Surgery History Past Medical History:  Diagnosis Date  . Anxiety   . CAD (coronary artery disease)    a. s/p CABG in 2011 with LIMA-LAD, SVG-OM, and SVG-PDA b. low-risk NST in 08/2015  . GERD (gastroesophageal reflux disease)   . Heart attack ([place]) 2011  . Hypercholesteremia   . PVC (premature ventricular contraction)    Bigeminy PVC's on event monitor Nov 2012  . Renal calculi   . Shingles   . Trigeminal neuralgia of right side of face    Past Surgical History:  Procedure Laterality Date  . CERVICAL DISC SURGERY    . CORONARY ARTERY BYPASS GRAFT  05/28/2110   x3 with LIMA to LAD, SVG to OM and SVG to LAD after having ST elevation with coming off pump     A IV  Location/Drains/Wounds Patient Lines/Drains/Airways Status   Active Line/Drains/Airways    Name:   Placement date:   Placement time:   Site:   Days:   Peripheral IV 03/18/19 Left Antecubital   03/18/19    0900    Antecubital   less than 1          Intake/Output Last 24 hours No intake or output data in the 24 hours ending 03/18/19 1602  Labs/Imaging Results for orders placed or performed during the hospital encounter of 03/18/19 (from the past 48 hour(s))  Basic metabolic panel     Status: Abnormal   Collection Time: 03/18/19  8:58 AM  Result Value Ref Range   Sodium 138 135 - 145 mmol/L   Potassium 4.6 3.5 - 5.1 mmol/L   Chloride 101 98 - 111 mmol/L   CO2 28 22 - 32 mmol/L   Glucose, Bld 104 (H) 70 - 99 mg/dL   BUN 18 8 - 23 mg/dL   Creatinine, Ser 1.03 0.61 - 1.24 mg/dL   Calcium 9.1 8.9 - 10.3 mg/dL   GFR calc non Af Amer >60 >60 mL/min   GFR calc Af Amer >60 >60 mL/min   Anion gap 9 5 - 15    Comment: Performed at [facility], [place], [place] [address]  CBC     Status: Abnormal   Collection Time: 03/18/19  8:58 AM  Result Value Ref Range   WBC 3.9 (L) 4.0 - 10.5 K/uL   RBC 4.52 4.22 - 5.81 MIL/uL   Hemoglobin 13.8 13.0 - 17.0 g/dL   HCT 44.0 39.0 - 52.0 %   MCV 97.3 80.0 - 100.0 fL   MCH 30.5 26.0 - 34.0 pg   MCHC 31.4 30.0 - 36.0 g/dL   RDW 13.7 11.5 - 15.5 %   Platelets 197 150 - 400 K/uL   nRBC 0.0 0.0 - 0.2 %    Comment: Performed at [facility], [address] [place], [place] [address]  Troponin I (High Sensitivity)     Status: None   Collection Time: 03/18/19  8:58 AM  Result Value Ref Range   Troponin I (High Sensitivity) 3 <18 ng/L    Comment: (NOTE) Elevated high sensitivity troponin I (hsTnI) values and significant  changes across serial measurements may suggest ACS but many other  chronic and acute conditions are known to elevate hsTnI results.  Refer to the "Links" section for chest pain algorithms and  additional  guidance. Performed at [facility], [address] [place], [place] [address]   SARS Coronavirus 2 [facility] order, Performed in [facility] hospital lab) Nasopharyngeal Nasopharyngeal Swab     Status: None   Collection Time: 03/18/19  9:03 AM   Specimen: Nasopharyngeal Swab  Result Value Ref Range   SARS Coronavirus 2 NEGATIVE NEGATIVE    Comment: (NOTE) If result is NEGATIVE SARS-CoV-2 target nucleic acids are NOT DETECTED. The SARS-CoV-2 RNA is generally detectable in upper and lower  respiratory specimens during the acute phase of infection. The lowest  concentration of SARS-CoV-2 viral copies this assay can detect is 250  copies / mL. A negative result does not preclude SARS-CoV-2 infection  and should not be used as the sole basis for treatment or other  patient management decisions.  A negative result may occur with  improper specimen collection / handling, submission of specimen other  than nasopharyngeal swab, presence of viral mutation(s) within the  areas targeted by this assay, and inadequate number of viral copies  (<250 copies / mL). A negative result must be combined with clinical  observations, patient history, and epidemiological information. If result is POSITIVE SARS-CoV-2 target nucleic acids are DETECTED. The SARS-CoV-2 RNA is generally detectable in upper and lower  respiratory specimens dur ing the acute phase of infection.  Positive  results are indicative of active infection with SARS-CoV-2.  Clinical  correlation with patient history and other diagnostic information is  necessary to determine patient infection status.  Positive results do  not rule out bacterial infection or co-infection with other viruses. If result is PRESUMPTIVE POSTIVE SARS-CoV-2 nucleic acids MAY BE PRESENT.   A presumptive positive result was obtained on the submitted specimen  and confirmed on repeat testing.  While 2019 novel coronavirus  (SARS-CoV-2)  nucleic acids may be present in the submitted sample  additional confirmatory testing may be necessary for epidemiological  and / or clinical management purposes  to differentiate between  SARS-CoV-2 and other Sarbecovirus currently known to infect humans.  If clinically indicated additional testing with an alternate test  methodology [phone]) is advised. The SARS-CoV-2 RNA is generally  detectable in upper and lower respiratory sp ecimens during the acute  phase of infection. The expected result is Negative. Fact Sheet for Patients:  StrictlyIdeas.no Fact Sheet for Healthcare Providers: BankingDealers.co.za This test is not yet approved or cleared by the  [place] Architectural technologist and has been authorized for detection and/or diagnosis of SARS-CoV-2 by FDA under an Print production planner (EUA).  This EUA will remain in effect (meaning this test can be used) for the duration of the COVID-19 declaration under Section 564(b)(1) of the Act, 21 U.S.C. section 360bbb-3(b)(1), unless the authorization is terminated or revoked sooner. Performed at [facility], [address] [place], [place] [address]    Dg Chest 1v Repeat Same Day  Result Date: 03/18/2019 CLINICAL DATA:  Chest tube placement EXAM: CHEST - 1 VIEW SAME DAY COMPARISON:  03/18/2027 8:47 a.m. FINDINGS: There is been interval placement of a left-sided chest tube. The chest tube projects over the left upper lung zone. The previously noted left-sided pneumothorax is nearly entirely resolved. The heart size remains enlarged. The patient is status post prior median sternotomy. There is no significant pleural effusion. IMPRESSION: Status post placement of a left-sided chest tube with near complete resolution of the left-sided pneumothorax. Electronically Signed   By: Constance Holster M.D.   On: 03/18/2019 12:04   Dg Chest Port 1 View  Result Date: 03/18/2019 CLINICAL DATA:  Onset  left chest pain this morning after sneezing. EXAM: PORTABLE CHEST 1 VIEW COMPARISON:  Single-view of the chest and CT chest 11/22/2010. FINDINGS: The patient has a moderately large left pneumothorax estimated at 60-70%. No midline shift. The right lung is expanded and clear. Lungs are emphysematous. The patient is status post CABG. Heart size is upper normal. IMPRESSION: Left pneumothorax estimated at 60-70%. Emphysema. Critical Value/emergent results were called by telephone at the time of interpretation on 03/18/2019 at 9:16 am to providerMICHAEL BUTLER , who verbally acknowledged these results. Electronically Signed   By: Inge Rise M.D.   On: 03/18/2019 09:16    Pending Labs Unresulted Labs (From admission, onward)   None      Vitals/Pain Today's Vitals   03/18/19 1500 03/18/19 1515 03/18/19 1530 03/18/19 1536  BP: 96/67 112/67 91/72   Pulse: (!) 52 (!) 55 (!) 58   Resp: 20 20 13    Temp:      TempSrc:      SpO2: 100% 100% 100%   Weight:      Height:      PainSc:    5     Isolation Precautions No active isolations  Medications Medications  morphine 4 MG/ML injection 4 mg (4 mg Intravenous Given 03/18/19 1006)  morphine 4 MG/ML injection 4 mg (4 mg Intravenous Given 03/18/19 1115)  lidocaine (XYLOCAINE) 2 % (with pres) injection 400 mg (400 mg Intradermal Given 03/18/19 1100)  oxyCODONE-acetaminophen (PERCOCET/ROXICET) 5-325 MG per tablet 1 tablet (1 tablet Oral Given 03/18/19 1432)    Mobility walks with person assist Low fall risk   Focused Assessments Cardiac Assessment Handoff:  Cardiac Rhythm: Bundle branch block Lab Results  Component Value Date   CKTOTAL 38 11/23/2010   CKMB 0.6 11/23/2010   TROPONINI <0.30 01/18/2012   Lab Results  Component Value Date   DDIMER 4.28 (H) 07/28/2017      R Recommendations: See Admitting Provider Note  Report given to: Katharine Look  Additional Notes: Chest tube to left chest

## 2019-03-18 NOTE — ED Triage Notes (Signed)
Left sided chest pain, acute onset this morning after sneezing.  Pt states he is having some diff breathing. Denies travel.

## 2019-03-18 NOTE — ED Notes (Signed)
Pt tolerated procedure well.  Pt did admit to increase in chest pain post insertion.  Orders recd for morphine IVP.  Pt states he does feel like he can breathe much better.

## 2019-03-18 NOTE — H&P (Signed)
[address]       [place] [address]             [phone]      Cardiothoracic Surgery Admission History and Physical  Willie Black is an 68 y.o. male. 01/14/1951 [id number]   Chief Complaint: Spontaneous left pneumothorax HPI:   The patient is a 68 year old gentleman with history of coronary disease status post coronary bypass graft surgery in 2011 by me, previous smoking until that time, and left lower extremity DVT in February 2019 treated with Xarelto which he is still on reports that about 730 this morning he had a heavy sneeze and developed left-sided chest pain and shortness of breath afterwards.  He presented to the [facility] and a chest x-ray showed a large left pneumothorax.  A pigtail catheter was inserted by the emergency physician and the patient was transferred to my service at [facility].  He has no prior history of spontaneous pneumothorax.   Past Medical History:  Diagnosis Date  . Anxiety   . CAD (coronary artery disease)    a. s/p CABG in 2011 with LIMA-LAD, SVG-OM, and SVG-PDA b. low-risk NST in 08/2015  . GERD (gastroesophageal reflux disease)   . Heart attack ([place]) 2011  . Hypercholesteremia   . PVC (premature ventricular contraction)    Bigeminy PVC's on event monitor Nov 2012  . Renal calculi   . Shingles   . Trigeminal neuralgia of right side of face     Past Surgical History:  Procedure Laterality Date  . CERVICAL DISC SURGERY    . CORONARY ARTERY BYPASS GRAFT  05/28/2110   x3 with LIMA to LAD, SVG to OM and SVG to LAD after having ST elevation with coming off pump    Family History  Problem Relation Age of Onset  . Aneurysm Father        abdominal  . Coronary artery disease Father        3 stents  . Heart failure Mother    Social History:  reports that he quit smoking about 9 years ago. His smoking use included cigarettes. He has never used smokeless tobacco. He reports that he does not drink  alcohol or use drugs.  Allergies:  Allergies  Allergen Reactions  . Penicillins Rash    Ulcers in the mouth    Medications Prior to Admission  Medication Sig Dispense Refill  . Calcium Carbonate-Vitamin D (CALCIUM + D PO) Take by mouth daily.      Marland Kitchen escitalopram (LEXAPRO) 20 MG tablet Take 20 mg by mouth daily.  1  . metoprolol tartrate (LOPRESSOR) 25 MG tablet TAKE 1 TABLET BY MOUTH THREE TIMES A DAY (Patient taking differently: Take 25 mg by mouth 2 (two) times daily. ) 270 tablet 3  . OXcarbazepine (TRILEPTAL) 150 MG tablet Take 450 mg by mouth 3 (three) times daily.    . rosuvastatin (CRESTOR) 20 MG tablet TAKE 1 TABLET BY MOUTH EVERY DAY 90 tablet 1  . XARELTO 10 MG TABS tablet TAKE 1 TABLET BY MOUTH EVERY DAY 90 tablet 1    Results for orders placed or performed during the hospital encounter of 03/18/19 (from the past 48 hour(s))  Basic metabolic panel     Status: Abnormal   Collection Time: 03/18/19  8:58 AM  Result Value Ref Range   Sodium 138 135 - 145 mmol/L   Potassium 4.6 3.5 - 5.1 mmol/L   Chloride 101 98 -  111 mmol/L   CO2 28 22 - 32 mmol/L   Glucose, Bld 104 (H) 70 - 99 mg/dL   BUN 18 8 - 23 mg/dL   Creatinine, Ser 1.03 0.61 - 1.24 mg/dL   Calcium 9.1 8.9 - 10.3 mg/dL   GFR calc non Af Amer >60 >60 mL/min   GFR calc Af Amer >60 >60 mL/min   Anion gap 9 5 - 15    Comment: Performed at [facility], [address] [place], [place] [address]  CBC     Status: Abnormal   Collection Time: 03/18/19  8:58 AM  Result Value Ref Range   WBC 3.9 (L) 4.0 - 10.5 K/uL   RBC 4.52 4.22 - 5.81 MIL/uL   Hemoglobin 13.8 13.0 - 17.0 g/dL   HCT 44.0 39.0 - 52.0 %   MCV 97.3 80.0 - 100.0 fL   MCH 30.5 26.0 - 34.0 pg   MCHC 31.4 30.0 - 36.0 g/dL   RDW 13.7 11.5 - 15.5 %   Platelets 197 150 - 400 K/uL   nRBC 0.0 0.0 - 0.2 %    Comment: Performed at [facility], [address] [place], [place] [address]  Troponin I (High Sensitivity)     Status: None    Collection Time: 03/18/19  8:58 AM  Result Value Ref Range   Troponin I (High Sensitivity) 3 <18 ng/L    Comment: (NOTE) Elevated high sensitivity troponin I (hsTnI) values and significant  changes across serial measurements may suggest ACS but many other  chronic and acute conditions are known to elevate hsTnI results.  Refer to the "Links" section for chest pain algorithms and additional  guidance. Performed at [facility], [address] [place], [place] [address]   SARS Coronavirus 2 [facility] order, Performed in [facility] hospital lab) Nasopharyngeal Nasopharyngeal Swab     Status: None   Collection Time: 03/18/19  9:03 AM   Specimen: Nasopharyngeal Swab  Result Value Ref Range   SARS Coronavirus 2 NEGATIVE NEGATIVE    Comment: (NOTE) If result is NEGATIVE SARS-CoV-2 target nucleic acids are NOT DETECTED. The SARS-CoV-2 RNA is generally detectable in upper and lower  respiratory specimens during the acute phase of infection. The lowest  concentration of SARS-CoV-2 viral copies this assay can detect is 250  copies / mL. A negative result does not preclude SARS-CoV-2 infection  and should not be used as the sole basis for treatment or other  patient management decisions.  A negative result may occur with  improper specimen collection / handling, submission of specimen other  than nasopharyngeal swab, presence of viral mutation(s) within the  areas targeted by this assay, and inadequate number of viral copies  (<250 copies / mL). A negative result must be combined with clinical  observations, patient history, and epidemiological information. If result is POSITIVE SARS-CoV-2 target nucleic acids are DETECTED. The SARS-CoV-2 RNA is generally detectable in upper and lower  respiratory specimens dur ing the acute phase of infection.  Positive  results are indicative of active infection with SARS-CoV-2.  Clinical  correlation with patient history and other diagnostic  information is  necessary to determine patient infection status.  Positive results do  not rule out bacterial infection or co-infection with other viruses. If result is PRESUMPTIVE POSTIVE SARS-CoV-2 nucleic acids MAY BE PRESENT.   A presumptive positive result was obtained on the submitted specimen  and confirmed on repeat testing.  While  2019 novel coronavirus  (SARS-CoV-2) nucleic acids may be present in the submitted sample  additional confirmatory testing may be necessary for epidemiological  and / or clinical management purposes  to differentiate between  SARS-CoV-2 and other Sarbecovirus currently known to infect humans.  If clinically indicated additional testing with an alternate test  methodology [phone]) is advised. The SARS-CoV-2 RNA is generally  detectable in upper and lower respiratory sp ecimens during the acute  phase of infection. The expected result is Negative. Fact Sheet for Patients:  StrictlyIdeas.no Fact Sheet for Healthcare Providers: BankingDealers.co.za This test is not yet approved or cleared by the [place] FDA and has been authorized for detection and/or diagnosis of SARS-CoV-2 by FDA under an Emergency Use Authorization (EUA).  This EUA will remain in effect (meaning this test can be used) for the duration of the COVID-19 declaration under Section 564(b)(1) of the Act, 21 U.S.C. section 360bbb-3(b)(1), unless the authorization is terminated or revoked sooner. Performed at [facility], [address], [place], [place] [address]    Dg Chest 1v Repeat Same Day  Result Date: 03/18/2019 CLINICAL DATA:  Chest tube placement EXAM: CHEST - 1 VIEW SAME DAY COMPARISON:  03/18/2027 8:47 a.m. FINDINGS: There is been interval placement of a left-sided chest tube. The chest tube projects over the left upper lung zone. The previously noted left-sided pneumothorax is nearly entirely resolved. The heart  size remains enlarged. The patient is status post prior median sternotomy. There is no significant pleural effusion. IMPRESSION: Status post placement of a left-sided chest tube with near complete resolution of the left-sided pneumothorax. Electronically Signed   By: Constance Holster M.D.   On: 03/18/2019 12:04   Dg Chest Port 1 View  Result Date: 03/18/2019 CLINICAL DATA:  Onset left chest pain this morning after sneezing. EXAM: PORTABLE CHEST 1 VIEW COMPARISON:  Single-view of the chest and CT chest 11/22/2010. FINDINGS: The patient has a moderately large left pneumothorax estimated at 60-70%. No midline shift. The right lung is expanded and clear. Lungs are emphysematous. The patient is status post CABG. Heart size is upper normal. IMPRESSION: Left pneumothorax estimated at 60-70%. Emphysema. Critical Value/emergent results were called by telephone at the time of interpretation on 03/18/2019 at 9:16 am to providerMICHAEL BUTLER , who verbally acknowledged these results. Electronically Signed   By: Inge Rise M.D.   On: 03/18/2019 09:16    Review of Systems  Constitutional: Negative.   HENT: Negative.   Eyes: Negative.   Respiratory: Positive for shortness of breath.   Cardiovascular: Positive for chest pain.  Gastrointestinal: Negative.   Genitourinary: Negative.   Musculoskeletal: Positive for back pain.  Skin: Negative.   Neurological: Negative.   Endo/Heme/Allergies: Negative.   Psychiatric/Behavioral: Negative.     Blood pressure 117/66, pulse (!) 55, temperature 98 F (36.7 C), temperature source Oral, resp. rate (!) 22, height 6\' 2"  (1.88 m), weight 113.4 kg, SpO2 95 %. Physical Exam  Constitutional: He is oriented to person, place, and time. He appears well-developed and well-nourished. No distress.  HENT:  Head: Normocephalic and atraumatic.  Eyes: Pupils are equal, round, and reactive to light. EOM are normal.  Neck: Normal range of motion. Neck supple. No JVD present.   Cardiovascular: Normal rate, regular rhythm and normal heart sounds.  No murmur heard. Respiratory: Effort normal and breath sounds normal. No respiratory distress.  Pigtail catheter left chest with 1-4+ intermittent but frequent air leak.  Musculoskeletal:  General: No edema.  Lymphadenopathy:    He has no cervical adenopathy.  Neurological: He is alert and oriented to person, place, and time.  Skin: Skin is warm and dry.  Psychiatric: He has a normal mood and affect.    CLINICAL DATA:  Chest tube placement  EXAM: CHEST - 1 VIEW SAME DAY  COMPARISON:  03/18/2027 8:47 a.m.  FINDINGS: There is been interval placement of a left-sided chest tube. The chest tube projects over the left upper lung zone. The previously noted left-sided pneumothorax is nearly entirely resolved. The heart size remains enlarged. The patient is status post prior median sternotomy. There is no significant pleural effusion.  IMPRESSION: Status post placement of a left-sided chest tube with near complete resolution of the left-sided pneumothorax.   Electronically Signed   By: Constance Holster M.D.   On: 03/18/2019 12:04   Assessment/Plan  This 68 year old previous smoker has a first episode of spontaneous left pneumothorax.  This is been successfully treated with a pigtail catheter.  He does have a persistent air leak.  He has been admitted for chest tube management and we will keep the catheter to 20 cm of suction and repeat his chest x-ray in the morning.  I discussed the pathology of spontaneous pneumothorax as well as the treatment with the patient and his wife.  We discussed the possibility of needing a left VATS if the air leak does not stop on its own.  All of his questions have been answered.  He did have a CT angiogram of the chest in 2012 which I have reviewed and showed significant bullous disease of the upper lobes in both lungs particularly at the apex and medially in the upper  lobes.  I suspect this is most likely the cause of his spontaneous pneumothorax.  Willie Black 03/18/2019, 8:17 PM

## 2019-03-18 NOTE — ED Notes (Signed)
Dr. Melina Copa informed of continued pain, consulting surgeon on placement

## 2019-03-19 ENCOUNTER — Inpatient Hospital Stay (HOSPITAL_COMMUNITY): Payer: Medicare Other

## 2019-03-19 ENCOUNTER — Encounter (HOSPITAL_COMMUNITY): Payer: Self-pay | Admitting: General Practice

## 2019-03-19 NOTE — Progress Notes (Addendum)
      [address]       [place] [address]             [phone]           Subjective: Patient sitting in chair. He has some pain at pigtail chest tube site.  Objective: Vital signs in last 24 hours: Temp:  [97.6 F (36.4 C)-98.6 F (37 C)] 97.6 F (36.4 C) (09/25 0205) Pulse Rate:  [50-60] 55 (09/25 0205) Cardiac Rhythm: Sinus bradycardia (09/25 0700) Resp:  [10-22] 15 (09/25 0205) BP: (91-181)/(56-107) 126/76 (09/25 0205) SpO2:  [95 %-100 %] 95 % (09/25 0205) Weight:  [113.4 kg] 113.4 kg (09/24 0841)    Intake/Output from previous day: 09/24 0701 - 09/25 0700 In: 1080 [P.O.:1080] Out: 0    Physical Exam:  Cardiovascular: RRR Pulmonary: Clear to auscultation bilaterally. Abdomen: Soft, non tender, bowel sounds present. Wounds: Clean and dry.  No erythema or signs of infection. Chest Tube: to suction;small, intermittent air leak  Lab Results: CBC: Recent Labs    03/18/19 0858  WBC 3.9*  HGB 13.8  HCT 44.0  PLT 197   BMET:  Recent Labs    03/18/19 0858  NA 138  K 4.6  CL 101  CO2 28  GLUCOSE 104*  BUN 18  CREATININE 1.03  CALCIUM 9.1    PT/INR: No results for input(s): LABPROT, INR in the last 72 hours. ABG:  INR: Will add last result for INR, ABG once components are confirmed Will add last 4 CBG results once components are confirmed  Assessment/Plan:  1. CV - SB at times, otherwise, SR. 2.  Pulmonary - Chest tube to suction;very intermittent +1 air leak. CXR this am appears stable. Hope to place pigtail chest tube to water seal soon. Encourage incentive spirometer.  3. Await this am's creatinine before giving a few doses of Toradol to help with pain   Donielle M ZimmermanPA-C 03/19/2019,7:57 AM [phone]   Chart reviewed, patient examined, agree with above. CXR looks ok. May be tiny left apical ptx or it may be blebs seen on prior CT. Chest tube has intermittent air leak that is less than yesterday but still 1+  to 4+ but it overall it is less. Continue tube to suction for now. If he continues to leak over the weekend would repeat chest CT on Sunday since last study was in 2012.

## 2019-03-20 ENCOUNTER — Inpatient Hospital Stay (HOSPITAL_COMMUNITY): Payer: Medicare Other

## 2019-03-20 NOTE — Plan of Care (Signed)
  Problem: Education: Goal: Knowledge of General Education information will improve Description Including pain rating scale, medication(s)/side effects and non-pharmacologic comfort measures Outcome: Progressing   Problem: Health Behavior/Discharge Planning: Goal: Ability to manage health-related needs will improve Outcome: Progressing   

## 2019-03-20 NOTE — Plan of Care (Signed)
  Problem: Education: Goal: Knowledge of General Education information will improve Description: Including pain rating scale, medication(s)/side effects and non-pharmacologic comfort measures 03/20/2019 2125 by Drenda Freeze, RN Outcome: Progressing 03/20/2019 2125 by Drenda Freeze, RN Outcome: Progressing   Problem: Health Behavior/Discharge Planning: Goal: Ability to manage health-related needs will improve 03/20/2019 2125 by Drenda Freeze, RN Outcome: Progressing 03/20/2019 2125 by Drenda Freeze, RN Outcome: Progressing

## 2019-03-20 NOTE — Progress Notes (Addendum)
[address]       [place] [address]             [phone]         Subjective: Feels ok, no new c/o  Objective: Vital signs in last 24 hours: Temp:  [98.4 F (36.9 C)-98.6 F (37 C)] 98.4 F (36.9 C) (09/26 0500) Pulse Rate:  [58-62] 59 (09/26 0500) Cardiac Rhythm: Normal sinus rhythm (09/26 0700) Resp:  [14-23] 14 (09/26 0500) BP: (108-151)/(56-74) 134/74 (09/26 0500) SpO2:  [96 %-99 %] 99 % (09/26 0500)  Hemodynamic parameters for last 24 hours:    Intake/Output from previous day: 09/25 0701 - 09/26 0700 In: 175 [P.O.:175] Out: 20 [Chest Tube:20] Intake/Output this shift: No intake/output data recorded.  General appearance: alert, cooperative and no distress Heart: regular rate and rhythm Lungs: mildly dim on left Abdomen: benign Extremities: no edema or calf tendernes Wound: CT site is fine  Lab Results: Recent Labs    03/18/19 0858  WBC 3.9*  HGB 13.8  HCT 44.0  PLT 197   BMET:  Recent Labs    03/18/19 0858  NA 138  K 4.6  CL 101  CO2 28  GLUCOSE 104*  BUN 18  CREATININE 1.03  CALCIUM 9.1    PT/INR: No results for input(s): LABPROT, INR in the last 72 hours. ABG    Component Value Date/Time   PHART 7.347 (L) 05/28/2010 1952   HCO3 24.1 (H) 05/28/2010 1952   TCO2 23 05/29/2010 1732   ACIDBASEDEF 1.0 05/28/2010 1952   O2SAT 89.0 05/28/2010 1952   CBG (last 3)  No results for input(s): GLUCAP in the last 72 hours.  Meds Scheduled Meds: . enoxaparin (LOVENOX) injection  40 mg Subcutaneous Q24H  . escitalopram  20 mg Oral Daily  . metoprolol tartrate  25 mg Oral TID  . OXcarbazepine  450 mg Oral TID  . rosuvastatin  20 mg Oral Daily   Continuous Infusions: PRN Meds:.acetaminophen, oxyCODONE  Xrays Dg Chest 1v Repeat Same Day  Result Date: 03/18/2019 CLINICAL DATA:  Chest tube placement EXAM: CHEST - 1 VIEW SAME DAY COMPARISON:  03/18/2027 8:47 a.m. FINDINGS: There is been interval placement of a  left-sided chest tube. The chest tube projects over the left upper lung zone. The previously noted left-sided pneumothorax is nearly entirely resolved. The heart size remains enlarged. The patient is status post prior median sternotomy. There is no significant pleural effusion. IMPRESSION: Status post placement of a left-sided chest tube with near complete resolution of the left-sided pneumothorax. Electronically Signed   By: Constance Holster M.D.   On: 03/18/2019 12:04   Dg Chest Port 1 View  Result Date: 03/20/2019 CLINICAL DATA:  Pneumothorax. EXAM: PORTABLE CHEST 1 VIEW COMPARISON:  Radiograph of March 19, 2019. FINDINGS: The heart size and mediastinal contours are within normal limits. Stable position of left-sided chest tube. Minimal left apical and basilar pneumothorax is noted. Right lung is clear. The visualized skeletal structures are unremarkable. IMPRESSION: Stable position of left-sided chest tube with minimal left apical and basilar pneumothorax. Electronically Signed   By: Marijo Conception M.D.   On: 03/20/2019 09:18   Dg Chest Port 1 View  Result Date: 03/19/2019 CLINICAL DATA:  Chest 2, coronary artery disease post MI and CABG, GERD EXAM: PORTABLE CHEST 1 VIEW COMPARISON:  Portable exam 0621 hours compared to 03/18/2019 FINDINGS: Pigtail LEFT thoracostomy tube again seen. Normal heart size post CABG. Mediastinal contours and  pulmonary vascularity normal. Lungs clear. No pleural effusion or acute infiltrate seen. Small LEFT apex pneumothorax identified. Bones demineralized. Prior cervicothoracic fusion. IMPRESSION: Small LEFT apex pneumothorax despite thoracostomy tube. Electronically Signed   By: Lavonia Dana M.D.   On: 03/19/2019 09:12    Assessment/Plan:  1 stable with 1+ intermit air leak, cont suction as is, CXR is stable. Poss CT scan tomorrow.      LOS: 2 days    John Giovanni [facility] 03/20/2019 Pager [phone]  Agree with above Small air leak Low threshold for CT  tomorrow Lajuana Matte

## 2019-03-21 ENCOUNTER — Inpatient Hospital Stay (HOSPITAL_COMMUNITY): Payer: Medicare Other

## 2019-03-21 MED ORDER — ZOLPIDEM TARTRATE 5 MG PO TABS
5.0000 mg | ORAL_TABLET | Freq: Every evening | ORAL | Status: DC | PRN
  Administered 2019-03-21: 5 mg via ORAL
  Filled 2019-03-21: qty 1

## 2019-03-21 NOTE — Progress Notes (Addendum)
      [address]       [place] [address]             [phone]         Subjective: Small intermit air leak  Objective: Vital signs in last 24 hours: Temp:  [98.6 F (37 C)-99.9 F (37.7 C)] 99.9 F (37.7 C) (09/27 0500) Pulse Rate:  [60-74] 60 (09/27 0500) Cardiac Rhythm: Normal sinus rhythm;Sinus bradycardia (09/26 2000) Resp:  [16-25] 19 (09/27 0500) BP: (106-125)/(55-88) 119/88 (09/27 0500) SpO2:  [94 %-100 %] 100 % (09/27 0500)  Hemodynamic parameters for last 24 hours:    Intake/Output from previous day: 09/26 0701 - 09/27 0700 In: 140 [P.O.:140] Out: 88 [Chest Tube:88] Intake/Output this shift: No intake/output data recorded.  General appearance: alert, cooperative and no distress Heart: regular rate and rhythm Lungs: fair air exchange Abdomen: benign Extremities: no edema or calf tenderness Wound: CT site ok  Lab Results: Recent Labs    03/18/19 0858  WBC 3.9*  HGB 13.8  HCT 44.0  PLT 197   BMET:  Recent Labs    03/18/19 0858  NA 138  K 4.6  CL 101  CO2 28  GLUCOSE 104*  BUN 18  CREATININE 1.03  CALCIUM 9.1    PT/INR: No results for input(s): LABPROT, INR in the last 72 hours. ABG    Component Value Date/Time   PHART 7.347 (L) 05/28/2010 1952   HCO3 24.1 (H) 05/28/2010 1952   TCO2 23 05/29/2010 1732   ACIDBASEDEF 1.0 05/28/2010 1952   O2SAT 89.0 05/28/2010 1952   CBG (last 3)  No results for input(s): GLUCAP in the last 72 hours.  Meds Scheduled Meds: . enoxaparin (LOVENOX) injection  40 mg Subcutaneous Q24H  . escitalopram  20 mg Oral Daily  . metoprolol tartrate  25 mg Oral TID  . OXcarbazepine  450 mg Oral TID  . rosuvastatin  20 mg Oral Daily   Continuous Infusions: PRN Meds:.acetaminophen, oxyCODONE  Xrays Dg Chest Port 1 View  Result Date: 03/20/2019 CLINICAL DATA:  Pneumothorax. EXAM: PORTABLE CHEST 1 VIEW COMPARISON:  Radiograph of March 19, 2019. FINDINGS: The heart size and mediastinal  contours are within normal limits. Stable position of left-sided chest tube. Minimal left apical and basilar pneumothorax is noted. Right lung is clear. The visualized skeletal structures are unremarkable. IMPRESSION: Stable position of left-sided chest tube with minimal left apical and basilar pneumothorax. Electronically Signed   By: Marijo Conception M.D.   On: 03/20/2019 09:18    Assessment/Plan:  1 conts with small , intermitt air leak, will obtain chest CT today to better delineate the anatomy 2 clinically quite stable     LOS: 3 days    John Giovanni [facility] 03/21/2019 Pager [phone]  Agree with above Persistent leak CT shows upper lobe blebs that are mostly medial Will discuss options with Dr. Cyndia Bent Continue CT management for now Lajuana Matte

## 2019-03-22 ENCOUNTER — Inpatient Hospital Stay (HOSPITAL_COMMUNITY): Payer: Medicare Other

## 2019-03-22 NOTE — Care Management Important Message (Signed)
Important Message  Patient Details  Name: Willie Black MRN: [id number] Date of Birth: 07/10/1950   Medicare Important Message Given:  Yes     Orbie Pyo 03/22/2019, 2:36 PM

## 2019-03-22 NOTE — Progress Notes (Addendum)
      [address]       [place] [address]             [phone]         Subjective: No issues over night. He did have a fever of 102 around 7pm but this resolved. Will watch closely.   Objective: Vital signs in last 24 hours: Temp:  [99.4 F (37.4 C)-102.1 F (38.9 C)] 99.7 F (37.6 C) (09/28 0352) Pulse Rate:  [64-73] 64 (09/28 0352) Cardiac Rhythm: Normal sinus rhythm;Sinus bradycardia (09/27 2000) Resp:  [14-20] 14 (09/28 0352) BP: (115-145)/(60-81) 140/61 (09/28 0352) SpO2:  [94 %-96 %] 96 % (09/28 0352)     Intake/Output from previous day: 09/27 0701 - 09/28 0700 In: 340 [P.O.:340] Out: 62 [Chest Tube:62] Intake/Output this shift: No intake/output data recorded.  General appearance: alert, cooperative and no distress Heart: regular rate and rhythm, S1, S2 normal, no murmur, click, rub or gallop Lungs: clear to auscultation bilaterally and diminished in the left lower lobe Abdomen: soft, non-tender; bowel sounds normal; no masses,  no organomegaly Extremities: extremities normal, atraumatic, no cyanosis or edema Wound: clean and dry around chest tube site.  Lab Results: No results for input(s): WBC, HGB, HCT, PLT in the last 72 hours. BMET: No results for input(s): NA, K, CL, CO2, GLUCOSE, BUN, CREATININE, CALCIUM in the last 72 hours.  PT/INR: No results for input(s): LABPROT, INR in the last 72 hours. ABG    Component Value Date/Time   PHART 7.347 (L) 05/28/2010 1952   HCO3 24.1 (H) 05/28/2010 1952   TCO2 23 05/29/2010 1732   ACIDBASEDEF 1.0 05/28/2010 1952   O2SAT 89.0 05/28/2010 1952   CBG (last 3)  No results for input(s): GLUCAP in the last 72 hours.  Assessment/Plan:  1. CT scan showed a 5% pneumothorax. Bullous lung disease in the upper lungs along the medial margins. Multiple benign subpleural pulmonary nodules.  2. Air leak with cough but no air leak at rest. No CXR this morning so will order. Evaluated on water seal since  he has not hooked it back up since using the bathroom.   Plan: Will reviewed CXR this morning. Will likely need to remain on suction. He is tolerating room air. He is using his incentive spirometer.     LOS: 4 days    Elgie Collard 03/22/2019   Chart reviewed, patient examined, agree with above. He still has intermittent air leak. Lung dropped down on chest CT off suction so I would keep on suction for now. Chest CT reviewed and there are multiple blebs at apex and medially along the mediastinum in the area of the LIMA graft. This would make bleb resection more difficulty because I suspect the lung will be partially adherent in this area. He is only 52 so I think VATS is probably indicated if this leak continues to allow attempt at bleb resection and mechanical pleurodesis. Discussed with patient.

## 2019-03-22 NOTE — Progress Notes (Signed)
Patient ID: Willie Black, male   DOB: [birth year], 68 y.o.   MRN: [id number] TCTS  Feels ok. No further fever.  Has been ambulating. Air leak is the same. Intermittent 1-3+ Wife is here with him and I answered their questions. Plan VATS for bleb resection and mechanical pleurodesis on Wed if the air leak does not stop.

## 2019-03-22 NOTE — Progress Notes (Signed)
Pt bp lower than baseline 98/65 HR 65. Changed cuff, took vitals multiple times. Held metoprolol. Paged PA to inform of change. Pt states he became hot and clammy but symptoms resolved. No changes in chest tube level, still small leak. Will continue to monitor and await orders.  Jerald Kief, RN

## 2019-03-23 ENCOUNTER — Inpatient Hospital Stay (HOSPITAL_COMMUNITY): Payer: Medicare Other

## 2019-03-23 DIAGNOSIS — J9311 Primary spontaneous pneumothorax: Secondary | ICD-10-CM

## 2019-03-23 MED ORDER — MUPIROCIN 2 % EX OINT
1.0000 "application " | TOPICAL_OINTMENT | Freq: Two times a day (BID) | CUTANEOUS | Status: DC
  Administered 2019-03-23 – 2019-03-27 (×7): 1 via NASAL
  Filled 2019-03-23 (×2): qty 22

## 2019-03-23 MED ORDER — LEVOFLOXACIN IN D5W 750 MG/150ML IV SOLN
750.0000 mg | INTRAVENOUS | Status: AC
  Administered 2019-03-24: 750 mg via INTRAVENOUS
  Filled 2019-03-23 (×2): qty 150

## 2019-03-23 MED ORDER — PANTOPRAZOLE SODIUM 40 MG PO TBEC
40.0000 mg | DELAYED_RELEASE_TABLET | Freq: Every day | ORAL | Status: DC
  Administered 2019-03-23: 40 mg via ORAL
  Filled 2019-03-23: qty 1

## 2019-03-23 NOTE — Progress Notes (Addendum)
      [address]       [place] [address]             [phone]        Procedure(s) (LRB): VIDEO ASSISTED THORACOSCOPY (Left) STAPLING OF BLEBS (Left) possible THORACOTOMY MAJOR (Left) Subjective: Feels okay and does not have any shortness of breath.  Objective: Vital signs in last 24 hours: Temp:  [98.3 F (36.8 C)-99.4 F (37.4 C)] 98.7 F (37.1 C) (09/29 0527) Pulse Rate:  [54-70] 54 (09/29 0527) Cardiac Rhythm: Normal sinus rhythm (09/28 1958) Resp:  [13-20] 16 (09/29 0747) BP: (97-146)/(61-79) 112/68 (09/29 0527) SpO2:  [93 %-97 %] 95 % (09/29 0527)     Intake/Output from previous day: 09/28 0701 - 09/29 0700 In: 720 [P.O.:720] Out: 30 [Chest Tube:30] Intake/Output this shift: No intake/output data recorded.  General appearance: alert, cooperative and no distress Heart: regular rate and rhythm, S1, S2 normal, no murmur, click, rub or gallop Lungs: clear to auscultation bilaterally Abdomen: soft, non-tender; bowel sounds normal; no masses,  no organomegaly Extremities: extremities normal, atraumatic, no cyanosis or edema Wound: clean and dry around the chest tube  Lab Results: No results for input(s): WBC, HGB, HCT, PLT in the last 72 hours. BMET: No results for input(s): NA, K, CL, CO2, GLUCOSE, BUN, CREATININE, CALCIUM in the last 72 hours.  PT/INR: No results for input(s): LABPROT, INR in the last 72 hours. ABG    Component Value Date/Time   PHART 7.347 (L) 05/28/2010 1952   HCO3 24.1 (H) 05/28/2010 1952   TCO2 23 05/29/2010 1732   ACIDBASEDEF 1.0 05/28/2010 1952   O2SAT 89.0 05/28/2010 1952   CBG (last 3)  No results for input(s): GLUCAP in the last 72 hours.  Assessment/Plan: S/P Procedure(s) (LRB): VIDEO ASSISTED THORACOSCOPY (Left) STAPLING OF BLEBS (Left) possible THORACOTOMY MAJOR (Left)  1. CXR stable this morning. I don't see a pneumo but will await radiology results.  2. Chest tube with 1+ intermittent leak with  cough and at rest.  3. No recent fevers. BP improved. Holding metoprolol.   Plan: continued air leak from the chest tube. Dr. Cyndia Bent discussed VATs with bleb resection and mechanical pleurodesis with the patient yesterday. He is agreeable. Plan for tomorrow if no change in air leak.      LOS: 5 days    Elgie Collard 03/23/2019   Chart reviewed, patient examined, agree with above. He still has intermittent but persistent air leak that has not changed much since admission.  Since he has multiple significant blebs in the left apex and along the medial aspect of the left upper lobe I think it would be best to proceed with VATS for bleb stapling and mechanical pleurodesis.  I discussed the operative procedure with the patient and his wife including the alternative of continued chest tube drainage or talc pleurodesis through the chest tube.  I discussed the benefits and risk of surgery including but not limited to bleeding, infection, persistent air leak, recurrent pneumothorax, and he understands would like to proceed with surgery.  We will plan to do this in the morning.

## 2019-03-24 ENCOUNTER — Inpatient Hospital Stay (HOSPITAL_COMMUNITY): Payer: Medicare Other | Admitting: Certified Registered Nurse Anesthetist

## 2019-03-24 ENCOUNTER — Inpatient Hospital Stay (HOSPITAL_COMMUNITY): Payer: Medicare Other

## 2019-03-24 ENCOUNTER — Encounter (HOSPITAL_COMMUNITY)
Admission: EM | Disposition: A | Discharge status: Home / Self Care | Payer: Self-pay | Source: Home / Self Care | Attending: Surgery

## 2019-03-24 DIAGNOSIS — J9311 Primary spontaneous pneumothorax: Secondary | ICD-10-CM

## 2019-03-24 DIAGNOSIS — Z9889 Other specified postprocedural states: Secondary | ICD-10-CM

## 2019-03-24 HISTORY — PX: VIDEO ASSISTED THORACOSCOPY: SHX5073

## 2019-03-24 HISTORY — PX: STAPLING OF BLEBS: SHX6429

## 2019-03-24 LAB — GLUCOSE, CAPILLARY
Glucose-Capillary: 150 mg/dL — ABNORMAL HIGH (ref 70–99)
Glucose-Capillary: 156 mg/dL — ABNORMAL HIGH (ref 70–99)

## 2019-03-24 LAB — SURGICAL PCR SCREEN
MRSA, PCR: NEGATIVE
Staphylococcus aureus: POSITIVE — AB

## 2019-03-24 SURGERY — VIDEO ASSISTED THORACOSCOPY
Anesthesia: General | Site: Chest | Laterality: Left

## 2019-03-24 MED ORDER — OXYCODONE HCL 5 MG PO TABS: 5.0000 mg | ORAL_TABLET | ORAL | Status: DC | PRN

## 2019-03-24 MED ORDER — ACETAMINOPHEN 160 MG/5ML PO SOLN: 1000.0000 mg | Freq: Four times a day (QID) | ORAL | Status: DC

## 2019-03-24 MED ORDER — OXYCODONE HCL 5 MG/5ML PO SOLN: 5.0000 mg | Freq: Once | ORAL | Status: DC | PRN

## 2019-03-24 MED ORDER — MIDAZOLAM HCL 5 MG/5ML IJ SOLN
INTRAMUSCULAR | Status: DC | PRN
  Administered 2019-03-24: 2 mg via INTRAVENOUS

## 2019-03-24 MED ORDER — METOCLOPRAMIDE HCL 5 MG/ML IJ SOLN
10.0000 mg | Freq: Four times a day (QID) | INTRAMUSCULAR | Status: AC
  Administered 2019-03-24 – 2019-03-25 (×4): 10 mg via INTRAVENOUS
  Filled 2019-03-24 (×4): qty 2

## 2019-03-24 MED ORDER — SODIUM CHLORIDE 0.9 % IV SOLN
INTRAVENOUS | Status: DC | PRN
  Administered 2019-03-24: 50 ug/min via INTRAVENOUS

## 2019-03-24 MED ORDER — FENTANYL CITRATE (PF) 100 MCG/2ML IJ SOLN: 25.0000 ug | INTRAMUSCULAR | Status: DC | PRN

## 2019-03-24 MED ORDER — BUPIVACAINE HCL (PF) 0.5 % IJ SOLN
INTRAMUSCULAR | Status: AC
  Filled 2019-03-24: qty 30

## 2019-03-24 MED ORDER — DEXAMETHASONE SODIUM PHOSPHATE 10 MG/ML IJ SOLN
INTRAMUSCULAR | Status: DC | PRN
  Administered 2019-03-24: 10 mg via INTRAVENOUS

## 2019-03-24 MED ORDER — ACETAMINOPHEN 500 MG PO TABS: 1000.0000 mg | ORAL_TABLET | Freq: Once | ORAL | Status: DC | PRN

## 2019-03-24 MED ORDER — BUPIVACAINE HCL (PF) 0.5 % IJ SOLN: INTRAMUSCULAR | Status: DC | PRN

## 2019-03-24 MED ORDER — LACTATED RINGERS IV SOLN
INTRAVENOUS | Status: DC | PRN
  Administered 2019-03-24 (×2): via INTRAVENOUS

## 2019-03-24 MED ORDER — PROPOFOL 10 MG/ML IV BOLUS
INTRAVENOUS | Status: AC
  Filled 2019-03-24: qty 40

## 2019-03-24 MED ORDER — ACETAMINOPHEN 500 MG PO TABS
1000.0000 mg | ORAL_TABLET | Freq: Four times a day (QID) | ORAL | Status: DC
  Administered 2019-03-24 – 2019-03-27 (×10): 1000 mg via ORAL
  Filled 2019-03-24 (×11): qty 2

## 2019-03-24 MED ORDER — DIPHENHYDRAMINE HCL 50 MG/ML IJ SOLN: 12.5000 mg | Freq: Four times a day (QID) | INTRAMUSCULAR | Status: DC | PRN

## 2019-03-24 MED ORDER — FENTANYL CITRATE (PF) 250 MCG/5ML IJ SOLN
INTRAMUSCULAR | Status: AC
  Filled 2019-03-24: qty 5

## 2019-03-24 MED ORDER — ROCURONIUM BROMIDE 50 MG/5ML IV SOSY
PREFILLED_SYRINGE | INTRAVENOUS | Status: DC | PRN
  Administered 2019-03-24: 10 mg via INTRAVENOUS
  Administered 2019-03-24: 100 mg via INTRAVENOUS
  Administered 2019-03-24 (×2): 10 mg via INTRAVENOUS

## 2019-03-24 MED ORDER — KETOROLAC TROMETHAMINE 15 MG/ML IJ SOLN
15.0000 mg | Freq: Four times a day (QID) | INTRAMUSCULAR | Status: DC | PRN
  Administered 2019-03-25 – 2019-03-26 (×4): 15 mg via INTRAVENOUS
  Filled 2019-03-24 (×4): qty 1

## 2019-03-24 MED ORDER — ONDANSETRON HCL 4 MG/2ML IJ SOLN
4.0000 mg | Freq: Four times a day (QID) | INTRAMUSCULAR | Status: DC | PRN
  Administered 2019-03-26: 19:00:00 4 mg via INTRAVENOUS
  Filled 2019-03-24: qty 2

## 2019-03-24 MED ORDER — DEXTROSE-NACL 5-0.9 % IV SOLN
INTRAVENOUS | Status: DC
  Administered 2019-03-24 – 2019-03-25 (×2): via INTRAVENOUS

## 2019-03-24 MED ORDER — SENNOSIDES-DOCUSATE SODIUM 8.6-50 MG PO TABS
1.0000 | ORAL_TABLET | Freq: Every day | ORAL | Status: DC
  Administered 2019-03-24 – 2019-03-25 (×2): 1 via ORAL
  Filled 2019-03-24 (×3): qty 1

## 2019-03-24 MED ORDER — FENTANYL CITRATE (PF) 100 MCG/2ML IJ SOLN
INTRAMUSCULAR | Status: DC | PRN
  Administered 2019-03-24: 150 ug via INTRAVENOUS
  Administered 2019-03-24: 100 ug via INTRAVENOUS
  Administered 2019-03-24: 50 ug via INTRAVENOUS

## 2019-03-24 MED ORDER — TRAMADOL HCL 50 MG PO TABS: 50.0000 mg | ORAL_TABLET | Freq: Four times a day (QID) | ORAL | Status: DC | PRN

## 2019-03-24 MED ORDER — SODIUM CHLORIDE 0.9 % IV SOLN: INTRAVENOUS | Status: DC | PRN

## 2019-03-24 MED ORDER — BISACODYL 5 MG PO TBEC
10.0000 mg | DELAYED_RELEASE_TABLET | Freq: Every day | ORAL | Status: DC
  Administered 2019-03-24 – 2019-03-26 (×3): 10 mg via ORAL
  Filled 2019-03-24 (×4): qty 2

## 2019-03-24 MED ORDER — 0.9 % SODIUM CHLORIDE (POUR BTL) OPTIME
TOPICAL | Status: DC | PRN
  Administered 2019-03-24: 07:00:00 2000 mL

## 2019-03-24 MED ORDER — MORPHINE SULFATE 2 MG/ML IV SOLN
INTRAVENOUS | Status: DC
  Administered 2019-03-24: 14:00:00 2 mg via INTRAVENOUS
  Administered 2019-03-24: 19.5 mg via INTRAVENOUS
  Administered 2019-03-25: 1.5 mg via INTRAVENOUS

## 2019-03-24 MED ORDER — SODIUM CHLORIDE 0.9% FLUSH: 9.0000 mL | INTRAVENOUS | Status: DC | PRN

## 2019-03-24 MED ORDER — HEMOSTATIC AGENTS (NO CHARGE) OPTIME
TOPICAL | Status: DC | PRN
  Administered 2019-03-24: 1 via TOPICAL

## 2019-03-24 MED ORDER — CHLORHEXIDINE GLUCONATE CLOTH 2 % EX PADS
6.0000 | MEDICATED_PAD | Freq: Every day | CUTANEOUS | Status: DC
  Administered 2019-03-24: 6 via TOPICAL

## 2019-03-24 MED ORDER — DIPHENHYDRAMINE HCL 12.5 MG/5ML PO ELIX
12.5000 mg | ORAL_SOLUTION | Freq: Four times a day (QID) | ORAL | Status: DC | PRN
  Filled 2019-03-24: qty 5

## 2019-03-24 MED ORDER — INSULIN ASPART 100 UNIT/ML ~~LOC~~ SOLN
0.0000 [IU] | Freq: Four times a day (QID) | SUBCUTANEOUS | Status: DC
  Administered 2019-03-25: 2 [IU] via SUBCUTANEOUS

## 2019-03-24 MED ORDER — ACETAMINOPHEN 160 MG/5ML PO SOLN: 1000.0000 mg | Freq: Once | ORAL | Status: DC | PRN

## 2019-03-24 MED ORDER — MORPHINE SULFATE 2 MG/ML IV SOLN
INTRAVENOUS | Status: AC
  Administered 2019-03-24: 2 mg via INTRAVENOUS
  Filled 2019-03-24: qty 30

## 2019-03-24 MED ORDER — PROPOFOL 10 MG/ML IV BOLUS
INTRAVENOUS | Status: DC | PRN
  Administered 2019-03-24: 20 mg via INTRAVENOUS
  Administered 2019-03-24: 40 mg via INTRAVENOUS
  Administered 2019-03-24: 30 mg via INTRAVENOUS

## 2019-03-24 MED ORDER — NALOXONE HCL 0.4 MG/ML IJ SOLN: 0.4000 mg | INTRAMUSCULAR | Status: DC | PRN

## 2019-03-24 MED ORDER — ENOXAPARIN SODIUM 40 MG/0.4ML ~~LOC~~ SOLN
40.0000 mg | Freq: Every day | SUBCUTANEOUS | Status: DC
  Administered 2019-03-25 – 2019-03-27 (×3): 40 mg via SUBCUTANEOUS
  Filled 2019-03-24 (×3): qty 0.4

## 2019-03-24 MED ORDER — LIDOCAINE 2% (20 MG/ML) 5 ML SYRINGE
INTRAMUSCULAR | Status: DC | PRN
  Administered 2019-03-24: 80 mg via INTRAVENOUS

## 2019-03-24 MED ORDER — ACETAMINOPHEN 10 MG/ML IV SOLN: 1000.0000 mg | Freq: Once | INTRAVENOUS | Status: DC | PRN

## 2019-03-24 MED ORDER — ONDANSETRON HCL 4 MG/2ML IJ SOLN: 4.0000 mg | Freq: Four times a day (QID) | INTRAMUSCULAR | Status: DC | PRN

## 2019-03-24 MED ORDER — OXYCODONE HCL 5 MG PO TABS: 5.0000 mg | ORAL_TABLET | Freq: Once | ORAL | Status: DC | PRN

## 2019-03-24 MED ORDER — SUGAMMADEX SODIUM 200 MG/2ML IV SOLN
INTRAVENOUS | Status: DC | PRN
  Administered 2019-03-24: 300 mg via INTRAVENOUS

## 2019-03-24 MED ORDER — MIDAZOLAM HCL 2 MG/2ML IJ SOLN
INTRAMUSCULAR | Status: AC
  Filled 2019-03-24: qty 2

## 2019-03-24 MED ORDER — ONDANSETRON HCL 4 MG/2ML IJ SOLN
INTRAMUSCULAR | Status: DC | PRN
  Administered 2019-03-24: 4 mg via INTRAVENOUS

## 2019-03-24 SURGICAL SUPPLY — 103 items
APPLICATOR COTTON TIP 6 STRL (MISCELLANEOUS) ×18 IMPLANT
APPLICATOR COTTON TIP 6IN STRL (MISCELLANEOUS) ×36
BENZOIN TINCTURE PRP APPL 2/3 (GAUZE/BANDAGES/DRESSINGS) IMPLANT
BLADE CLIPPER SURG (BLADE) IMPLANT
CANISTER SUCT 3000ML PPV (MISCELLANEOUS) ×4 IMPLANT
CATH KIT ON-Q SILVERSOAK 5IN (CATHETERS) IMPLANT
CATH ROBINSON RED A/P 18FR (CATHETERS) IMPLANT
CATH THORACIC 28FR (CATHETERS) ×4 IMPLANT
CATH THORACIC 36FR (CATHETERS) IMPLANT
CATH THORACIC 36FR RT ANG (CATHETERS) IMPLANT
CLEANER TIP ELECTROSURG 2X2 (MISCELLANEOUS) ×4 IMPLANT
CLIP VESOCCLUDE MED 6/CT (CLIP) IMPLANT
CONN ST 1/4X3/8  BEN (MISCELLANEOUS)
CONN ST 1/4X3/8 BEN (MISCELLANEOUS) IMPLANT
CONN Y 3/8X3/8X3/8  BEN (MISCELLANEOUS)
CONN Y 3/8X3/8X3/8 BEN (MISCELLANEOUS) IMPLANT
CONT SPEC 4OZ CLIKSEAL STRL BL (MISCELLANEOUS) ×8 IMPLANT
COVER MAYO STAND STRL (DRAPES) ×4 IMPLANT
COVER SURGICAL LIGHT HANDLE (MISCELLANEOUS) ×4 IMPLANT
COVER WAND RF STERILE (DRAPES) IMPLANT
DERMABOND ADVANCED (GAUZE/BANDAGES/DRESSINGS) ×2
DERMABOND ADVANCED .7 DNX12 (GAUZE/BANDAGES/DRESSINGS) ×2 IMPLANT
DRAIN CHANNEL 28F RND 3/8 FF (WOUND CARE) IMPLANT
DRAIN CHANNEL 32F RND 10.7 FF (WOUND CARE) IMPLANT
DRAPE LAPAROSCOPIC ABDOMINAL (DRAPES) IMPLANT
DRAPE ORTHO SPLIT 77X108 STRL (DRAPES) ×2
DRAPE SLUSH/WARMER DISC (DRAPES) ×8 IMPLANT
DRAPE SURG ORHT 6 SPLT 77X108 (DRAPES) ×2 IMPLANT
DRAPE TABLE BACK 80X90 (DRAPES) ×4 IMPLANT
DRAPE WARM FLUID 44X44 (DRAPES) IMPLANT
ELECT BLADE 4.0 EZ CLEAN MEGAD (MISCELLANEOUS) ×4
ELECT REM PT RETURN 9FT ADLT (ELECTROSURGICAL) ×4
ELECTRODE BLDE 4.0 EZ CLN MEGD (MISCELLANEOUS) ×2 IMPLANT
ELECTRODE REM PT RTRN 9FT ADLT (ELECTROSURGICAL) ×2 IMPLANT
GAUZE SPONGE 4X4 12PLY STRL (GAUZE/BANDAGES/DRESSINGS) ×4 IMPLANT
GLOVE BIOGEL PI IND STRL 6.5 (GLOVE) ×8 IMPLANT
GLOVE BIOGEL PI INDICATOR 6.5 (GLOVE) ×8
GLOVE EUDERMIC 7 POWDERFREE (GLOVE) ×8 IMPLANT
GLOVE SURG SS PI 6.0 STRL IVOR (GLOVE) ×4 IMPLANT
GOWN STRL REUS W/ TWL LRG LVL3 (GOWN DISPOSABLE) ×6 IMPLANT
GOWN STRL REUS W/ TWL XL LVL3 (GOWN DISPOSABLE) ×2 IMPLANT
GOWN STRL REUS W/TWL LRG LVL3 (GOWN DISPOSABLE) ×6
GOWN STRL REUS W/TWL XL LVL3 (GOWN DISPOSABLE) ×2
HANDLE STAPLE ENDO GIA SHORT (STAPLE) ×4
KIT BASIN OR (CUSTOM PROCEDURE TRAY) ×4 IMPLANT
KIT TURNOVER KIT B (KITS) ×4 IMPLANT
L-HOOK LAP DISP 36CM (ELECTROSURGICAL) ×4
LHOOK LAP DISP 36CM (ELECTROSURGICAL) ×2 IMPLANT
NS IRRIG 1000ML POUR BTL (IV SOLUTION) ×8 IMPLANT
PACK CHEST (CUSTOM PROCEDURE TRAY) IMPLANT
PACK CV ACCESS (CUSTOM PROCEDURE TRAY) ×4 IMPLANT
PAD ARMBOARD 7.5X6 YLW CONV (MISCELLANEOUS) ×8 IMPLANT
PASSER SUT SWANSON 36MM LOOP (INSTRUMENTS) IMPLANT
PROGEL SPRAY TIP 11IN (MISCELLANEOUS) ×4
RELOAD EGIA 60 MED/THCK PURPLE (STAPLE) ×8 IMPLANT
RELOAD TRI 45 ART MED THCK PUR (STAPLE) ×8 IMPLANT
RELOAD TRI 60 ART MED THCK PUR (STAPLE) ×4 IMPLANT
SEALANT PROGEL (MISCELLANEOUS) ×4 IMPLANT
SEALANT SURG COSEAL 4ML (VASCULAR PRODUCTS) IMPLANT
SEALANT SURG COSEAL 8ML (VASCULAR PRODUCTS) IMPLANT
SLEEVE ENDOPATH XCEL 5M (ENDOMECHANICALS) ×4 IMPLANT
SLEEVE SURGEON STRL (DRAPES) ×4 IMPLANT
SOL ANTI FOG 6CC (MISCELLANEOUS) ×2 IMPLANT
SOLUTION ANTI FOG 6CC (MISCELLANEOUS) ×2
SPONGE INTESTINAL PEANUT (DISPOSABLE) ×4 IMPLANT
SPONGE LAP 18X18 RF (DISPOSABLE) ×4 IMPLANT
STAPLER ENDO GIA 12MM SHORT (STAPLE) ×4 IMPLANT
SUT PROLENE 3 0 SH DA (SUTURE) ×4 IMPLANT
SUT PROLENE 4 0 RB 1 (SUTURE)
SUT PROLENE 4-0 RB1 .5 CRCL 36 (SUTURE) IMPLANT
SUT SILK  1 MH (SUTURE) ×2
SUT SILK 1 MH (SUTURE) ×2 IMPLANT
SUT SILK 1 TIES 10X30 (SUTURE) IMPLANT
SUT SILK 2 0 SH (SUTURE) IMPLANT
SUT SILK 2 0SH CR/8 30 (SUTURE) IMPLANT
SUT SILK 3 0SH CR/8 30 (SUTURE) IMPLANT
SUT VIC AB 1 CTX 36 (SUTURE)
SUT VIC AB 1 CTX36XBRD ANBCTR (SUTURE) IMPLANT
SUT VIC AB 2 TP1 27 (SUTURE) IMPLANT
SUT VIC AB 2-0 CT1 27 (SUTURE) ×4
SUT VIC AB 2-0 CT1 TAPERPNT 27 (SUTURE) ×4 IMPLANT
SUT VIC AB 2-0 CTX 36 (SUTURE) IMPLANT
SUT VIC AB 2-0 UR6 27 (SUTURE) IMPLANT
SUT VIC AB 3-0 MH 27 (SUTURE) IMPLANT
SUT VIC AB 3-0 SH 27 (SUTURE)
SUT VIC AB 3-0 SH 27X BRD (SUTURE) IMPLANT
SUT VIC AB 3-0 X1 27 (SUTURE) ×4 IMPLANT
SUT VICRYL 2 TP 1 (SUTURE) IMPLANT
SYR BULB IRRIGATION 50ML (SYRINGE) IMPLANT
SYSTEM SAHARA CHEST DRAIN ATS (WOUND CARE) ×4 IMPLANT
SYSTEM SAHARA CHEST DRAIN RE-I (WOUND CARE) ×4 IMPLANT
TAPE CLOTH SURG 4X10 WHT LF (GAUZE/BANDAGES/DRESSINGS) ×4 IMPLANT
TIP APPLICATOR SPRAY EXTEND 16 (VASCULAR PRODUCTS) IMPLANT
TIP SPRAY PROGEL 11IN (MISCELLANEOUS) ×2 IMPLANT
TOWEL GREEN STERILE (TOWEL DISPOSABLE) ×4 IMPLANT
TOWEL GREEN STERILE FF (TOWEL DISPOSABLE) ×4 IMPLANT
TRAP SPECIMEN MUCOUS 40CC (MISCELLANEOUS) IMPLANT
TRAY FOLEY MTR SLVR 14FR STAT (SET/KITS/TRAYS/PACK) IMPLANT
TROCAR BLADELESS 5MM (ENDOMECHANICALS) ×4 IMPLANT
TROCAR XCEL BLADELESS 5X75MML (TROCAR) ×4 IMPLANT
TROCAR XCEL BLUNT TIP 100MML (ENDOMECHANICALS) ×4 IMPLANT
TUNNELER SHEATH ON-Q 11GX8 DSP (PAIN MANAGEMENT) IMPLANT
WATER STERILE IRR 1000ML POUR (IV SOLUTION) ×4 IMPLANT

## 2019-03-24 NOTE — Anesthesia Procedure Notes (Signed)
Arterial Line Insertion Performed by: Eligha Bridegroom, CRNA  radial was placed Catheter size: 20 G Hand hygiene performed  and maximum sterile barriers used   Attempts: 2 Procedure performed without using ultrasound guided technique. Following insertion, dressing applied and Biopatch. Post procedure assessment: normal

## 2019-03-24 NOTE — Brief Op Note (Signed)
03/18/2019 - 03/24/2019  11:44 AM  PATIENT:  Conan Bowens  68 y.o. male  PRE-OPERATIVE DIAGNOSIS:  Persistent air leak from spontaneous left pneumothorax  POST-OPERATIVE DIAGNOSIS:  Same  PROCEDURE:  Procedure(s): VIDEO ASSISTED THORACOSCOPY (Left) STAPLING OF BLEBS (Left)  SURGEON:  Surgeon(s) and Role:    * Bartle, Fernande Boyden, MD - Primary  PHYSICIAN ASSISTANT: Nicholes Rough, PA-C   ANESTHESIA:   general  EBL:  minimal   BLOOD ADMINISTERED:none  DRAINS: one 28 F Chest Tube(s) in the left pleural space   LOCAL MEDICATIONS USED:  NONE  SPECIMEN:  Source of Specimen:  left apical blebs  DISPOSITION OF SPECIMEN:  PATHOLOGY  COUNTS:  YES  TOURNIQUET:  * No tourniquets in log *  DICTATION: .Note written in EPIC  PLAN OF CARE: Admit to inpatient   PATIENT DISPOSITION:  PACU - hemodynamically stable.   Delay start of Pharmacological VTE agent (>24hrs) due to surgical blood loss or risk of bleeding: yes

## 2019-03-24 NOTE — Anesthesia Preprocedure Evaluation (Signed)
Anesthesia Evaluation  Patient identified by MRN, date of birth, ID band Patient awake    Reviewed: Allergy & Precautions, NPO status , Patient's Chart, lab work & pertinent test results, reviewed documented beta blocker date and time   History of Anesthesia Complications Negative for: history of anesthetic complications  Airway Mallampati: III  TM Distance: <3 FB Neck ROM: Full    Dental  (+) Dental Advisory Given   Pulmonary former smoker,    breath sounds clear to auscultation       Cardiovascular hypertension, Pt. on home beta blockers and Pt. on medications + CAD, + Past MI and + CABG   Rhythm:Regular     Neuro/Psych  Headaches, Anxiety  Neuromuscular disease    GI/Hepatic Neg liver ROS, GERD  ,  Endo/Other  negative endocrine ROS  Renal/GU Renal disease     Musculoskeletal negative musculoskeletal ROS (+)   Abdominal   Peds  Hematology negative hematology ROS (+)   Anesthesia Other Findings  The left ventricular ejection fraction is normal (55-65%).  Nuclear stress EF: 63%.  Blood pressure demonstrated a hypertensive response to exercise.  Upsloping ST segment depression ST segment depression was noted during stress in the II, III and aVF leads.  The study is normal.  This is a low risk study.  Reproductive/Obstetrics                             Anesthesia Physical Anesthesia Plan  ASA: III  Anesthesia Plan: General   Post-op Pain Management:    Induction: Intravenous  PONV Risk Score and Plan: 2 and Ondansetron and Dexamethasone  Airway Management Planned: Video Laryngoscope Planned  Additional Equipment: None  Intra-op Plan:   Post-operative Plan:   Informed Consent: I have reviewed the patients History and Physical, chart, labs and discussed the procedure including the risks, benefits and alternatives for the proposed anesthesia with the patient or  authorized representative who has indicated his/her understanding and acceptance.     Dental advisory given  Plan Discussed with: CRNA and Surgeon  Anesthesia Plan Comments:         Anesthesia Quick Evaluation

## 2019-03-24 NOTE — Plan of Care (Signed)

## 2019-03-24 NOTE — Anesthesia Procedure Notes (Signed)
Procedure Name: Intubation Date/Time: 03/24/2019 8:00 AM Performed by: Eligha Bridegroom, CRNA Pre-anesthesia Checklist: Emergency Drugs available, Patient identified, Suction available, Patient being monitored and Timeout performed Patient Re-evaluated:Patient Re-evaluated prior to induction Oxygen Delivery Method: Circle system utilized Preoxygenation: Pre-oxygenation with 100% oxygen Induction Type: IV induction Ventilation: Mask ventilation without difficulty and Oral airway inserted - appropriate to patient size Grade View: Grade II Endobronchial tube: Left, EBT position confirmed by fiberoptic bronchoscope, EBT position confirmed by auscultation and Double lumen EBT and 39 Fr Number of attempts: 1 Airway Equipment and Method: Stylet Placement Confirmation: ETT inserted through vocal cords under direct vision,  positive ETCO2 and breath sounds checked- equal and bilateral Tube secured with: Tape Dental Injury: Teeth and Oropharynx as per pre-operative assessment

## 2019-03-24 NOTE — Anesthesia Postprocedure Evaluation (Signed)
Anesthesia Post Note  Patient: Willie Black  Procedure(s) Performed: VIDEO ASSISTED THORACOSCOPY (Left ) STAPLING OF BLEBS (Left )     Patient location during evaluation: PACU Anesthesia Type: General Level of consciousness: awake and alert Pain management: pain level controlled Vital Signs Assessment: post-procedure vital signs reviewed and stable Respiratory status: spontaneous breathing, nonlabored ventilation, respiratory function stable and patient connected to nasal cannula oxygen Cardiovascular status: blood pressure returned to baseline and stable Postop Assessment: no apparent nausea or vomiting Anesthetic complications: no    Last Vitals:  Vitals:   03/24/19 1545 03/24/19 1608  BP:  134/69  Pulse:    Resp:  16  Temp: 36.5 C 37.4 C  SpO2:  93%    Last Pain:  Vitals:   03/24/19 1609  TempSrc:   PainSc: 8                  Ryenn Howeth

## 2019-03-24 NOTE — Op Note (Signed)
CARDIOTHORACIC SURGERY OPERATIVE NOTE:  Willie Black [id number] 03/24/2019   Preoperative Dx:  Spontaneous left pneumothorax with persistent air leak  Postoperative Dx: same   Procedure: Left video-assisted thoracoscopy, stapling of apical blebs, mechanical pleurodesis  Surgeon: Dr. Gaye Pollack   Assistant: Nicholes Rough, PA-C  Anesthesia: GET   Clinical History:   The patient is a 68 year old gentleman with history of coronary disease status post coronary bypass graft surgery in 2011 by me, previous smoking until that time, and left lower extremity DVT in February 2019 treated with Xarelto which he is still on reports that about 730 this morning he had a heavy sneeze and developed left-sided chest pain and shortness of breath afterwards.  He presented to the [facility] and a chest x-ray showed a large left pneumothorax.  A pigtail catheter was inserted by the emergency physician and the patient was transferred to my service at [facility].  He has no prior history of spontaneous pneumothorax. He had a persistent air leak for 4 days without much change. CT of the chest showed multiple bilateral apical and medial upper bullae. Therefore I felt it would be best to proceed with VATS for bleb stapling and mechanical pleurodesis.  I discussed the operative procedure with the patient and his wife including the alternative of continued chest tube drainage or talc pleurodesis through the chest tube.  I discussed the benefits and risk of surgery including but not limited to bleeding, infection, persistent air leak, recurrent pneumothorax, and he understands would like to proceed with surgery.    Operative procedure:   The patient was seen in the preoperative holding area. The proper patient, proper operative side, proper operation were confirmed after reviewing his history and chest x-ray. The left side of the chest was signed by me. Preoperative intravenous antibiotics were given.  He was taken back to the operating room and placed on table in the supine position. After induction of general endotracheal anesthesia using a double-lumen tube, a Foley catheter was placed in the bladder using sterile technique. Lower extremity sequential compression devices were used. The patient was positioned in the right lateral decubitus position with the left side up. The left side of the chest was prepped with Betadine soap and solution and draped in the usual sterile manner. A timeout was taken and the proper patient, proper operation, and proper operative side were confirmed with nursing and anesthesia staff. A 1 cm incision was made in the midaxillary line at about the 5th intercostal space. The right pleural space was entered bluntly with a hemostat and an 8 mm trocar was inserted. The 30 thoracoscope was inserted and the lung and pleural space inspected. There were several adhesions between the lateral portion of the upper lobe and the chest wall. The medial portion of the upper lobe was firmly adherent to the mediastinum along the course of the LIMA graft. There were multiple blebs at the apex and one of these appeared ruptured. There were moderately dense adhesions between the apex of the lung and the chest wall. I felt that mostly likely the air leak was from the ruptured left apical bleb and I would have to mobilize the apex from the chest wall to allow bleb stapling.  A second 1 cm incision was made in the anterior axillary line at the 4th ICS for the scope and a third similar incision in the posterior axillary line at the 5th ICS for the stapler. A fourth incision was required in  the posterior axillary line high up to allow exposure to the apex for mobilization of the apex with electrocautery. This was tedious and took a long time but there was no visible lung injury. After mobilizing the apex the apical blebs were resected using a linear cutting stapler. The specimen was removed  and sent to  pathology. A mechanical pleurodesis was performed using a piece of an abrasive pad placed in a long clamp. Then a 14 French chest tube was placed through the mid axillary incision and advanced to the apex.  The lung was reinflated under direct vision and I did not see any significant air leak. Hemostasis appeared adequate. The other incisions were then closed in layers using a 2-0 Vicryl subcutaneous suture and 3-0 Vicryl subcuticular skin closure. The chest tube was connected to Pleur-evac suction. The sponge needle and instrument counts were correct according to the scrub nurse. The patient was then turned into the supine position, extubated, and transported to the post anesthesia care unit in satisfactory and stable condition. There was no significant air leak seen after extubation.

## 2019-03-24 NOTE — Anesthesia Procedure Notes (Signed)
Central Venous Catheter Insertion Performed by: Annye Asa, MD, anesthesiologist Start/End9/30/2020 7:09 AM, 03/24/2019 7:27 AM Patient location: Pre-op. Preanesthetic checklist: patient identified, IV checked, site marked, risks and benefits discussed, surgical consent, monitors and equipment checked, pre-op evaluation, timeout performed and anesthesia consent Position: Trendelenburg Lidocaine 1% used for infiltration and patient sedated Hand hygiene performed , maximum sterile barriers used  and Seldinger technique used Catheter size: 8 Fr Central line was placed.Double lumen Procedure performed using ultrasound guided technique. Ultrasound Notes:anatomy identified, needle tip was noted to be adjacent to the nerve/plexus identified, no ultrasound evidence of intravascular and/or intraneural injection and image(s) printed for medical record Attempts: 1 Following insertion, line sutured, dressing applied and Biopatch. Post procedure assessment: no air, free fluid flow and blood return through all ports  Patient tolerated the procedure well with no immediate complications. Additional procedure comments: CVP: Timeout, sterile prep, drape, FBP R neck.  Trendelenburg position.  1% lido local, finder and trocar RIJ 1st pass with US guidance.  2 lumen placed over J wire. Biopatch and sterile dressing on.  Patient tolerated well.  VSS.  Jenita Seashore, MD.

## 2019-03-24 NOTE — Transfer of Care (Signed)
Immediate Anesthesia Transfer of Care Note  Patient: JODEN STOUDT  Procedure(s) Performed: VIDEO ASSISTED THORACOSCOPY (Left ) STAPLING OF BLEBS (Left )  Patient Location: PACU  Anesthesia Type:General  Level of Consciousness: awake and alert   Airway & Oxygen Therapy: Patient Spontanous Breathing and Patient connected to nasal cannula oxygen  Post-op Assessment: Report given to RN and Post -op Vital signs reviewed and stable  Post vital signs: Reviewed and stable  Last Vitals:  Vitals Value Taken Time  BP 132/67 03/24/19 1218  Temp    Pulse 67 03/24/19 1222  Resp 16 03/24/19 1222  SpO2 100 % 03/24/19 1222  Vitals shown include unvalidated device data.  Last Pain:  Vitals:   03/24/19 0440  TempSrc: Oral  PainSc:       Patients Stated Pain Goal: 5 ([id number])  Complications: No apparent anesthesia complications

## 2019-03-25 ENCOUNTER — Encounter (HOSPITAL_COMMUNITY): Payer: Self-pay | Admitting: Surgery

## 2019-03-25 ENCOUNTER — Inpatient Hospital Stay (HOSPITAL_COMMUNITY): Payer: Medicare Other

## 2019-03-25 LAB — GLUCOSE, CAPILLARY
Glucose-Capillary: 100 mg/dL — ABNORMAL HIGH (ref 70–99)
Glucose-Capillary: 113 mg/dL — ABNORMAL HIGH (ref 70–99)
Glucose-Capillary: 125 mg/dL — ABNORMAL HIGH (ref 70–99)
Glucose-Capillary: 136 mg/dL — ABNORMAL HIGH (ref 70–99)
Glucose-Capillary: 89 mg/dL (ref 70–99)

## 2019-03-25 LAB — CBC
HCT: 39.9 % (ref 39.0–52.0)
Hemoglobin: 12.8 g/dL — ABNORMAL LOW (ref 13.0–17.0)
MCH: 31.2 pg (ref 26.0–34.0)
MCHC: 32.1 g/dL (ref 30.0–36.0)
MCV: 97.3 fL (ref 80.0–100.0)
Platelets: 125 10*3/uL — ABNORMAL LOW (ref 150–400)
RBC: 4.1 MIL/uL — ABNORMAL LOW (ref 4.22–5.81)
RDW: 13.5 % (ref 11.5–15.5)
WBC: 4.5 10*3/uL (ref 4.0–10.5)
nRBC: 0 % (ref 0.0–0.2)

## 2019-03-25 LAB — BASIC METABOLIC PANEL
Anion gap: 7 (ref 5–15)
BUN: 9 mg/dL (ref 8–23)
CO2: 27 mmol/L (ref 22–32)
Calcium: 8.3 mg/dL — ABNORMAL LOW (ref 8.9–10.3)
Chloride: 101 mmol/L (ref 98–111)
Creatinine, Ser: 0.88 mg/dL (ref 0.61–1.24)
GFR calc Af Amer: >60 mL/min (ref >60)
GFR calc non Af Amer: >60 mL/min (ref >60)
Glucose, Bld: 126 mg/dL — ABNORMAL HIGH (ref 70–99)
Potassium: 4.5 mmol/L (ref 3.5–5.1)
Sodium: 135 mmol/L (ref 135–145)

## 2019-03-25 LAB — SURGICAL PATHOLOGY

## 2019-03-25 MED ORDER — METOPROLOL TARTRATE 25 MG PO TABS
25.0000 mg | ORAL_TABLET | Freq: Three times a day (TID) | ORAL | Status: DC
  Administered 2019-03-25 – 2019-03-27 (×6): 25 mg via ORAL
  Filled 2019-03-25 (×6): qty 1

## 2019-03-25 NOTE — TOC Progression Note (Signed)
Transition of Care [facility]) - Progression Note    Patient Details  Name: Willie Black MRN: [id number] Date of Birth: 06-06-1951  [facility] CM/SW Contact  Zenon Mayo, RN Phone Number: 03/25/2019, 9:55 AM  Clinical Narrative:    From home with spouse, s/p VATS, chest tube to water seal today, cxr in am.  TOC team will continue to follow for needs.        Expected Discharge Plan and Services                                                 Social Determinants of Health (SDOH) Interventions    Readmission Risk Interventions No flowsheet data found.

## 2019-03-25 NOTE — Discharge Summary (Signed)
[address]       [place] [address]             [phone]      Physician Discharge Summary  Patient ID: Willie Black MRN: [id number] DOB/AGE: 11/06/1950 68 y.o.  Admit date: 03/18/2019 Discharge date: 03/27/2019  Admission Diagnoses:  Patient Active Problem List   Diagnosis Date Noted  . Pneumothorax on left 03/18/2019  . Abnormal brain MRI 07/26/2018  . Benign prostatic hyperplasia without lower urinary tract symptoms 07/15/2018  . 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 Atypical squamoproliferative skin lesion 07/15/2018  . Hypercoagulable state ([place]) 02/25/2018  . Trigeminal neuralgia of right side of face 05/15/2016  . Other chronic sinusitis 05/01/2016  . PVC's (premature ventricular contractions) 04/19/2014  . Routine general medical examination at a health care facility 09/30/2013  . Abdominal aortic aneurysm ([place]) 09/30/2013  . Essential hypertension 05/15/2011  . GERD (gastroesophageal reflux disease)   . Hyperlipidemia LDL goal <70   . CAD (coronary artery disease)   . PULMONARY NODULE 04/12/2010    Discharge Diagnoses:  Active Problems:   Pneumothorax on left   S/P thoracotomy   Discharged Condition: good  HPI:   The patient is a 68 year old gentleman with history of coronary disease status post coronary bypass graft surgery in 2011 by me, previous smoking until that time, and left lower extremity DVT in February 2019 treated with Xarelto which he is still on reports that about 730 this morning he had a heavy sneeze and developed left-sided chest pain and shortness of breath afterwards.  He presented to the [facility] and a chest x-ray showed a large left pneumothorax.  A pigtail catheter was inserted by the emergency physician and the patient was transferred to my service at [facility].  He has no prior history of spontaneous pneumothorax.   Hospital Course:   The patient was admitted with a pigtail catheter for a  spontaneous pneumothorax which was inserted by the ER physician and the patient was transferred to [facility]. After a few days of watching and waiting the air leak did not improve and we were unable to progress the patient to water seal. It was decided that the patient would require surgery.  On 03/24/2019 Willie Black underwent a left video-assisted thoracoscopy, stapling of apical blebs, and mechanical pleurodesis with Dr. Cyndia Bent. He tolerated the procedure well, was extubated in PACU and was transferred to the stepdown unit on 2C. POD 1 his pain was well controlled. We changed his chest tube to water seal since there was no air leak. His fluids were decreased, his foley catheter was removed, and he was weaned off supplemental oxygen. He began to walk around the unit. We used a PCA and Toradol for pain relief. His chest tube was removed on 03/26/2019. His final chest xray showed a tiny apical pneumothorax on the left. Today, he is ambulating with limited assistance, he is tolerating room air, his incisions are healing well, and he is ready for discharge home.    Consults: None  Significant Diagnostic Studies:   CLINICAL DATA:  Sore chest ,chest tube present s/p thoracotomy  EXAM: PORTABLE CHEST 1 VIEW  COMPARISON:  03/24/2019  FINDINGS: RIGHT IJ central line tip overlies the superior vena cava. LEFT-sided chest tube is unchanged. Suspect a tiny pneumothorax, identified at the LEFT lung apex and LEFT base the. Small amount of subcutaneous gas on the LEFT. Minimal LEFT LOWER lobe atelectasis. No edema.  IMPRESSION: 1. Stable appearance of the chest tube on the LEFT. 2. Probably stable tiny LEFT pneumothorax.   Electronically Signed   By: Nolon Nations M.D.   On: 03/25/2019 09:11   Treatments:    CARDIOTHORACIC SURGERY OPERATIVE NOTE:  SAMANTHA PIETRZYK [id number] 03/24/2019   Preoperative Dx:  Spontaneous left pneumothorax with persistent air  leak  Postoperative Dx: same   Procedure: Left video-assisted thoracoscopy, stapling of apical blebs, mechanical pleurodesis  Surgeon: Dr. Gaye Pollack   Assistant: Nicholes Rough, PA-C  Anesthesia: GET   Clinical History:   The patient is a 68 year old gentleman with history of coronary disease status post coronary bypass graft surgery in 2011 by me, previous smoking until that time,andleft lower extremity DVTinFebruary 2019 treated with Xarelto which he is still on reports that about 730 this morning he had a heavy sneeze and developed left-sided chest pain and shortness of breath afterwards. He presented to the [facility] and a chest x-ray showed a large left pneumothorax. A pigtail catheter was inserted by the emergency physician and the patient was transferred to my service at [facility]. He has no prior history of spontaneous pneumothorax. He had a persistent air leak for 4 days without much change. CT of the chest showed multiple bilateral apical and medial upper bullae. Therefore I felt it would be best to proceed with VATS for bleb stapling and mechanical pleurodesis. I discussed the operative procedure with the patient and his wife including the alternative of continued chest tube drainage or talc pleurodesis through the chest tube. I discussed the benefits and risk of surgery including but not limited to bleeding, infection, persistent air leak, recurrent pneumothorax, and he understands would like to proceed with surgery.    Discharge Exam: Blood pressure 122/72, pulse 68, temperature 99.3 F (37.4 C), temperature source Oral, resp. rate 16, height 6\' 2"  (1.88 m), weight 113.4 kg, SpO2 96 %.    Physical Exam General appearance: alert, cooperative and no distress Heart: regular rate and rhythm Lungs: Breath sounds are clear. No sub Q air.  Wound: The VATS port insicions and CT exit site are clean and dry.   Disposition:    Allergies as of 03/27/2019       Reactions   Penicillins Rash   Did it involve swelling of the face/tongue/throat, SOB, or low BP? Unknown Did it involve sudden or severe rash/hives, skin peeling, or any reaction on the inside of your mouth or nose? Unknown Did you need to seek medical attention at a hospital or doctor's office? Unknown When did it last happen?childhood If all above answers are "NO", may proceed with cephalosporin use.      Medication List    TAKE these medications   acetaminophen 325 MG tablet Commonly known as: Tylenol Take 2 tablets (650 mg total) by mouth every 4 (four) hours as needed.   aspirin EC 81 MG tablet Take 81 mg by mouth daily.   escitalopram 20 MG tablet Commonly known as: LEXAPRO Take 20 mg by mouth daily.   esomeprazole 20 MG capsule Commonly known as: NEXIUM Take 20 mg by mouth daily at 12 noon.   metoprolol tartrate 25 MG tablet Commonly known as: LOPRESSOR TAKE 1 TABLET BY MOUTH THREE TIMES A DAY What changed: when to take this   OXcarbazepine 150 MG tablet Commonly known as: TRILEPTAL Take 300 mg by mouth daily.   Potassium 99 MG Tabs Take 1 tablet by mouth daily.   rosuvastatin 20  MG tablet Commonly known as: CRESTOR TAKE 1 TABLET BY MOUTH EVERY DAY What changed: how much to take   traMADol 50 MG tablet Commonly known as: ULTRAM Take 1 tablet (50 mg total) by mouth every 6 (six) hours as needed for up to 3 days for moderate pain (mild pain).   vitamin E 100 UNIT capsule Take 100 Units by mouth daily.   Xarelto 10 MG Tabs tablet Generic drug: rivaroxaban TAKE 1 TABLET BY MOUTH EVERY DAY What changed:   how much to take  when to take this      Follow-up Information    Janith Lima, MD. Call in 1 day(s).   Specialty: Internal Medicine Contact information: [address] [address] [place], PA-C. Go on 04/27/2019.   Specialties: Cardiology, Radiology Why: @10am  for hospital  follow up with Dr. Doug Sou PA Contact information: [address] [place] [address] [phone]        suture removal Follow up.   Why: Your nursing suture removal appointment is on 04/02/2019 at 11:30am.  Contact information: Dr. Vivi Martens office.        Gaye Pollack, MD Follow up.   Specialty: Cardiothoracic Surgery Why: Your routine follow-up appointment is on 04/07/2019 at 9 AM.  Please arrive at 8:30 AM for a chest x-ray located at [facility] imaging which is on the first floor of our building. Contact information: [address] [place] [address] [phone]           Signed: Antony Odea, PA-C 03/27/2019, 11:00 AM

## 2019-03-25 NOTE — Progress Notes (Signed)
      [address]       [place] [address]             [phone]      1 Day Post-Op Procedure(s) (LRB): VIDEO ASSISTED THORACOSCOPY (Left) STAPLING OF BLEBS (Left) Subjective: Feels good this morning Pain is well controlled.  Objective: Vital signs in last 24 hours: Temp:  [97.4 F (36.3 C)-99.6 F (37.6 C)] 97.8 F (36.6 C) (10/01 0732) Pulse Rate:  [53-78] 53 (10/01 0732) Cardiac Rhythm: Sinus bradycardia;Bundle branch block (10/01 0704) Resp:  [8-26] 11 (10/01 0732) BP: (96-155)/(59-80) 96/65 (10/01 0732) SpO2:  [91 %-100 %] 94 % (10/01 0732)     Intake/Output from previous day: 09/30 0701 - 10/01 0700 In: 2240 [P.O.:240; I.V.:1800] Out: 2650 [Urine:2465; Blood:30; Chest Tube:155] Intake/Output this shift: No intake/output data recorded.  General appearance: alert, cooperative and no distress Heart: sinus bradycardia Lungs: clear to auscultation bilaterally and diminished in the lower lungs Abdomen: soft, non-tender; bowel sounds normal; no masses,  no organomegaly Extremities: extremities normal, atraumatic, no cyanosis or edema Wound: clean and dry  Lab Results: Recent Labs    03/25/19 0510  WBC 4.5  HGB 12.8*  HCT 39.9  PLT 125*   BMET:  Recent Labs    03/25/19 0510  NA 135  K 4.5  CL 101  CO2 27  GLUCOSE 126*  BUN 9  CREATININE 0.88  CALCIUM 8.3*    PT/INR: No results for input(s): LABPROT, INR in the last 72 hours. ABG    Component Value Date/Time   PHART 7.347 (L) 05/28/2010 1952   HCO3 24.1 (H) 05/28/2010 1952   TCO2 23 05/29/2010 1732   ACIDBASEDEF 1.0 05/28/2010 1952   O2SAT 89.0 05/28/2010 1952   CBG (last 3)  Recent Labs    03/24/19 1923 03/24/19 2358 03/25/19 0619  GLUCAP 156* 136* 125*    Assessment/Plan: S/P Procedure(s) (LRB): VIDEO ASSISTED THORACOSCOPY (Left) STAPLING OF BLEBS (Left)  1. CXR stable this morning. Only 155cc out over 24 hours. No air leak. Can progress to water seal if okay  with Dr. Cyndia Bent.  2. CV- bradycardia at times. On metoprolol with holding parameters.  3. Tolerating room air with good oxygen saturation. Continue to use incentive spirometer. 4. Blood glucose is well controlled. SSI as needed.   Plan: see progression orders. Discontinue foley catheter. OOB to chair. Cut fluids back since the patient is drinking and eating. Water seal today with a follow-up CXR tomorrow morning.    LOS: 7 days    Elgie Collard 03/25/2019

## 2019-03-26 ENCOUNTER — Inpatient Hospital Stay (HOSPITAL_COMMUNITY): Payer: Medicare Other

## 2019-03-26 LAB — GLUCOSE, CAPILLARY
Glucose-Capillary: 101 mg/dL — ABNORMAL HIGH (ref 70–99)
Glucose-Capillary: 74 mg/dL (ref 70–99)
Glucose-Capillary: 81 mg/dL (ref 70–99)

## 2019-03-26 LAB — CBC
HCT: 37.4 % — ABNORMAL LOW (ref 39.0–52.0)
Hemoglobin: 12 g/dL — ABNORMAL LOW (ref 13.0–17.0)
MCH: 31.1 pg (ref 26.0–34.0)
MCHC: 32.1 g/dL (ref 30.0–36.0)
MCV: 96.9 fL (ref 80.0–100.0)
Platelets: UNDETERMINED 10*3/uL (ref 150–400)
RBC: 3.86 MIL/uL — ABNORMAL LOW (ref 4.22–5.81)
RDW: 13.8 % (ref 11.5–15.5)
WBC: 3.7 10*3/uL — ABNORMAL LOW (ref 4.0–10.5)
nRBC: 0 % (ref 0.0–0.2)

## 2019-03-26 LAB — BASIC METABOLIC PANEL
Anion gap: 9 (ref 5–15)
BUN: 20 mg/dL (ref 8–23)
CO2: 25 mmol/L (ref 22–32)
Calcium: 8 mg/dL — ABNORMAL LOW (ref 8.9–10.3)
Chloride: 100 mmol/L (ref 98–111)
Creatinine, Ser: 1.07 mg/dL (ref 0.61–1.24)
GFR calc Af Amer: >60 mL/min (ref >60)
GFR calc non Af Amer: >60 mL/min (ref >60)
Glucose, Bld: 109 mg/dL — ABNORMAL HIGH (ref 70–99)
Potassium: 4.2 mmol/L (ref 3.5–5.1)
Sodium: 134 mmol/L — ABNORMAL LOW (ref 135–145)

## 2019-03-26 MED ORDER — GUAIFENESIN ER 600 MG PO TB12
1200.0000 mg | ORAL_TABLET | Freq: Two times a day (BID) | ORAL | Status: DC
  Administered 2019-03-26 – 2019-03-27 (×3): 1200 mg via ORAL
  Filled 2019-03-26 (×3): qty 2

## 2019-03-26 NOTE — Progress Notes (Addendum)
      [address]       [place] [address]             [phone]      2 Days Post-Op Procedure(s) (LRB): VIDEO ASSISTED THORACOSCOPY (Left) STAPLING OF BLEBS (Left) Subjective: Sleepy this morning with a wet cough. He admits to swallowing mucous instead of spitting it up.   Objective: Vital signs in last 24 hours: Temp:  [97.8 F (36.6 C)-99.2 F (37.3 C)] 99.2 F (37.3 C) (10/02 0728) Pulse Rate:  [60-80] 66 (10/02 0728) Cardiac Rhythm: Normal sinus rhythm (10/02 0728) Resp:  [12-18] 13 (10/02 0728) BP: (94-130)/(50-99) 94/59 (10/02 0728) SpO2:  [94 %-98 %] 94 % (10/02 0728)     Intake/Output from previous day: 10/01 0701 - 10/02 0700 In: 1610 [P.O.:840; I.V.:770] Out: 240 [Urine:100; Chest Tube:140] Intake/Output this shift: No intake/output data recorded.  General appearance: alert, cooperative and no distress Heart: regular rate and rhythm, S1, S2 normal, no murmur, click, rub or gallop Lungs: clear to auscultation bilaterally Abdomen: soft, non-tender; bowel sounds normal; no masses,  no organomegaly Extremities: 1+ non pitting pedal edema Wound: clean and dry  Lab Results: Recent Labs    03/25/19 0510 03/26/19 0227  WBC 4.5 3.7*  HGB 12.8* 12.0*  HCT 39.9 37.4*  PLT 125* PLATELET CLUMPS NOTED ON SMEAR, UNABLE TO ESTIMATE   BMET:  Recent Labs    03/25/19 0510 03/26/19 0227  NA 135 134*  K 4.5 4.2  CL 101 100  CO2 27 25  GLUCOSE 126* 109*  BUN 9 20  CREATININE 0.88 1.07  CALCIUM 8.3* 8.0*    PT/INR: No results for input(s): LABPROT, INR in the last 72 hours. ABG    Component Value Date/Time   PHART 7.347 (L) 05/28/2010 1952   HCO3 24.1 (H) 05/28/2010 1952   TCO2 23 05/29/2010 1732   ACIDBASEDEF 1.0 05/28/2010 1952   O2SAT 89.0 05/28/2010 1952   CBG (last 3)  Recent Labs    03/25/19 1602 03/25/19 2148 03/26/19 0610  GLUCAP 113* 100* 101*    Assessment/Plan: S/P Procedure(s) (LRB): VIDEO ASSISTED THORACOSCOPY  (Left) STAPLING OF BLEBS (Left)   1. CXR stable this morning. Only 140cc out over 24 hours. No air leak.  2. CV- NSR in the 60s On metoprolol with holding parameters. BP remains borderline.  3. Tolerating room air with good oxygen saturation. Continue to use incentive spirometer. 4. Blood glucose is well controlled. SSI as needed.   Plan:Discontinue fluids. OOB to chair. Ambulate in the halls. No air leak this morning and CXR stable. Will remove chest tube if okay with Dr. Cyndia Bent. CXR in the morning. Will order mucinex for secretions. Home this weekend.     LOS: 8 days    Elgie Collard 03/26/2019   Chart reviewed, patient examined, agree with above. CXR looks good, no air leak from chest tube so will remove.  2V CXR in am and home tomorrow if no change.

## 2019-03-26 NOTE — Progress Notes (Signed)
Encouraged to walk along the hallway, but he claimed he feels cold noted to be shivering. Temp-99.8 orally. Tylenol given. Dressing to left lat.chest soaked with serous drainage. Reinforced. Continue to monitor.

## 2019-03-26 NOTE — Discharge Instructions (Signed)

## 2019-03-27 ENCOUNTER — Inpatient Hospital Stay (HOSPITAL_COMMUNITY): Payer: Medicare Other

## 2019-03-27 LAB — GLUCOSE, CAPILLARY
Glucose-Capillary: 87 mg/dL (ref 70–99)
Glucose-Capillary: 91 mg/dL (ref 70–99)

## 2019-03-27 MED ORDER — ACETAMINOPHEN 325 MG PO TABS: 650.0000 mg | ORAL_TABLET | ORAL | 2 refills | Status: AC | PRN

## 2019-03-27 MED ORDER — TRAMADOL HCL 50 MG PO TABS: 50.0000 mg | ORAL_TABLET | Freq: Four times a day (QID) | ORAL | 0 refills | Status: AC | PRN

## 2019-03-27 NOTE — Progress Notes (Addendum)
3 Days Post-Op Procedure(s) (LRB): VIDEO ASSISTED THORACOSCOPY (Left) STAPLING OF BLEBS (Left) Subjective: Sitting up in chair with his wife visiting. Says pain is controlled with Tylenol. Denies shortness of breath. Good sats on RA.  Objective: Vital signs in last 24 hours: Temp:  [99.3 F (37.4 C)-100.9 F (38.3 C)] 99.3 F (37.4 C) (10/03 0731) Pulse Rate:  [64-80] 68 (10/03 1034) Cardiac Rhythm: Normal sinus rhythm (10/03 0700) Resp:  [16-18] 16 (10/03 0310) BP: (117-144)/(56-73) 122/72 (10/03 1034) SpO2:  [95 %-98 %] 96 % (10/03 0731)      Intake/Output from previous day: 10/02 0701 - 10/03 0700 In: 360 [P.O.:360] Out: -  Intake/Output this shift: No intake/output data recorded.  General appearance: alert, cooperative and no distress Heart: regular rate and rhythm Lungs: Breath sounds are clear. No sub Q air.  Wound: The VATS port insicions and CT exit site are clean and dry.   Lab Results: Recent Labs    03/25/19 0510 03/26/19 0227  WBC 4.5 3.7*  HGB 12.8* 12.0*  HCT 39.9 37.4*  PLT 125* PLATELET CLUMPS NOTED ON SMEAR, UNABLE TO ESTIMATE   BMET:  Recent Labs    03/25/19 0510 03/26/19 0227  NA 135 134*  K 4.5 4.2  CL 101 100  CO2 27 25  GLUCOSE 126* 109*  BUN 9 20  CREATININE 0.88 1.07  CALCIUM 8.3* 8.0*    PT/INR: No results for input(s): LABPROT, INR in the last 72 hours. ABG    Component Value Date/Time   PHART 7.347 (L) 05/28/2010 1952   HCO3 24.1 (H) 05/28/2010 1952   TCO2 23 05/29/2010 1732   ACIDBASEDEF 1.0 05/28/2010 1952   O2SAT 89.0 05/28/2010 1952   CBG (last 3)  Recent Labs    03/26/19 1626 03/27/19 0003 03/27/19 0611  GLUCAP 81 87 91    EXAM: CHEST - 2 VIEW  COMPARISON:  03/26/2019  FINDINGS: Interval removal left-sided chest tube. Lungs are adequately inflated demonstrate suggestion of a small left apical pneumothorax. No focal lobar consolidation or effusion. Mild prominence of the perihilar markings left worse  than right. Postsurgical change over the left hilar region. Cardiomediastinal silhouette and remainder of the exam is unchanged.  IMPRESSION: Possible minimal vascular congestion.  Interval removal of left-sided chest tube. Suggestion of small apical pneumothorax.  These results were called by telephone at the time of interpretation on 03/27/2019 at 9:49 am to patient's nurse, Driscilla Grammes, who verbally acknowledged these results.   Electronically Signed   By: Marin Olp M.D.   On: 03/27/2019 09:49 Assessment/Plan: S/P Procedure(s) (LRB): VIDEO ASSISTED THORACOSCOPY (Left) STAPLING OF BLEBS (Left)  -POD 3 Left video-assisted thoracoscopy, stapling of apical blebs, mechanical pleurodesis.  Respiratory status stable since removal of the CT yesterday. CXR shows ? of a tiny left apical PTX. Low-grade fever is not unexpected after mechanical pleurodesis.  OK to discharge home today.  Encouraged to continue frequent ambulation and use of IS.  He will follow up in the office on 10/9 for suture removal and on 10/14 for CXR and office visit with Dr. Cyndia Bent.      LOS: 9 days    Antony Odea, [place] [phone] 03/27/2019

## 2019-03-29 ENCOUNTER — Telehealth: Payer: Self-pay | Admitting: *Deleted

## 2019-03-29 NOTE — Telephone Encounter (Signed)
Called pt to make hosp f/u w/dr. Ronnald Ramp. Pt states not sure why he need to follow-up w/him. He was told to f/u w/the cardiologist. Looked over d/c pt underwent a thoracoscopy, stpling of apical blebs, and mechanical pleurodesis w/dr. Cyndia Bent. Pt states he feels fine and don't need to come in to see him. He will follow=-up w/cardiology. Also confirm appt for suture removal on 04/02/19 which pt was aware of appt.Marland Kitchen/LMB

## 2019-04-01 ENCOUNTER — Telehealth: Payer: Self-pay

## 2019-04-01 ENCOUNTER — Other Ambulatory Visit: Payer: Self-pay

## 2019-04-01 ENCOUNTER — Ambulatory Visit (INDEPENDENT_AMBULATORY_CARE_PROVIDER_SITE_OTHER)
Admission: RE | Admit: 2019-04-01 | Discharge: 2019-04-01 | Disposition: A | Discharge status: Home / Self Care | Payer: Medicare Other | Source: Ambulatory Visit | Attending: Internal Medicine | Admitting: Internal Medicine

## 2019-04-01 ENCOUNTER — Encounter: Payer: Self-pay | Admitting: Internal Medicine

## 2019-04-01 ENCOUNTER — Ambulatory Visit (INDEPENDENT_AMBULATORY_CARE_PROVIDER_SITE_OTHER): Payer: Medicare Other | Admitting: Internal Medicine

## 2019-04-01 VITALS — BP 118/68 | HR 76 | Temp 98.4°F | Resp 16

## 2019-04-01 DIAGNOSIS — D539 Nutritional anemia, unspecified: Secondary | ICD-10-CM | POA: Diagnosis not present

## 2019-04-01 DIAGNOSIS — I10 Essential (primary) hypertension: Secondary | ICD-10-CM | POA: Diagnosis not present

## 2019-04-01 DIAGNOSIS — J939 Pneumothorax, unspecified: Secondary | ICD-10-CM

## 2019-04-01 DIAGNOSIS — G8918 Other acute postprocedural pain: Secondary | ICD-10-CM

## 2019-04-01 DIAGNOSIS — G4701 Insomnia due to medical condition: Secondary | ICD-10-CM | POA: Diagnosis not present

## 2019-04-01 MED ORDER — BELSOMRA 15 MG PO TABS: 1.0000 | ORAL_TABLET | Freq: Every day | ORAL | 1 refills | Status: DC

## 2019-04-01 MED ORDER — TRAMADOL HCL 50 MG PO TABS: 50.0000 mg | ORAL_TABLET | Freq: Four times a day (QID) | ORAL | 0 refills | Status: DC | PRN

## 2019-04-01 NOTE — Patient Instructions (Signed)

## 2019-04-01 NOTE — Progress Notes (Signed)
Subjective:  Patient ID: Willie Black, male    DOB: April 09, 1951  Age: 68 y.o. MRN: [id number]  CC: Follow-up   HPI Willie Black presents for f/up -about 3 or 4 weeks ago he developed a spontaneous pneumothorax after sneezing.  He underwent a thoracotomy to treat this.  The surgery was about 3 weeks ago.  His cardiothoracic surgeon had been prescribing tramadol for pain but he has run out of the tramadol.  He continues to have pain on the left side.  He also complains of insomnia.  He says the cough is resolved and he denies shortness of breath, fever, chills, or hemoptysis.  Outpatient Medications Prior to Visit  Medication Sig Dispense Refill   acetaminophen (TYLENOL) 325 MG tablet Take 2 tablets (650 mg total) by mouth every 4 (four) hours as needed. 100 tablet 2   aspirin EC 81 MG tablet Take 81 mg by mouth daily.     escitalopram (LEXAPRO) 20 MG tablet Take 20 mg by mouth daily.  1   esomeprazole (NEXIUM) 20 MG capsule Take 20 mg by mouth daily at 12 noon.     metoprolol tartrate (LOPRESSOR) 25 MG tablet TAKE 1 TABLET BY MOUTH THREE TIMES A DAY (Patient taking differently: Take 25 mg by mouth 2 (two) times daily. ) 270 tablet 3   OXcarbazepine (TRILEPTAL) 150 MG tablet Take 300 mg by mouth daily.      Potassium 99 MG TABS Take 1 tablet by mouth daily.     rosuvastatin (CRESTOR) 20 MG tablet TAKE 1 TABLET BY MOUTH EVERY DAY (Patient taking differently: Take 10 mg by mouth daily. ) 90 tablet 1   vitamin E 100 UNIT capsule Take 100 Units by mouth daily.     XARELTO 10 MG TABS tablet TAKE 1 TABLET BY MOUTH EVERY DAY (Patient taking differently: Take 10 mg by mouth daily with supper. ) 90 tablet 1   No facility-administered medications prior to visit.     ROS Review of Systems  Constitutional: Positive for unexpected weight change (wt loss). Negative for chills, diaphoresis, fatigue and fever.  HENT: Negative.  Negative for sore throat and trouble swallowing.     Eyes: Negative.   Respiratory: Negative for chest tightness, shortness of breath and wheezing.   Cardiovascular: Positive for chest pain. Negative for palpitations and leg swelling.  Gastrointestinal: Negative for abdominal pain, constipation, diarrhea, nausea and vomiting.  Endocrine: Negative.   Genitourinary: Negative.  Negative for difficulty urinating, dysuria, hematuria and urgency.  Musculoskeletal: Negative for arthralgias and myalgias.  Skin: Negative.  Negative for color change and rash.  Neurological: Negative.  Negative for dizziness, weakness, light-headedness and headaches.  Hematological: Negative for adenopathy. Does not bruise/bleed easily.  Psychiatric/Behavioral: Positive for sleep disturbance. Negative for behavioral problems and dysphoric mood. The patient is not nervous/anxious.     Objective:  BP 118/68    Pulse 76    Temp 98.4 F (36.9 C) (Oral)    Resp 16    SpO2 94%   BP Readings from Last 3 Encounters:  04/01/19 118/68  03/27/19 112/67  08/03/18 (!) 147/78    Wt Readings from Last 3 Encounters:  03/18/19 250 lb (113.4 kg)  08/03/18 260 lb (117.9 kg)  07/15/18 260 lb (117.9 kg)    Physical Exam Constitutional:      General: He is not in acute distress.    Appearance: He is not ill-appearing, toxic-appearing or diaphoretic.  HENT:     Nose: Nose normal.  Mouth/Throat:     Mouth: Mucous membranes are moist.     Pharynx: No oropharyngeal exudate.  Eyes:     General: No scleral icterus.    Conjunctiva/sclera: Conjunctivae normal.  Neck:     Musculoskeletal: Normal range of motion.  Cardiovascular:     Rate and Rhythm: Normal rate and regular rhythm.     Heart sounds: No murmur. No gallop.   Pulmonary:     Effort: Pulmonary effort is normal. No tachypnea, accessory muscle usage or respiratory distress.     Breath sounds: No stridor. Examination of the left-middle field reveals decreased breath sounds. Examination of the left-lower field  reveals decreased breath sounds. Decreased breath sounds present. No wheezing, rhonchi or rales.  Chest:     Chest wall: Deformity and tenderness present. No mass or crepitus.     Comments: Surgical sites on the left lateral chest wall are healing nicely with no erythema, tenderness, exudate, or streaking. Abdominal:     General: Abdomen is flat. Bowel sounds are normal.     Palpations: There is no hepatomegaly or splenomegaly.     Tenderness: There is no abdominal tenderness.  Musculoskeletal: Normal range of motion.     Right lower leg: No edema.     Left lower leg: No edema.  Lymphadenopathy:     Cervical: No cervical adenopathy.  Skin:    General: Skin is warm and dry.  Neurological:     General: No focal deficit present.     Mental Status: He is alert.  Psychiatric:        Mood and Affect: Mood normal.        Behavior: Behavior normal.     Lab Results  Component Value Date   WBC 3.7 (L) 03/26/2019   HGB 12.0 (L) 03/26/2019   HCT 37.4 (L) 03/26/2019   PLT PLATELET CLUMPS NOTED ON SMEAR, UNABLE TO ESTIMATE 03/26/2019   GLUCOSE 109 (H) 03/26/2019   CHOL 213 (H) 07/15/2018   TRIG 162.0 (H) 07/15/2018   HDL 55.20 07/15/2018   LDLCALC 125 (H) 07/15/2018   ALT 17 07/15/2018   AST 17 07/15/2018   NA 134 (L) 03/26/2019   K 4.2 03/26/2019   CL 100 03/26/2019   CREATININE 1.07 03/26/2019   BUN 20 03/26/2019   CO2 25 03/26/2019   TSH 0.98 07/15/2018   PSA 0.64 07/15/2018   INR 0.97 11/22/2010    Dg Chest 1v Repeat Same Day  Result Date: 03/18/2019 CLINICAL DATA:  Chest tube placement EXAM: CHEST - 1 VIEW SAME DAY COMPARISON:  03/18/2027 8:47 a.m. FINDINGS: There is been interval placement of a left-sided chest tube. The chest tube projects over the left upper lung zone. The previously noted left-sided pneumothorax is nearly entirely resolved. The heart size remains enlarged. The patient is status post prior median sternotomy. There is no significant pleural effusion.  IMPRESSION: Status post placement of a left-sided chest tube with near complete resolution of the left-sided pneumothorax. Electronically Signed   By: Constance Holster M.D.   On: 03/18/2019 12:04   Dg Chest Port 1 View  Result Date: 03/19/2019 CLINICAL DATA:  Chest 2, coronary artery disease post MI and CABG, GERD EXAM: PORTABLE CHEST 1 VIEW COMPARISON:  Portable exam 0621 hours compared to 03/18/2019 FINDINGS: Pigtail LEFT thoracostomy tube again seen. Normal heart size post CABG. Mediastinal contours and pulmonary vascularity normal. Lungs clear. No pleural effusion or acute infiltrate seen. Small LEFT apex pneumothorax identified. Bones demineralized. Prior cervicothoracic fusion. IMPRESSION:  Small LEFT apex pneumothorax despite thoracostomy tube. Electronically Signed   By: Lavonia Dana M.D.   On: 03/19/2019 09:12   Dg Chest Port 1 View  Result Date: 03/18/2019 CLINICAL DATA:  Onset left chest pain this morning after sneezing. EXAM: PORTABLE CHEST 1 VIEW COMPARISON:  Single-view of the chest and CT chest 11/22/2010. FINDINGS: The patient has a moderately large left pneumothorax estimated at 60-70%. No midline shift. The right lung is expanded and clear. Lungs are emphysematous. The patient is status post CABG. Heart size is upper normal. IMPRESSION: Left pneumothorax estimated at 60-70%. Emphysema. Critical Value/emergent results were called by telephone at the time of interpretation on 03/18/2019 at 9:16 am to providerMICHAEL BUTLER , who verbally acknowledged these results. Electronically Signed   By: Inge Rise M.D.   On: 03/18/2019 09:16    Dg Chest 2 View  Result Date: 04/01/2019 CLINICAL DATA:  Follow-up left pneumothorax EXAM: CHEST - 2 VIEW COMPARISON:  03/27/2019 FINDINGS: Cardiac shadows within normal limits. Postsurgical changes are again seen. The right lung is stable with some apically scarring. Minimal residual left apical pneumothorax is noted although not significantly  increased when compared with the prior exam. There is been overall decrease in the degree of subcutaneous emphysema. No bony abnormality is noted. IMPRESSION: Persistent small pneumothorax on the left. Electronically Signed   By: Inez Catalina M.D.   On: 04/01/2019 16:36    Assessment & Plan:   Malekhi was seen today for follow-up.  Diagnoses and all orders for this visit:  Essential hypertension- His blood pressure is adequately well controlled.  Acute post-operative pain -     traMADol (ULTRAM) 50 MG tablet; Take 1 tablet (50 mg total) by mouth every 6 (six) hours as needed for up to 10 days.  Pneumothorax on left- Based on his symptoms, exam, and chest x-ray this is improving. -     DG Chest 2 View; Future  Insomnia due to medical condition -     Suvorexant (BELSOMRA) 15 MG TABS; Take 1 tablet by mouth at bedtime.  Deficiency anemia- I will recheck his H&H and will monitor him for vitamin deficiencies. -     CBC with Differential/Platelet; Future -     Vitamin B12; Future -     IBC panel; Future -     Folate; Future -     Ferritin; Future -     Vitamin B1; Future   I am having Willie Black start on Belsomra and traMADol. I am also having him maintain his escitalopram, metoprolol tartrate, OXcarbazepine, Xarelto, rosuvastatin, esomeprazole, Potassium, vitamin E, aspirin EC, and acetaminophen.  Meds ordered this encounter  Medications   Suvorexant (BELSOMRA) 15 MG TABS    Sig: Take 1 tablet by mouth at bedtime.    Dispense:  30 tablet    Refill:  1   traMADol (ULTRAM) 50 MG tablet    Sig: Take 1 tablet (50 mg total) by mouth every 6 (six) hours as needed for up to 10 days.    Dispense:  30 tablet    Refill:  0     Follow-up: Return in about 1 month (around 05/02/2019).  Scarlette Calico, MD

## 2019-04-01 NOTE — Telephone Encounter (Signed)
Willie Black is C/O low grade fevers since hospital discharge. The fevers range from 99.3 to 100.4. He is taking tylenol for this and it is managing the fevers.  He is not able to sleep at night.  I recommended trying melatonin. He said he is not taking anything for pain and did not want to, he would continue taking Tylenol. He denies any shortness of breath, edema or problems with his incision site.  He is scheduled for suture removal tomorrow with a nurse and scheduled to see Dr Cyndia Bent next week on 10/14 th.

## 2019-04-03 ENCOUNTER — Encounter: Payer: Self-pay | Admitting: Internal Medicine

## 2019-04-03 DIAGNOSIS — D539 Nutritional anemia, unspecified: Secondary | ICD-10-CM | POA: Insufficient documentation

## 2019-04-06 ENCOUNTER — Other Ambulatory Visit: Payer: Self-pay

## 2019-04-06 DIAGNOSIS — J9311 Primary spontaneous pneumothorax: Secondary | ICD-10-CM

## 2019-04-07 ENCOUNTER — Ambulatory Visit (INDEPENDENT_AMBULATORY_CARE_PROVIDER_SITE_OTHER): Payer: Self-pay | Admitting: Surgery

## 2019-04-07 ENCOUNTER — Ambulatory Visit
Admission: RE | Admit: 2019-04-07 | Discharge: 2019-04-07 | Disposition: A | Discharge status: Home / Self Care | Payer: Medicare Other | Source: Ambulatory Visit | Attending: Surgery | Admitting: Surgery

## 2019-04-07 ENCOUNTER — Encounter: Payer: Self-pay | Admitting: Surgery

## 2019-04-07 ENCOUNTER — Other Ambulatory Visit: Payer: Self-pay

## 2019-04-07 VITALS — BP 120/81 | HR 75 | Temp 98.7°F | Resp 16 | Ht 74.0 in | Wt 239.4 lb

## 2019-04-07 DIAGNOSIS — J9311 Primary spontaneous pneumothorax: Secondary | ICD-10-CM

## 2019-04-07 DIAGNOSIS — Z736 Limitation of activities due to disability: Secondary | ICD-10-CM

## 2019-04-07 DIAGNOSIS — Z09 Encounter for follow-up examination after completed treatment for conditions other than malignant neoplasm: Secondary | ICD-10-CM

## 2019-04-07 DIAGNOSIS — J939 Pneumothorax, unspecified: Secondary | ICD-10-CM | POA: Diagnosis not present

## 2019-04-07 NOTE — Progress Notes (Signed)
HPI: Patient returns for routine postoperative follow-up having undergone left VATS with stapling of apical blebs and mechanical pleurodesis on 03/24/2019. The patient's early postoperative recovery while in the hospital was notable for uncomplicated postoperative course. Since hospital discharge the patient reports that he has been feeling fairly well.  Still has some hallucinations and is not sleeping well.   Current Outpatient Medications  Medication Sig Dispense Refill  . acetaminophen (TYLENOL) 325 MG tablet Take 2 tablets (650 mg total) by mouth every 4 (four) hours as needed. 100 tablet 2  . aspirin EC 81 MG tablet Take 81 mg by mouth daily.    Marland Kitchen escitalopram (LEXAPRO) 20 MG tablet Take 20 mg by mouth daily.  1  . esomeprazole (NEXIUM) 20 MG capsule Take 20 mg by mouth daily at 12 noon.    . metoprolol tartrate (LOPRESSOR) 25 MG tablet TAKE 1 TABLET BY MOUTH THREE TIMES A DAY (Patient taking differently: Take 25 mg by mouth 2 (two) times daily. ) 270 tablet 3  . OXcarbazepine (TRILEPTAL) 150 MG tablet Take 300 mg by mouth daily.     . Potassium 99 MG TABS Take 1 tablet by mouth daily.    . rosuvastatin (CRESTOR) 20 MG tablet TAKE 1 TABLET BY MOUTH EVERY DAY (Patient taking differently: Take 10 mg by mouth daily. ) 90 tablet 1  . Suvorexant (BELSOMRA) 15 MG TABS Take 1 tablet by mouth at bedtime. 30 tablet 1  . vitamin E 100 UNIT capsule Take 100 Units by mouth daily.    Alveda Reasons 10 MG TABS tablet TAKE 1 TABLET BY MOUTH EVERY DAY (Patient taking differently: Take 10 mg by mouth daily with supper. ) 90 tablet 1   No current facility-administered medications for this visit.     Physical Exam: BP 120/81 (BP Location: Right Arm, Patient Position: Sitting, Cuff Size: Normal)   Pulse 75   Temp 98.7 F (37.1 C)   Resp 16   Ht 6\' 2"  (1.88 m)   Wt 239 lb 6.4 oz (108.6 kg)   SpO2 94% Comment: RA  BMI 30.74 kg/m  He looks well. Lungs are clear. The left chest incisions are  healing well and the chest tube suture was removed.  Diagnostic Tests:  CLINICAL DATA:  Recent pneumothorax  EXAM: CHEST - 2 VIEW  COMPARISON:  April 01, 2019  FINDINGS: The previously noted left apical pneumothorax is smaller, with only a slight residual focus of pneumothorax along the lateral left apex. No tension component. There is no appreciable edema or consolidation. The heart size and pulmonary vascularity are normal. Patient is status post coronary artery bypass grafting. There is aortic atherosclerosis. No adenopathy. There is postoperative change in the lower cervical spine. There is degenerative change in the thoracic spine.  IMPRESSION: Left apical pneumothorax is smaller compared to recent prior study with only slight residual pneumothorax focus in the lateral left apex. No tension component.  No edema or consolidation. Stable cardiac silhouette. Postoperative changes noted. Aortic Atherosclerosis (ICD10-I70.0).   Electronically Signed   By: Lowella Grip III M.D.   On: 04/07/2019 09:29   Impression:  Overall he is making good recovery following surgery for spontaneous left pneumothorax.  He had a ruptured left apical bleb and the left apical blebs were resected with a stapler.  He never had any air leak postoperatively.  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  I told him that he can return to normal activity as he feels able.  Plan:  He will return to normal activity as he is able.  He will continue to follow-up with his PCP, Dr. Ronnald Ramp and will return to see me if he has any problems with his incisions.   Gaye Pollack, MD [facility] [phone]

## 2019-04-26 ENCOUNTER — Telehealth: Payer: Self-pay | Admitting: Internal Medicine

## 2019-04-26 NOTE — Telephone Encounter (Signed)
Pt had a left side collapsed lung and wants to know if he should have his flu shot now, FU at [phone] for Dr Ronnald Ramp nurse

## 2019-04-26 NOTE — Telephone Encounter (Addendum)
Is there any reason for pt to wait to receive the flu vaccine?  Recent surgery due to spontaneous pneumothorax

## 2019-04-27 ENCOUNTER — Other Ambulatory Visit: Payer: Self-pay

## 2019-04-27 ENCOUNTER — Ambulatory Visit: Payer: Medicare Other | Admitting: Cardiology

## 2019-04-27 ENCOUNTER — Encounter: Payer: Self-pay | Admitting: Cardiology

## 2019-04-27 VITALS — BP 111/63 | HR 65 | Temp 97.5°F | Ht 74.0 in | Wt 244.0 lb

## 2019-04-27 DIAGNOSIS — E785 Hyperlipidemia, unspecified: Secondary | ICD-10-CM | POA: Diagnosis not present

## 2019-04-27 DIAGNOSIS — Z86718 Personal history of other venous thrombosis and embolism: Secondary | ICD-10-CM

## 2019-04-27 DIAGNOSIS — G500 Trigeminal neuralgia: Secondary | ICD-10-CM

## 2019-04-27 DIAGNOSIS — R0989 Other specified symptoms and signs involving the circulatory and respiratory systems: Secondary | ICD-10-CM | POA: Diagnosis not present

## 2019-04-27 DIAGNOSIS — Z951 Presence of aortocoronary bypass graft: Secondary | ICD-10-CM | POA: Diagnosis not present

## 2019-04-27 DIAGNOSIS — Z9889 Other specified postprocedural states: Secondary | ICD-10-CM

## 2019-04-27 NOTE — Assessment & Plan Note (Signed)
LLE DVT 2019- on chronic Xarelto 10 mg

## 2019-04-27 NOTE — Assessment & Plan Note (Signed)
Status post CABG x 3 in December of 2011.  Myoview low risk 2017

## 2019-04-27 NOTE — Progress Notes (Signed)
Cardiology Office Note:    Date:  04/27/2019   ID:  Willie Black, DOB October 01, 1950, MRN [id number]  PCP:  Janith Lima, MD  Cardiologist:  Dr Martinique Electrophysiologist:  None   Referring MD: Janith Lima, MD   No chief complaint on file.   History of Present Illness:    Willie Black is a 68 y.o. male with a hx of CAD status post CABG 2011 with imatlad,spagetrom,ns.g.pda. nuclear stress test in 2017 was low risk and his ejection fraction was normal.  He last saw Dr. Martinique in 2017.  The patient is here for routine check.  He was seen by Bernerd Pho, [place] in 2018.  In 2019 he developed an acute left lower extremity DVT and was placed on Xarelto by his PCP.  His primary care provider has been following his lipids.  In January 2020 lipid profile showed his LDL to be 125.  He tried Crestor 20 mg but could not tolerate that dose secondary to myalgias and has cut it back to 10 mg.  The patient has a history of trigeminal neuralgia.  He ultimately underwent gamma knife surgery at [facility] March 2020 with some improvement.  The patient was recently admitted with a spontaneous left pneumothorax 03/18/2019.  He underwent left VATS and bleb stapling 03/24/2019 by Dr. Mohammed Kindle.  He was discharged on 03/27/2019.  From a cardiac standpoint he seems to have tolerated all these illnesses and surgeries well.  He denies exertional chest pain although he admits that he "has pain all the time".  Overall he appears to be stable from cardiac standpoint.  Past Medical History:  Diagnosis Date  . Anxiety   . CAD (coronary artery disease)    a. s/p CABG in 2011 with LIMA-LAD, SVG-OM, and SVG-PDA b. low-risk NST in 08/2015  . GERD (gastroesophageal reflux disease)   . Heart attack ([place]) 2011  . Hypercholesteremia   . Pneumothorax 03/18/2019   LEFT SIDE  . PVC (premature ventricular contraction)    Bigeminy PVC's on event monitor Nov 2012  . Renal calculi   . Shingles   . Trigeminal  neuralgia of right side of face     Past Surgical History:  Procedure Laterality Date  . CERVICAL DISC SURGERY    . CORONARY ARTERY BYPASS GRAFT  05/28/2110   x3 with LIMA to LAD, SVG to OM and SVG to LAD after having ST elevation with coming off pump  . KNEE ARTHROSCOPY    . NECK SURGERY    . STAPLING OF BLEBS Left 03/24/2019   Procedure: STAPLING OF BLEBS;  Surgeon: Gaye Pollack, MD;  Location: [facility] OR;  Service: Thoracic;  Laterality: Left;  Marland Kitchen VIDEO ASSISTED THORACOSCOPY Left 03/24/2019   Procedure: VIDEO ASSISTED THORACOSCOPY;  Surgeon: Gaye Pollack, MD;  Location: [facility] OR;  Service: Thoracic;  Laterality: Left;    Current Medications: Current Meds  Medication Sig  . acetaminophen (TYLENOL) 325 MG tablet Take 2 tablets (650 mg total) by mouth every 4 (four) hours as needed.  Marland Kitchen aspirin EC 81 MG tablet Take 81 mg by mouth daily.  . Cholecalciferol (VITAMIN D3 PO) Take 1 tablet by mouth daily.  . Cyanocobalamin (VITAMIN B-12 PO) Take 1 capsule by mouth daily.  Marland Kitchen escitalopram (LEXAPRO) 20 MG tablet Take 20 mg by mouth daily.  Marland Kitchen esomeprazole (NEXIUM) 20 MG capsule Take 20 mg by mouth daily at 12 noon.  . metoprolol tartrate (LOPRESSOR) 25 MG tablet Take 25 mg by  mouth 2 (two) times daily.  . OXcarbazepine (TRILEPTAL) 150 MG tablet Take 300 mg by mouth daily.   . Potassium 99 MG TABS Take 1 tablet by mouth daily.  . rivaroxaban (XARELTO) 10 MG TABS tablet Take 10 mg by mouth daily.  . rosuvastatin (CRESTOR) 20 MG tablet Take 10 mg by mouth daily.  . Suvorexant (BELSOMRA) 15 MG TABS Take 1 tablet by mouth at bedtime.  . vitamin E 100 UNIT capsule Take 100 Units by mouth daily.     Allergies:   Penicillins   Social History   Socioeconomic History  . Marital status: Married    Spouse name: Not on file  . Number of children: 2  . Years of education: 61  . Highest education level: Not on file  Occupational History  . Occupation: Scientist, clinical (histocompatibility and immunogenetics): [employer]  . Financial resource strain: Not on file  . Food insecurity    Worry: Not on file    Inability: Not on file  . Transportation needs    Medical: Not on file    Non-medical: Not on file  Tobacco Use  . Smoking status: Former Smoker    Types: Cigarettes    Quit date: 04/27/2009    Years since quitting: 10.0  . Smokeless tobacco: Never Used  Substance and Sexual Activity  . Alcohol use: No  . Drug use: No  . Sexual activity: Yes  Lifestyle  . Physical activity    Days per week: Not on file    Minutes per session: Not on file  . Stress: Not on file  Relationships  . Social Herbalist on phone: Not on file    Gets together: Not on file    Attends religious service: Not on file    Active member of club or organization: Not on file    Attends meetings of clubs or organizations: Not on file    Relationship status: Not on file  Other Topics Concern  . Not on file  Social History Narrative   Lives at home with his wife.   Right-handed.   2 cups caffeine daily.     Family History: The patient's family history includes Aneurysm in his father; Coronary artery disease in his father; Heart failure in his mother.  ROS:   Please see the history of present illness.     All other systems reviewed and are negative.  EKGs/Labs/Other Studies Reviewed:    The following studies were reviewed today: Myoview 2017  EKG:  EKG is not ordered today.  The ekg ordered 03/18/2019 demonstrates NSR, HR 60.   Recent Labs: 07/15/2018: ALT 17; TSH 0.98 03/26/2019: BUN 20; Creatinine, Ser 1.07; Hemoglobin 12.0; Platelets PLATELET CLUMPS NOTED ON SMEAR, UNABLE TO ESTIMATE; Potassium 4.2; Sodium 134  Recent Lipid Panel    Component Value Date/Time   CHOL 213 (H) 07/15/2018 1527   TRIG 162.0 (H) 07/15/2018 1527   HDL 55.20 07/15/2018 1527   CHOLHDL 4 07/15/2018 1527   VLDL 32.4 07/15/2018 1527   LDLCALC 125 (H) 07/15/2018 1527    Physical Exam:    VS:  BP 111/63   Pulse 65    Temp (!) 97.5 F (36.4 C)   Ht 6\' 2"  (1.88 m)   Wt 244 lb (110.7 kg)   SpO2 96%   BMI 31.33 kg/m     Wt Readings from Last 3 Encounters:  04/27/19 244 lb (110.7 kg)  04/07/19 239 lb  6.4 oz (108.6 kg)  03/18/19 250 lb (113.4 kg)     GEN:  Well nourished, well developed in no acute distress HEENT: Normal NECK: No JVD; RCA bruit LYMPHATICS: No lymphadenopathy CARDIAC: RRR, no murmurs, rubs, gallops RESPIRATORY:  Clear to auscultation without rales, wheezing or rhonchi  ABDOMEN: Soft, non-tender, non-distended MUSCULOSKELETAL:  No edema; No deformity  SKIN: Warm and dry NEUROLOGIC:  Alert and oriented x 3 PSYCHIATRIC:  Normal affect   ASSESSMENT:    Hx of CABG Status post CABG x 3 in December of 2011.  Myoview low risk 2017  Hyperlipidemia LDL goal <70 LDL 125 [name] 2020- the patient was unable to tolerate increased statin Rx secondary to myalgia.  Consider PCSK9- will discuss with his primary cardiologist  Right carotid bruit Check dopplers  S/P thoracotomy 03/24/2019- secondary to ruptured bleb- treated with VATs and stapling.  Trigeminal neuralgia of right side of face S/P Gamma knife surgery at [facility]-09/22/2018   History of DVT (deep vein thrombosis) LLE DVT 2019- on chronic Xarelto 10 mg  PLAN:    Check carotid dopplers.  F/U Dr Martinique in 6 months- consider addition of PCSK9 to low dose statin Rx.    Medication Adjustments/Labs and Tests Ordered: Current medicines are reviewed at length with the patient today.  Concerns regarding medicines are outlined above.  Orders Placed This Encounter  Procedures  . VAS US CAROTID   No orders of the defined types were placed in this encounter.   Patient Instructions  Medication Instructions:  Your physician recommends that you continue on your current medications as directed. Please refer to the Current Medication list given to you today. *If you need a refill on your cardiac medications before your next appointment,  please call your pharmacy*  Lab Work: None  If you have labs (blood work) drawn today and your tests are completely normal, you will receive your results only by: Marland Kitchen MyChart Message (if you have MyChart) OR . A paper copy in the mail If you have any lab test that is abnormal or we need to change your treatment, we will call you to review the results.  Testing/Procedures: Your physician has requested that you have a carotid duplex. This test is an ultrasound of the carotid arteries in your neck. It looks at blood flow through these arteries that supply the brain with blood. Allow one hour for this exam. There are no restrictions or special instructions.  Follow-Up: At [facility], you and your health needs are our priority.  As part of our continuing mission to provide you with exceptional heart care, we have created designated Provider Care Teams.  These Care Teams include your primary Cardiologist (physician) and Advanced Practice Providers (APPs -  Physician Assistants and Nurse Practitioners) who all work together to provide you with the care you need, when you need it.  Your next appointment:   6 months  The format for your next appointment:   In Person  Provider:   Peter Martinique, MD  Other Instructions     Signed, Kerin Ransom, PA-C  04/27/2019 10:54 AM    [employer]

## 2019-04-27 NOTE — Patient Instructions (Signed)
Medication Instructions:  Your physician recommends that you continue on your current medications as directed. Please refer to the Current Medication list given to you today. *If you need a refill on your cardiac medications before your next appointment, please call your pharmacy*  Lab Work: None  If you have labs (blood work) drawn today and your tests are completely normal, you will receive your results only by: Marland Kitchen MyChart Message (if you have MyChart) OR . A paper copy in the mail If you have any lab test that is abnormal or we need to change your treatment, we will call you to review the results.  Testing/Procedures: Your physician has requested that you have a carotid duplex. This test is an ultrasound of the carotid arteries in your neck. It looks at blood flow through these arteries that supply the brain with blood. Allow one hour for this exam. There are no restrictions or special instructions.  Follow-Up: At [facility], you and your health needs are our priority.  As part of our continuing mission to provide you with exceptional heart care, we have created designated Provider Care Teams.  These Care Teams include your primary Cardiologist (physician) and Advanced Practice Providers (APPs -  Physician Assistants and Nurse Practitioners) who all work together to provide you with the care you need, when you need it.  Your next appointment:   6 months  The format for your next appointment:   In Person  Provider:   Peter Martinique, MD  Other Instructions

## 2019-04-27 NOTE — Progress Notes (Signed)
Luke - yes he is a candidate, just have your RN put him on our schedule

## 2019-04-27 NOTE — Assessment & Plan Note (Signed)
S/P Gamma knife surgery at [facility]-09/22/2018

## 2019-04-27 NOTE — Assessment & Plan Note (Signed)
03/24/2019- secondary to ruptured bleb- treated with VATs and stapling.

## 2019-04-27 NOTE — Telephone Encounter (Signed)
No, there is no reason he can't get a flu vaccine  TJ

## 2019-04-27 NOTE — Assessment & Plan Note (Signed)
LDL 125 [name] 2020- the patient was unable to tolerate increased statin Rx secondary to myalgia.  Consider PCSK9- will discuss with his primary cardiologist

## 2019-04-27 NOTE — Assessment & Plan Note (Signed)
Check dopplers 

## 2019-04-28 MED ORDER — ZOSTER VAC RECOMB ADJUVANTED 50 MCG/0.5ML IM SUSR: 0.5000 mL | Freq: Once | INTRAMUSCULAR | 1 refills | Status: AC

## 2019-04-28 MED ORDER — PNEUMOVAX 23 25 MCG/0.5ML IJ INJ: 0.5000 mL | INJECTION | Freq: Once | INTRAMUSCULAR | 0 refills | Status: AC

## 2019-04-28 MED ORDER — INFLUENZA VAC A&B SA ADJ QUAD 0.5 ML IM PRSY: 0.5000 mL | PREFILLED_SYRINGE | Freq: Once | INTRAMUSCULAR | 0 refills | Status: AC

## 2019-04-28 NOTE — Telephone Encounter (Signed)
Contacted pt and informed of same. I have sent the flu, pneumovax and shingles to the pharmacy to be administered.

## 2019-04-30 DIAGNOSIS — G500 Trigeminal neuralgia: Secondary | ICD-10-CM | POA: Diagnosis not present

## 2019-04-30 DIAGNOSIS — M47816 Spondylosis without myelopathy or radiculopathy, lumbar region: Secondary | ICD-10-CM | POA: Diagnosis not present

## 2019-04-30 DIAGNOSIS — G894 Chronic pain syndrome: Secondary | ICD-10-CM | POA: Diagnosis not present

## 2019-05-03 ENCOUNTER — Ambulatory Visit (HOSPITAL_COMMUNITY)
Admission: RE | Admit: 2019-05-03 | Discharge: 2019-05-03 | Disposition: A | Discharge status: Home / Self Care | Payer: Medicare Other | Source: Ambulatory Visit | Attending: Cardiology | Admitting: Cardiology

## 2019-05-03 ENCOUNTER — Other Ambulatory Visit: Payer: Self-pay

## 2019-05-03 DIAGNOSIS — R0989 Other specified symptoms and signs involving the circulatory and respiratory systems: Secondary | ICD-10-CM | POA: Diagnosis not present

## 2019-05-05 ENCOUNTER — Telehealth: Payer: Self-pay | Admitting: Cardiology

## 2019-05-05 NOTE — Telephone Encounter (Signed)
I called the patient to discuss his carotid Doppler report and the incidental finding of right subclavian artery stenosis.  I asked Dr. Gwenlyn Found to review this study.  He felt if the patient was not having symptoms from his right subclavian stenosis that no further work-up was needed at this time.  The patient denies having any pain in his arm with use.  He has a follow-up with Dr. Martinique in 6 months and will keep that.  Kerin Ransom PA-C 05/05/2019 2:57 PM

## 2019-05-09 ENCOUNTER — Other Ambulatory Visit: Payer: Self-pay | Admitting: Internal Medicine

## 2019-05-09 DIAGNOSIS — D6859 Other primary thrombophilia: Secondary | ICD-10-CM

## 2019-05-14 ENCOUNTER — Other Ambulatory Visit: Payer: Self-pay

## 2019-05-14 ENCOUNTER — Ambulatory Visit
Admission: RE | Admit: 2019-05-14 | Discharge: 2019-05-14 | Disposition: A | Discharge status: Home / Self Care | Payer: Medicare Other | Source: Ambulatory Visit | Attending: Surgery | Admitting: Surgery

## 2019-05-14 ENCOUNTER — Telehealth: Payer: Self-pay

## 2019-05-14 DIAGNOSIS — J9311 Primary spontaneous pneumothorax: Secondary | ICD-10-CM

## 2019-05-14 DIAGNOSIS — Z95818 Presence of other cardiac implants and grafts: Secondary | ICD-10-CM | POA: Diagnosis not present

## 2019-05-14 NOTE — Telephone Encounter (Signed)
Pt called the office this AM to report the onset of a strange sensation in his left lung yesterday. S/P L VATS 03/24/19. He states, "I feel like there is something moving deep inside my lung every time I breathe in." He denies pain, SOB, s/s of infection. Josie Saunders, PA contacted and ordered CXR, which was found to be normal. D. Tacy Dura aware and states pt can take ibuprofen if he has no hx of bleeding problems. Notified pt of the above. Encouraged him to see if there is any improvement over the next few days. Advised him to call back if he continues to be bothered by this issue or if he develops pain, SOB, or other problems.

## 2019-05-14 NOTE — Telephone Encounter (Signed)
Pt called office this AM to report the onset of a strange sensation in his

## 2019-06-14 ENCOUNTER — Other Ambulatory Visit: Payer: Self-pay

## 2019-06-14 MED ORDER — METOPROLOL TARTRATE 25 MG PO TABS: 25.0000 mg | ORAL_TABLET | Freq: Two times a day (BID) | ORAL | 3 refills | Status: DC

## 2019-06-28 DIAGNOSIS — Z1159 Encounter for screening for other viral diseases: Secondary | ICD-10-CM | POA: Diagnosis not present

## 2019-06-28 DIAGNOSIS — U071 COVID-19: Secondary | ICD-10-CM | POA: Diagnosis not present

## 2019-07-23 DIAGNOSIS — G894 Chronic pain syndrome: Secondary | ICD-10-CM | POA: Diagnosis not present

## 2019-07-23 DIAGNOSIS — G500 Trigeminal neuralgia: Secondary | ICD-10-CM | POA: Diagnosis not present

## 2019-07-23 DIAGNOSIS — M47816 Spondylosis without myelopathy or radiculopathy, lumbar region: Secondary | ICD-10-CM | POA: Diagnosis not present

## 2019-07-23 DIAGNOSIS — G580 Intercostal neuropathy: Secondary | ICD-10-CM | POA: Diagnosis not present

## 2019-08-18 ENCOUNTER — Encounter: Payer: Self-pay | Admitting: Internal Medicine

## 2019-08-20 NOTE — Progress Notes (Signed)
Order(s) created erroneously. Erroneous order ID: [id number]  Order moved by: Berneice Gandy  Order move date/time: 08/20/2019 3:06 PM  Source Patient: [id number]  Source Contact: 08/18/2019  Destination Patient: [id number]  Destination Contact: 09/08/2012

## 2019-09-15 ENCOUNTER — Telehealth: Payer: Self-pay | Admitting: Internal Medicine

## 2019-09-15 ENCOUNTER — Other Ambulatory Visit: Payer: Self-pay | Admitting: Internal Medicine

## 2019-09-15 DIAGNOSIS — I714 Abdominal aortic aneurysm, without rupture, unspecified: Secondary | ICD-10-CM

## 2019-09-15 NOTE — Telephone Encounter (Signed)
-----   Message from Janith Lima, MD sent at 09/08/2017  8:51 AM EDT ----- Regarding: U/S Recheck AAA

## 2019-09-16 ENCOUNTER — Other Ambulatory Visit: Payer: Self-pay | Admitting: Internal Medicine

## 2019-09-16 DIAGNOSIS — I714 Abdominal aortic aneurysm, without rupture, unspecified: Secondary | ICD-10-CM

## 2019-10-01 ENCOUNTER — Other Ambulatory Visit: Payer: Self-pay

## 2019-10-01 ENCOUNTER — Ambulatory Visit (HOSPITAL_COMMUNITY)
Admission: RE | Admit: 2019-10-01 | Discharge: 2019-10-01 | Disposition: A | Discharge status: Home / Self Care | Payer: Medicare Other | Source: Ambulatory Visit | Attending: Cardiovascular Disease | Admitting: Cardiovascular Disease

## 2019-10-01 ENCOUNTER — Other Ambulatory Visit (HOSPITAL_COMMUNITY): Payer: Self-pay | Admitting: Internal Medicine

## 2019-10-01 DIAGNOSIS — I714 Abdominal aortic aneurysm, without rupture, unspecified: Secondary | ICD-10-CM

## 2019-10-02 ENCOUNTER — Encounter: Payer: Self-pay | Admitting: Internal Medicine

## 2019-10-22 NOTE — Progress Notes (Signed)
Cardiology Office Note    Date:  10/26/2019   ID:  Willie Black, DOB [birth year], MRN [id number]  PCP:  Janith Lima, MD  Cardiologist: Dr. Martinique   Chief Complaint  Patient presents with  . Coronary Artery Disease    History of Present Illness:    Willie Black is a 69 y.o. male with past medical history of CAD (s/p CABG in 2011 with LIMA-LAD, SVG-OM, and SVG-PDA), HTN, HLD, and PVC's who presents to the office today for annual follow-up.  He had been intolerant to multiple statins in the past. Nuclear stress test in 2017 was low risk and his ejection fraction was normal.    In 2019 he developed an acute left lower extremity DVT and was placed on Xarelto by his PCP.  His primary care provider has been following his lipids.  In January 2020 lipid profile showed his LDL to be 125.  He tried Crestor 20 mg but could not tolerate that dose secondary to myalgias and has cut it back to 10 mg.  The patient has a history of trigeminal neuralgia.  He ultimately underwent gamma knife surgery at [facility] March 2020 with some improvement.  The patient was  admitted with a spontaneous left pneumothorax 03/18/2019.  He underwent left VATS and bleb stapling 03/24/2019 by Dr. Cyndia Bent.  He was discharged on 03/27/2019. He was noted on carotid dopplers to have right subclavian stenosis. This was discussed with Dr Gwenlyn Found who felt if the patient was asymptomatic then continue to follow medically. Abdominal US in April 2021 showed stable AAA at 3.1 cm.  He reports he is doing well. Has lost 12 lbs in the last month. Increasing his activity. No chest pain, dyspnea, palpitations, dizziness. Tolerating Crestor 10 mg daily. No right arm numbness. Has numbness right face, tongue, teeth from prior gamma knife procedure.  Past Medical History:  Diagnosis Date  . Anxiety   . CAD (coronary artery disease)    a. s/p CABG in 2011 with LIMA-LAD, SVG-OM, and SVG-PDA b. low-risk NST in 08/2015  . GERD  (gastroesophageal reflux disease)   . Heart attack ([place]) 2011  . Hypercholesteremia   . Pneumothorax 03/18/2019   LEFT SIDE  . PVC (premature ventricular contraction)    Bigeminy PVC's on event monitor Nov 2012  . Renal calculi   . Shingles   . Trigeminal neuralgia of right side of face     Past Surgical History:  Procedure Laterality Date  . CERVICAL DISC SURGERY    . CORONARY ARTERY BYPASS GRAFT  05/28/2110   x3 with LIMA to LAD, SVG to OM and SVG to LAD after having ST elevation with coming off pump  . KNEE ARTHROSCOPY    . NECK SURGERY    . STAPLING OF BLEBS Left 03/24/2019   Procedure: STAPLING OF BLEBS;  Surgeon: Gaye Pollack, MD;  Location: [place];  Service: Thoracic;  Laterality: Left;  Marland Kitchen VIDEO ASSISTED THORACOSCOPY Left 03/24/2019   Procedure: VIDEO ASSISTED THORACOSCOPY;  Surgeon: Gaye Pollack, MD;  Location: [facility] OR;  Service: Thoracic;  Laterality: Left;    Current Medications: Outpatient Medications Prior to Visit  Medication Sig Dispense Refill  . acetaminophen (TYLENOL) 325 MG tablet Take 2 tablets (650 mg total) by mouth every 4 (four) hours as needed. 100 tablet 2  . aspirin EC 81 MG tablet Take 81 mg by mouth daily.    . Cholecalciferol (VITAMIN D3 PO) Take 1 tablet by mouth daily.    Marland Kitchen  Cyanocobalamin (VITAMIN B-12 PO) Take 1 capsule by mouth daily.    Marland Kitchen escitalopram (LEXAPRO) 20 MG tablet Take 20 mg by mouth daily.  1  . esomeprazole (NEXIUM) 20 MG capsule Take 20 mg by mouth daily at 12 noon.    . metoprolol tartrate (LOPRESSOR) 25 MG tablet Take 1 tablet (25 mg total) by mouth 2 (two) times daily. 270 tablet 3  . OXcarbazepine (TRILEPTAL) 150 MG tablet Take 300 mg by mouth daily.     . Potassium 99 MG TABS Take 1 tablet by mouth daily.    . rosuvastatin (CRESTOR) 20 MG tablet Take 10 mg by mouth daily.    . Suvorexant (BELSOMRA) 15 MG TABS Take 1 tablet by mouth at bedtime. 30 tablet 1  . vitamin E 100 UNIT capsule Take 100 Units by mouth daily.    Alveda Reasons 10 MG TABS tablet TAKE 1 TABLET BY MOUTH EVERY DAY 90 tablet 1   No facility-administered medications prior to visit.     Allergies:   Penicillins   Social History   Socioeconomic History  . Marital status: Married    Spouse name: Not on file  . Number of children: 2  . Years of education: 66  . Highest education level: Not on file  Occupational History  . Occupation: Scientist, clinical (histocompatibility and immunogenetics): [employer]  Tobacco Use  . Smoking status: Former Smoker    Types: Cigarettes    Quit date: 04/27/2009    Years since quitting: 10.5  . Smokeless tobacco: Never Used  Substance and Sexual Activity  . Alcohol use: No  . Drug use: No  . Sexual activity: Yes  Other Topics Concern  . Not on file  Social History Narrative   Lives at home with his wife.   Right-handed.   2 cups caffeine daily.   Social Determinants of Health   Financial Resource Strain:   . Difficulty of Paying Living Expenses:   Food Insecurity:   . Worried About Charity fundraiser in the Last Year:   . Arboriculturist in the Last Year:   Transportation Needs:   . Film/video editor (Medical):   Marland Kitchen Lack of Transportation (Non-Medical):   Physical Activity:   . Days of Exercise per Week:   . Minutes of Exercise per Session:   Stress:   . Feeling of Stress :   Social Connections:   . Frequency of Communication with Friends and Family:   . Frequency of Social Gatherings with Friends and Family:   . Attends Religious Services:   . Active Member of Clubs or Organizations:   . Attends Archivist Meetings:   Marland Kitchen Marital Status:      Family History:  The patient's family history includes Aneurysm in his father; Coronary artery disease in his father; Heart failure in his mother.   Review of Systems:   Please see the history of present illness.    All other systems reviewed and are otherwise negative except as noted above.   Physical Exam:    VS:  BP 126/66   Pulse (!) 59   Ht 6\' 2"   (1.88 m)   Wt 253 lb (114.8 kg)   SpO2 96%   BMI 32.48 kg/m    BP 140/80 on left, 126/76 on right  General: Well developed, well nourished Caucasian male appearing in no acute distress. Head: Normocephalic, atraumatic, sclera non-icteric, no xanthomas, nares are without discharge.  Neck: No carotid  bruits. JVD not elevated. Right subclavian bruit. Lungs: Respirations regular and unlabored, without wheezes or rales.  Heart: Regular rate and rhythm. No S3 or S4.  No murmur, no rubs, or gallops appreciated. Abdomen: Soft, non-tender, non-distended with normoactive bowel sounds. No hepatomegaly. No rebound/guarding. No obvious abdominal masses. Msk:  Strength and tone appear normal for age. No joint deformities or effusions. Extremities: No clubbing or cyanosis. No lower extremity edema.  Distal pedal pulses are 2+ bilaterally. Neuro: Alert and oriented X 3. Moves all extremities spontaneously. No focal deficits noted. Psych:  Responds to questions appropriately with a normal affect. Skin: No rashes or lesions noted  Wt Readings from Last 3 Encounters:  10/26/19 253 lb (114.8 kg)  04/27/19 244 lb (110.7 kg)  04/07/19 239 lb 6.4 oz (108.6 kg)     Studies/Labs Reviewed:   EKG:  EKG is ordered today.  The ekg ordered today demonstrates sinus bradycardia, HR 55, with incomplete RBBB.   Recent Labs: 03/26/2019: BUN 20; Creatinine, Ser 1.07; Hemoglobin 12.0; Platelets PLATELET CLUMPS NOTED ON SMEAR, UNABLE TO ESTIMATE; Potassium 4.2; Sodium 134   Lipid Panel    Component Value Date/Time   CHOL 213 (H) 07/15/2018 1527   TRIG 162.0 (H) 07/15/2018 1527   HDL 55.20 07/15/2018 1527   CHOLHDL 4 07/15/2018 1527   VLDL 32.4 07/15/2018 1527   LDLCALC 125 (H) 07/15/2018 1527    Additional studies/ records that were reviewed today include:   NST: 09/06/2015  The left ventricular ejection fraction is normal (55-65%).  Nuclear stress EF: 63%.  Blood pressure demonstrated a hypertensive  response to exercise.  Upsloping ST segment depression ST segment depression was noted during stress in the II, III and aVF leads.  The study is normal.  This is a low risk study.  Carotid dopplers 05/23/19: Summary:  Right Carotid: Velocities in the right ICA are consistent with a 1-39%  stenosis.         Non-hemodynamically significant plaque <50% noted in the  CCA.   Left Carotid: Velocities in the left ICA are consistent with a 1-39%  stenosis.   Vertebrals: Left vertebral artery demonstrates antegrade flow. Abnormal  and        antegrade right vertebral artery waveform indicates a more  proximal        stenosis.  Subclavians: Right subclavian artery was stenotic. Normal flow  hemodynamics were        seen in the left subclavian artery. Early signs of right  subclavian        steal syndrome.   *See table(s) above for measurements and observations.  Suggest follow up study in If clinically indicated.    Electronically signed by Carlyle Dolly MD on 05/04/2019 at 11:41:30 AM.     Assessment:    1. Coronary artery disease involving coronary bypass graft of native heart without angina pectoris   2. Hx of CABG   3. Bruit of right carotid artery   4. Hyperlipidemia LDL goal <70   5. AAA (abdominal aortic aneurysm) without rupture ([place])      Plan:   In order of problems listed above:  1. CAD - s/p CABG in 2011 with LIMA-LAD, SVG-OM, and SVG-PDA. Recent NST in 08/2015 was low-risk.  - he denies any recent chest pain or dyspnea on exertion.  - continue ASA and Lopressor.  2. HLD - tolerating Crestor 10 mg daily. Will check chemistries and lipid panel today.  3. HTN - BP is well-controlled. Need to check  BP in left arm  4. PVC's - prior event monitor showed bigeminal PVC's. - he denies any recent palpitations.  - continue Lopressor   5. Right subclavian stenosis. Mild and asymptomatic.  6. AAA 3.1 cm by [place] on 10/01/19.  follow  Yearly.   Follow up in one year.     Signed, Peter Martinique, MD  10/26/2019 8:41 AM    [facility] [address] [place], [place] [address] Phone: [phone]

## 2019-10-26 ENCOUNTER — Encounter: Payer: Self-pay | Admitting: Cardiology

## 2019-10-26 ENCOUNTER — Other Ambulatory Visit: Payer: Self-pay

## 2019-10-26 ENCOUNTER — Ambulatory Visit: Payer: Medicare Other | Admitting: Cardiology

## 2019-10-26 VITALS — BP 126/66 | HR 59 | Ht 74.0 in | Wt 253.0 lb

## 2019-10-26 DIAGNOSIS — I714 Abdominal aortic aneurysm, without rupture, unspecified: Secondary | ICD-10-CM

## 2019-10-26 DIAGNOSIS — Z951 Presence of aortocoronary bypass graft: Secondary | ICD-10-CM

## 2019-10-26 DIAGNOSIS — E785 Hyperlipidemia, unspecified: Secondary | ICD-10-CM

## 2019-10-26 DIAGNOSIS — I25810 Atherosclerosis of coronary artery bypass graft(s) without angina pectoris: Secondary | ICD-10-CM

## 2019-10-26 DIAGNOSIS — R0989 Other specified symptoms and signs involving the circulatory and respiratory systems: Secondary | ICD-10-CM

## 2019-10-26 LAB — BASIC METABOLIC PANEL
BUN/Creatinine Ratio: 13 (ref 10–24)
BUN: 13 mg/dL (ref 8–27)
CO2: 25 mmol/L (ref 20–29)
Calcium: 9.2 mg/dL (ref 8.6–10.2)
Chloride: 101 mmol/L (ref 96–106)
Creatinine, Ser: 1.01 mg/dL (ref 0.76–1.27)
GFR calc Af Amer: 89 mL/min/{1.73_m2} (ref >59)
GFR calc non Af Amer: 77 mL/min/{1.73_m2} (ref >59)
Glucose: 93 mg/dL (ref 65–99)
Potassium: 4.7 mmol/L (ref 3.5–5.2)
Sodium: 138 mmol/L (ref 134–144)

## 2019-10-26 LAB — HEPATIC FUNCTION PANEL
ALT: 12 IU/L (ref 0–44)
AST: 17 IU/L (ref 0–40)
Albumin: 4.2 g/dL (ref 3.8–4.8)
Alkaline Phosphatase: 112 IU/L (ref 39–117)
Bilirubin Total: 0.4 mg/dL (ref 0.0–1.2)
Bilirubin, Direct: 0.12 mg/dL (ref 0.00–0.40)
Total Protein: 7.3 g/dL (ref 6.0–8.5)

## 2019-10-26 LAB — LIPID PANEL
Chol/HDL Ratio: 2.8 ratio (ref 0.0–5.0)
Cholesterol, Total: 134 mg/dL (ref 100–199)
HDL: 48 mg/dL (ref >39)
LDL Chol Calc (NIH): 74 mg/dL (ref 0–99)
Triglycerides: 58 mg/dL (ref 0–149)
VLDL Cholesterol Cal: 12 mg/dL (ref 5–40)

## 2019-10-31 ENCOUNTER — Other Ambulatory Visit: Payer: Self-pay | Admitting: Internal Medicine

## 2019-10-31 DIAGNOSIS — D6859 Other primary thrombophilia: Secondary | ICD-10-CM

## 2019-11-29 ENCOUNTER — Telehealth: Payer: Self-pay | Admitting: Internal Medicine

## 2019-11-29 NOTE — Telephone Encounter (Signed)
errro

## 2019-12-08 LAB — HM COLONOSCOPY

## 2020-01-27 DIAGNOSIS — G894 Chronic pain syndrome: Secondary | ICD-10-CM | POA: Diagnosis not present

## 2020-01-27 DIAGNOSIS — G500 Trigeminal neuralgia: Secondary | ICD-10-CM | POA: Diagnosis not present

## 2020-01-27 DIAGNOSIS — M47816 Spondylosis without myelopathy or radiculopathy, lumbar region: Secondary | ICD-10-CM | POA: Diagnosis not present

## 2020-01-27 DIAGNOSIS — G580 Intercostal neuropathy: Secondary | ICD-10-CM | POA: Diagnosis not present

## 2020-01-29 ENCOUNTER — Other Ambulatory Visit: Payer: Self-pay | Admitting: Family

## 2020-01-29 DIAGNOSIS — D6859 Other primary thrombophilia: Secondary | ICD-10-CM

## 2020-04-29 ENCOUNTER — Other Ambulatory Visit: Payer: Self-pay | Admitting: Internal Medicine

## 2020-04-29 DIAGNOSIS — D6859 Other primary thrombophilia: Secondary | ICD-10-CM

## 2020-05-05 ENCOUNTER — Other Ambulatory Visit: Payer: Self-pay | Admitting: Internal Medicine

## 2020-05-05 DIAGNOSIS — D6859 Other primary thrombophilia: Secondary | ICD-10-CM

## 2020-05-09 ENCOUNTER — Other Ambulatory Visit: Payer: Self-pay | Admitting: Internal Medicine

## 2020-05-09 DIAGNOSIS — L821 Other seborrheic keratosis: Secondary | ICD-10-CM | POA: Diagnosis not present

## 2020-05-09 DIAGNOSIS — L814 Other melanin hyperpigmentation: Secondary | ICD-10-CM | POA: Diagnosis not present

## 2020-05-09 DIAGNOSIS — L570 Actinic keratosis: Secondary | ICD-10-CM | POA: Diagnosis not present

## 2020-05-09 DIAGNOSIS — C44319 Basal cell carcinoma of skin of other parts of face: Secondary | ICD-10-CM | POA: Diagnosis not present

## 2020-05-09 DIAGNOSIS — C44519 Basal cell carcinoma of skin of other part of trunk: Secondary | ICD-10-CM | POA: Diagnosis not present

## 2020-05-09 DIAGNOSIS — Z85828 Personal history of other malignant neoplasm of skin: Secondary | ICD-10-CM | POA: Diagnosis not present

## 2020-05-09 DIAGNOSIS — C44329 Squamous cell carcinoma of skin of other parts of face: Secondary | ICD-10-CM | POA: Diagnosis not present

## 2020-05-09 DIAGNOSIS — L820 Inflamed seborrheic keratosis: Secondary | ICD-10-CM | POA: Diagnosis not present

## 2020-05-09 DIAGNOSIS — L905 Scar conditions and fibrosis of skin: Secondary | ICD-10-CM | POA: Diagnosis not present

## 2020-05-09 DIAGNOSIS — D225 Melanocytic nevi of trunk: Secondary | ICD-10-CM | POA: Diagnosis not present

## 2020-05-09 DIAGNOSIS — D485 Neoplasm of uncertain behavior of skin: Secondary | ICD-10-CM | POA: Diagnosis not present

## 2020-05-09 DIAGNOSIS — D6859 Other primary thrombophilia: Secondary | ICD-10-CM

## 2020-05-10 ENCOUNTER — Encounter: Payer: Self-pay | Admitting: Internal Medicine

## 2020-05-10 ENCOUNTER — Telehealth: Payer: Self-pay | Admitting: Internal Medicine

## 2020-05-10 ENCOUNTER — Ambulatory Visit (INDEPENDENT_AMBULATORY_CARE_PROVIDER_SITE_OTHER): Payer: Medicare Other | Admitting: Internal Medicine

## 2020-05-10 ENCOUNTER — Other Ambulatory Visit: Payer: Self-pay

## 2020-05-10 VITALS — BP 126/80 | HR 64 | Temp 98.3°F | Ht 74.0 in | Wt 255.0 lb

## 2020-05-10 DIAGNOSIS — I10 Essential (primary) hypertension: Secondary | ICD-10-CM | POA: Diagnosis not present

## 2020-05-10 DIAGNOSIS — H903 Sensorineural hearing loss, bilateral: Secondary | ICD-10-CM | POA: Diagnosis not present

## 2020-05-10 DIAGNOSIS — Z23 Encounter for immunization: Secondary | ICD-10-CM

## 2020-05-10 DIAGNOSIS — D539 Nutritional anemia, unspecified: Secondary | ICD-10-CM | POA: Diagnosis not present

## 2020-05-10 DIAGNOSIS — I779 Disorder of arteries and arterioles, unspecified: Secondary | ICD-10-CM

## 2020-05-10 DIAGNOSIS — N400 Enlarged prostate without lower urinary tract symptoms: Secondary | ICD-10-CM

## 2020-05-10 DIAGNOSIS — I2510 Atherosclerotic heart disease of native coronary artery without angina pectoris: Secondary | ICD-10-CM

## 2020-05-10 DIAGNOSIS — Z0000 Encounter for general adult medical examination without abnormal findings: Secondary | ICD-10-CM

## 2020-05-10 DIAGNOSIS — D6859 Other primary thrombophilia: Secondary | ICD-10-CM | POA: Diagnosis not present

## 2020-05-10 DIAGNOSIS — N521 Erectile dysfunction due to diseases classified elsewhere: Secondary | ICD-10-CM

## 2020-05-10 DIAGNOSIS — Z1211 Encounter for screening for malignant neoplasm of colon: Secondary | ICD-10-CM

## 2020-05-10 DIAGNOSIS — D508 Other iron deficiency anemias: Secondary | ICD-10-CM

## 2020-05-10 DIAGNOSIS — E785 Hyperlipidemia, unspecified: Secondary | ICD-10-CM

## 2020-05-10 LAB — CBC WITH DIFFERENTIAL/PLATELET
Basophils Absolute: 0.1 10*3/uL (ref 0.0–0.1)
Basophils Relative: 1.1 % (ref 0.0–3.0)
Eosinophils Absolute: 0.1 10*3/uL (ref 0.0–0.7)
Eosinophils Relative: 2 % (ref 0.0–5.0)
HCT: 38.4 % — ABNORMAL LOW (ref 39.0–52.0)
Hemoglobin: 12.5 g/dL — ABNORMAL LOW (ref 13.0–17.0)
Lymphocytes Relative: 26.6 % (ref 12.0–46.0)
Lymphs Abs: 1.4 10*3/uL (ref 0.7–4.0)
MCHC: 32.4 g/dL (ref 30.0–36.0)
MCV: 89 fl (ref 78.0–100.0)
Monocytes Absolute: 0.7 10*3/uL (ref 0.1–1.0)
Monocytes Relative: 13.3 % — ABNORMAL HIGH (ref 3.0–12.0)
Neutro Abs: 3 10*3/uL (ref 1.4–7.7)
Neutrophils Relative %: 57 % (ref 43.0–77.0)
Platelets: 216 10*3/uL (ref 150.0–400.0)
RBC: 4.32 Mil/uL (ref 4.22–5.81)
RDW: 15.9 % — ABNORMAL HIGH (ref 11.5–15.5)
WBC: 5.2 10*3/uL (ref 4.0–10.5)

## 2020-05-10 LAB — BASIC METABOLIC PANEL
BUN: 16 mg/dL (ref 6–23)
CO2: 32 mEq/L (ref 19–32)
Calcium: 8.8 mg/dL (ref 8.4–10.5)
Chloride: 103 mEq/L (ref 96–112)
Creatinine, Ser: 0.97 mg/dL (ref 0.40–1.50)
GFR: 80.32 mL/min (ref >60.00)
Glucose, Bld: 78 mg/dL (ref 70–99)
Potassium: 4.2 mEq/L (ref 3.5–5.1)
Sodium: 139 mEq/L (ref 135–145)

## 2020-05-10 LAB — URINALYSIS, ROUTINE W REFLEX MICROSCOPIC
Bilirubin Urine: NEGATIVE
Hgb urine dipstick: NEGATIVE
Ketones, ur: NEGATIVE
Leukocytes,Ua: NEGATIVE
Nitrite: NEGATIVE
RBC / HPF: NONE SEEN (ref 0–?)
Specific Gravity, Urine: 1.025 (ref 1.000–1.030)
Total Protein, Urine: NEGATIVE
Urine Glucose: NEGATIVE
Urobilinogen, UA: 0.2 (ref 0.0–1.0)
WBC, UA: NONE SEEN (ref 0–?)
pH: 6 (ref 5.0–8.0)

## 2020-05-10 LAB — PSA: PSA: 0.76 ng/mL (ref 0.10–4.00)

## 2020-05-10 LAB — FOLATE: Folate: 23.5 ng/mL (ref >5.9)

## 2020-05-10 LAB — TSH: TSH: 1.38 u[IU]/mL (ref 0.35–4.50)

## 2020-05-10 LAB — VITAMIN B12: Vitamin B-12: 476 pg/mL (ref 211–911)

## 2020-05-10 LAB — IRON: Iron: 36 ug/dL — ABNORMAL LOW (ref 42–165)

## 2020-05-10 LAB — FERRITIN: Ferritin: 22.2 ng/mL (ref 22.0–322.0)

## 2020-05-10 MED ORDER — RIVAROXABAN 10 MG PO TABS: 10.0000 mg | ORAL_TABLET | Freq: Every day | ORAL | 1 refills | Status: DC

## 2020-05-10 MED ORDER — ROSUVASTATIN CALCIUM 10 MG PO TABS: 10.0000 mg | ORAL_TABLET | Freq: Every day | ORAL | 1 refills | Status: DC

## 2020-05-10 NOTE — Progress Notes (Signed)
Subjective:  Patient ID: Willie Black, male    DOB: 1951-05-24  Age: 69 y.o. MRN: [id number]  CC: Annual Exam, Hypertension, Hyperlipidemia, and Anemia  This visit occurred during the SARS-CoV-2 public health emergency.  Safety protocols were in place, including screening questions prior to the visit, additional usage of staff PPE, and extensive cleaning of exam room while observing appropriate contact time as indicated for disinfecting solutions.    HPI Willie Black presents for a CPX.  He developed muscle aches when he was taking 20 mg of rosuvastatin a day.  He decreased the dose to 10 mg a day and does not experience muscle aches.  He is active and denies any recent episodes of chest pain, shortness of breath, palpitations, edema, or fatigue.  He continues to complain of ringing in the ears and loss of hearing.  He tells me that years ago he saw an ENT doctor and nothing abnormal was found.  He has not recently noticed sources of blood loss.  He thinks he has had a colonoscopy within the last year by a GI doctor named Teena Irani.  Outpatient Medications Prior to Visit  Medication Sig Dispense Refill  . Cholecalciferol (VITAMIN D3 PO) Take 1 tablet by mouth daily.    . Cyanocobalamin (VITAMIN B-12 PO) Take 1 capsule by mouth daily.    Marland Kitchen escitalopram (LEXAPRO) 20 MG tablet Take 20 mg by mouth daily.  1  . esomeprazole (NEXIUM) 20 MG capsule Take 20 mg by mouth daily at 12 noon.    . metoprolol tartrate (LOPRESSOR) 25 MG tablet Take 1 tablet (25 mg total) by mouth 2 (two) times daily. 270 tablet 3  . OXcarbazepine (TRILEPTAL) 150 MG tablet Take 300 mg by mouth daily.     . Potassium 99 MG TABS Take 1 tablet by mouth daily.    . vitamin E 100 UNIT capsule Take 100 Units by mouth daily.    Marland Kitchen aspirin EC 81 MG tablet Take 81 mg by mouth daily.    . rosuvastatin (CRESTOR) 20 MG tablet Take 10 mg by mouth daily.    . Suvorexant (BELSOMRA) 15 MG TABS Take 1 tablet by mouth at  bedtime. 30 tablet 1  . XARELTO 10 MG TABS tablet TAKE 1 TABLET BY MOUTH EVERY DAY 90 tablet 0   No facility-administered medications prior to visit.    ROS Review of Systems  Constitutional: Positive for unexpected weight change (wt gain). Negative for appetite change, chills, diaphoresis and fatigue.  HENT: Negative.  Negative for sinus pressure, sore throat, trouble swallowing and voice change.   Eyes: Negative.   Respiratory: Negative for cough, chest tightness, shortness of breath and wheezing.   Cardiovascular: Negative for chest pain, palpitations and leg swelling.  Gastrointestinal: Negative for abdominal pain, blood in stool, constipation, diarrhea, nausea and vomiting.  Endocrine: Negative.   Genitourinary: Positive for difficulty urinating. Negative for dysuria, scrotal swelling and testicular pain.       + weak urine stream and ED  Musculoskeletal: Negative for arthralgias and myalgias.  Hematological: Negative for adenopathy. Does not bruise/bleed easily.  Psychiatric/Behavioral: Negative.     Objective:  BP 126/80   Pulse 64   Temp 98.3 F (36.8 C) (Oral)   Ht 6\' 2"  (1.88 m)   Wt 255 lb (115.7 kg)   SpO2 97%   BMI 32.74 kg/m   BP Readings from Last 3 Encounters:  05/10/20 126/80  10/26/19 126/66  04/27/19 111/63    Wt  Readings from Last 3 Encounters:  05/10/20 255 lb (115.7 kg)  10/26/19 253 lb (114.8 kg)  04/27/19 244 lb (110.7 kg)    Physical Exam Vitals reviewed.  HENT:     Right Ear: Tympanic membrane, ear canal and external ear normal. Decreased hearing noted.     Left Ear: Tympanic membrane, ear canal and external ear normal. Decreased hearing noted.     Nose: Nose normal.     Mouth/Throat:     Mouth: Mucous membranes are moist.  Eyes:     General: No scleral icterus.    Conjunctiva/sclera: Conjunctivae normal.  Cardiovascular:     Rate and Rhythm: Normal rate and regular rhythm.     Heart sounds: No murmur heard.   Pulmonary:      Breath sounds: No stridor. No wheezing, rhonchi or rales.  Abdominal:     General: Abdomen is flat.     Palpations: There is no mass.     Tenderness: There is no abdominal tenderness. There is no guarding.     Hernia: No hernia is present. There is no hernia in the left inguinal area or right inguinal area.  Genitourinary:    Pubic Area: No rash.      Penis: Normal and circumcised. No discharge, swelling or lesions.      Testes: Normal.        Right: Mass, tenderness or swelling not present.        Left: Mass, tenderness or swelling not present.     Epididymis:     Right: Normal. No tenderness.     Left: Normal. No tenderness.     Prostate: Enlarged (1+ smooth symm BPH). Not tender and no nodules present.     Rectum: Normal. Guaiac result negative. No mass, tenderness, anal fissure, external hemorrhoid or internal hemorrhoid. Normal anal tone.  Musculoskeletal:        General: Normal range of motion.     Cervical back: Neck supple.     Right lower leg: No edema.     Left lower leg: No edema.  Lymphadenopathy:     Cervical: No cervical adenopathy.     Lower Body: No right inguinal adenopathy. No left inguinal adenopathy.  Skin:    General: Skin is warm and dry.     Coloration: Skin is not pale.  Neurological:     General: No focal deficit present.     Mental Status: He is alert.  Psychiatric:        Mood and Affect: Mood normal.        Behavior: Behavior normal.     Lab Results  Component Value Date   WBC 5.2 05/10/2020   HGB 12.5 (L) 05/10/2020   HCT 38.4 (L) 05/10/2020   PLT 216.0 05/10/2020   GLUCOSE 78 05/10/2020   CHOL 134 10/26/2019   TRIG 58 10/26/2019   HDL 48 10/26/2019   LDLCALC 74 10/26/2019   ALT 12 10/26/2019   AST 17 10/26/2019   NA 139 05/10/2020   K 4.2 05/10/2020   CL 103 05/10/2020   CREATININE 0.97 05/10/2020   BUN 16 05/10/2020   CO2 32 05/10/2020   TSH 1.38 05/10/2020   PSA 0.76 05/10/2020   INR 0.97 11/22/2010    VAS [place] AAA  DUPLEX  Result Date: 10/02/2019 ABDOMINAL AORTA STUDY Indications: Patient is here for follow up to distal AAA. Strong family history,              father and paternal grandfather  both died from AAA. Patient has              been complaining of deep right pelvic pain intermittently for about              6 months. Risk Factors: Hypertension, hyperlipidemia, past history of smoking. Other Factors: CABG. Limitations: Air/bowel gas and obesity.  Comparison Study: Prior abdominal ultrasound 09/05/2017 showed a distal AAA                   measuring 3.1 cm. Performing Technologist: Salvadore Dom RVT, RDCS (AE), RDMS  Examination Guidelines: A complete evaluation includes B-mode imaging, spectral Doppler, color Doppler, and power Doppler as needed of all accessible portions of each vessel. Bilateral testing is considered an integral part of a complete examination. Limited examinations for reoccurring indications may be performed as noted.  Abdominal Aorta Findings: +-------------+-------+----------+----------+---------+-------------+----------+ Location     AP (cm)Trans (cm)PSV (cm/s)Waveform Thrombus     Comments   +-------------+-------+----------+----------+---------+-------------+----------+ Proximal     2.30   2.30      94        biphasic              limited                                                                  view due                                                                 to                                                                       overlying                                                                bowel gas. +-------------+-------+----------+----------+---------+-------------+----------+ Mid          2.20   2.20      82        biphasic                         +-------------+-------+----------+----------+---------+-------------+----------+ Distal       2.70   3.10      78        biphasic plaque withinsaccular    +-------------+-------+----------+----------+---------+-------------+----------+ RT CIA Prox  1.5    1.3       116       biphasic              ectatic    +-------------+-------+----------+----------+---------+-------------+----------+  RT CIA Mid                    91        biphasic                         +-------------+-------+----------+----------+---------+-------------+----------+ RT CIA Distal                 162       biphasic                         +-------------+-------+----------+----------+---------+-------------+----------+ RT EIA Prox  1.6    1.7       146       biphasic              ectatic    +-------------+-------+----------+----------+---------+-------------+----------+ RT EIA Mid                    206       triphasic             >50%                                                                     stenosis   +-------------+-------+----------+----------+---------+-------------+----------+ RT EIA Distal                 218       triphasic             >50 %                                                                    stenosis   +-------------+-------+----------+----------+---------+-------------+----------+ LT CIA Prox  1.7    1.2       62        biphasic              ectatic    +-------------+-------+----------+----------+---------+-------------+----------+ LT CIA Mid                    110       biphasic                         +-------------+-------+----------+----------+---------+-------------+----------+ LT CIA Distal                 173       biphasic                         +-------------+-------+----------+----------+---------+-------------+----------+ LT EIA Prox  1.5    1.3       138       biphasic              ectatic    +-------------+-------+----------+----------+---------+-------------+----------+ LT EIA Mid                    138       biphasic                          +-------------+-------+----------+----------+---------+-------------+----------+  LT EIA Distal                 142       biphasic                         +-------------+-------+----------+----------+---------+-------------+----------+ Visualization of the Proximal Abdominal Aorta and [place] artery was limited. IVC/Iliac Findings: +--------+------+--------+--------+   IVC   PatentThrombusComments +--------+------+--------+--------+ IVC Proxpatent                 +--------+------+--------+--------+    Summary: Abdominal Aorta: There is evidence of abnormal dilatation of the distal Abdominal aorta. There is evidence of abnormal dilation of the Right Common Iliac artery, Left Common Iliac artery, Right External Iliac artery and Left External Iliac artery. The largest aortic measurement is 3.1 cm. The largest aortic diameter remains essentially unchanged compared to prior exam. Previous diameter measurement was 3.1 cm obtained on 09/05/2017. Stenosis: There is no evidence of stenosis seen in aorta, right CIA, left CIA and left EIA. There is >50 % stenosis seen in the right EIA.  *See table(s) above for measurements and observations. Suggest follow up study in 12 months.  Electronically signed by Carlyle Dolly MD on 10/02/2019 at 8:43:49 AM.    Final     Assessment & Plan:   Emran was seen today for annual exam, hypertension, hyperlipidemia and anemia.  Diagnoses and all orders for this visit:  Flu vaccine need -     Flu Vaccine QUAD High Dose(Fluad)  Other primary thrombophilia ([place])- Will continue the DOAC at the same dose to prevent recurrent thromboembolic events. -     rivaroxaban (XARELTO) 10 MG TABS tablet; Take 1 tablet (10 mg total) by mouth daily.  Sensorineural hearing loss (SNHL) of both ears -     Ambulatory referral to Audiology  Essential hypertension- His blood pressure is adequately well controlled.  Electrolytes and renal function are normal. -     Basic  metabolic panel; Future -     TSH; Future -     TSH -     Basic metabolic panel  Benign prostatic hyperplasia without lower urinary tract symptoms- Will treat his symptoms with tadalafil. -     PSA; Future -     Urinalysis, Routine w reflex microscopic; Future -     Urinalysis, Routine w reflex microscopic -     PSA -     tadalafil (CIALIS) 5 MG tablet; Take 1 tablet (5 mg total) by mouth daily as needed for erectile dysfunction.  Deficiency anemia- His H&H remain low and his iron level is low. -     Vitamin B12; Future -     Iron; Future -     Ferritin; Future -     Folate; Future -     CBC with Differential/Platelet; Future -     CBC with Differential/Platelet -     Folate -     Ferritin -     Iron -     Vitamin B12  Routine general medical examination at a health care facility- Exam completed, labs reviewed, vaccines reviewed and updated, he is referred for colon cancer screening, patient education material was given.  Coronary artery disease involving native coronary artery of native heart without angina pectoris- He has had no recent episodes of angina.  Will continue risk factor modifications. -     rosuvastatin (CRESTOR) 10 MG tablet; Take 1 tablet (10 mg total) by mouth daily.  Hyperlipidemia  LDL goal <70- He has achieved his LDL goal and is doing well on the current dose of the statin. -     rosuvastatin (CRESTOR) 10 MG tablet; Take 1 tablet (10 mg total) by mouth daily.  Iron deficiency anemia due to dietary causes- Will treat this with an iron supplement. I have asked for records from Dr Amedeo Plenty to no avail. I also cannot find any records of a colonoscopy so I have asked him to see GI to be screened for GI sources of blood loss. -     ferrous sulfate 325 (65 FE) MG tablet; Take 1 tablet (325 mg total) by mouth 2 (two) times daily with a meal. -     Ascorbic Acid (VITAMIN C) 100 MG tablet; Take 1 tablet (100 mg total) by mouth in the morning and at bedtime. -     Ambulatory  referral to Gastroenterology  Erectile dysfunction due to arterial disease (HCC) -     tadalafil (CIALIS) 5 MG tablet; Take 1 tablet (5 mg total) by mouth daily as needed for erectile dysfunction.  Screen for colon cancer -     Ambulatory referral to Gastroenterology  Other orders -     Pneumococcal polysaccharide vaccine 23-valent greater than or equal to 2yo subcutaneous/IM   I have discontinued Kahli R. Smolinsky's aspirin EC, Belsomra, and rosuvastatin. I have also changed his Xarelto to rivaroxaban. Additionally, I am having him start on rosuvastatin, ferrous sulfate, vitamin C, and tadalafil. Lastly, I am having him maintain his escitalopram, OXcarbazepine, esomeprazole, Potassium, vitamin E, Cholecalciferol (VITAMIN D3 PO), Cyanocobalamin (VITAMIN B-12 PO), and metoprolol tartrate.  Meds ordered this encounter  Medications  . rivaroxaban (XARELTO) 10 MG TABS tablet    Sig: Take 1 tablet (10 mg total) by mouth daily.    Dispense:  90 tablet    Refill:  1  . rosuvastatin (CRESTOR) 10 MG tablet    Sig: Take 1 tablet (10 mg total) by mouth daily.    Dispense:  90 tablet    Refill:  1  . ferrous sulfate 325 (65 FE) MG tablet    Sig: Take 1 tablet (325 mg total) by mouth 2 (two) times daily with a meal.    Dispense:  180 tablet    Refill:  1  . Ascorbic Acid (VITAMIN C) 100 MG tablet    Sig: Take 1 tablet (100 mg total) by mouth in the morning and at bedtime.    Dispense:  180 tablet    Refill:  1  . tadalafil (CIALIS) 5 MG tablet    Sig: Take 1 tablet (5 mg total) by mouth daily as needed for erectile dysfunction.    Dispense:  90 tablet    Refill:  1   In addition to time spent on CPE, I spent 50 minutes in preparing to see the patient by review of recent labs, imaging and procedures, obtaining and reviewing separately obtained history, communicating with the patient and family or caregiver, ordering medications, tests or procedures, and documenting clinical information in  the EHR including the differential Dx, treatment, and any further evaluation and other management of 1. Other primary thrombophilia ([place]) 2. Sensorineural hearing loss (SNHL) of both ears 3. Essential hypertension 4. Benign prostatic hyperplasia without lower urinary tract symptoms 5. Deficiency anemia 6. Coronary artery disease involving native coronary artery of native heart without angina pectoris 7. Hyperlipidemia LDL goal <70 8. Iron deficiency anemia due to dietary causes 9. Erectile dysfunction due to arterial disease (  [place])     Follow-up: Return in about 6 months (around 11/07/2020).  Scarlette Calico, MD

## 2020-05-10 NOTE — Patient Instructions (Signed)

## 2020-05-10 NOTE — Telephone Encounter (Signed)
   Debra from GI returning call regarding colonscopy, patient has not been seen since 2011. The provider that the patient saw is no longer with the group. A release form will be needed to obtain records Any questions call [phone]

## 2020-05-11 DIAGNOSIS — D508 Other iron deficiency anemias: Secondary | ICD-10-CM | POA: Insufficient documentation

## 2020-05-11 DIAGNOSIS — I2510 Atherosclerotic heart disease of native coronary artery without angina pectoris: Secondary | ICD-10-CM | POA: Insufficient documentation

## 2020-05-11 MED ORDER — FERROUS SULFATE 325 (65 FE) MG PO TABS: 325.0000 mg | ORAL_TABLET | Freq: Two times a day (BID) | ORAL | 1 refills | Status: DC

## 2020-05-11 MED ORDER — VITAMIN C 100 MG PO TABS: 100.0000 mg | ORAL_TABLET | Freq: Two times a day (BID) | ORAL | 1 refills | Status: DC

## 2020-05-12 ENCOUNTER — Telehealth: Payer: Self-pay | Admitting: Internal Medicine

## 2020-05-12 DIAGNOSIS — N521 Erectile dysfunction due to diseases classified elsewhere: Secondary | ICD-10-CM | POA: Insufficient documentation

## 2020-05-12 DIAGNOSIS — I779 Disorder of arteries and arterioles, unspecified: Secondary | ICD-10-CM | POA: Insufficient documentation

## 2020-05-12 DIAGNOSIS — Z1211 Encounter for screening for malignant neoplasm of colon: Secondary | ICD-10-CM | POA: Insufficient documentation

## 2020-05-12 MED ORDER — TADALAFIL 5 MG PO TABS: 5.0000 mg | ORAL_TABLET | Freq: Every day | ORAL | 1 refills | Status: DC | PRN

## 2020-05-12 NOTE — Telephone Encounter (Signed)
Pt has been informed med has been sent to his pharmacy.

## 2020-05-12 NOTE — Telephone Encounter (Signed)
After reviewing the med list I do not see that anything was rx'd for ED. Please advise.

## 2020-05-12 NOTE — Telephone Encounter (Signed)
   Patient would like to know if medication for ED is being prescribed as discussed in last office visit

## 2020-05-25 ENCOUNTER — Other Ambulatory Visit: Payer: Self-pay

## 2020-05-25 ENCOUNTER — Ambulatory Visit: Payer: Medicare Other | Attending: Internal Medicine | Admitting: Audiologist

## 2020-05-25 DIAGNOSIS — H903 Sensorineural hearing loss, bilateral: Secondary | ICD-10-CM | POA: Insufficient documentation

## 2020-05-25 DIAGNOSIS — H9313 Tinnitus, bilateral: Secondary | ICD-10-CM | POA: Diagnosis not present

## 2020-05-25 NOTE — Procedures (Signed)
Outpatient Aud[facility] and [place], [place]  [id number] [phone]  AUDIOLOGICAL  EVALUATION  NAME: Willie Black     DOB:   1950-07-16      MRN: [id number]                                                                                     DATE: 05/25/2020     REFERENT: Janith Lima, MD STATUS: Outpatient DIAGNOSIS:  Sensorineural hearing loss (SNHL) of both ears , Tinnitus of both ears   History: Willie Black was seen for an audiological evaluation.  Willie Black is receiving a hearing evaluation due to concerns for tinnitus in both ears. Willie Black has difficulty hearing in background noise, crowds, and when people are at a distance. He has significant difficulty hearing on the phone. He says he can hear well, just not understand. Everything sounds unclear. This difficulty began gradually. No pain or pressure reported in either ear. Severe tinnitus present in both ears sounding like a ring. The tinnitus starts off quiet in the morning then becomes excruciatingly loud by the end of the day. Willie Black has a history of noise exposure from working around [employer] working with [employer]. Willie Black previous has seen an ENT physician.   Medical history negative for risk factors for hearing loss. No other relevant case history reported.    Evaluation:   Otoscopy showed a clear view of the tympanic membranes, bilaterally  Tympanometry results were consistent with normal middle ear function, bilaterally    Audiometric testing was completed using conventional audiometry with insert transducer. Speech Recognition Thresholds were consistent with pure tone averages. Word Recognition was excellent at an elevated level and fair at a conversational level. Pulsed Pure Tone used, no confusion with tinnitus noted. Pure tone thresholds show mild to moderately severe sensorineural hearing loss in both ears. Test results are consistent with slight air bone gap at 1k Hz in the  left ear. Right ear hearing 15dB worse at 4k Hz only, hearing otherwise symmetric.  Tinnitus was matched to a 2k Hz pure tone at 5dB SL in the left ear and 7dB SL in the right ear. This shows tinnitus is relatively soft when matched to external sound. Tinnitus matched to same pitch at notch in his hearing, good candidate for masking notch therapy centered on 2k Hz. On a scale of 1-10 with 10 a perfect match, Willie Black rated this tone an 8.   Results:  The test results were reviewed with [facility]. The nature and degree of Willie Black's hearing loss was explained. He has significant hearing loss in both ears. The tinnitus was centered on the same pitch as the notch in Willie Black's hearing. This makes Willie Black a good candidate for a hearing aid with notch therapy that can be centered on a 2k Hz. Hearing aids will also help alleviate Willie Black perception of tinnitus. This was explained using the candle example. Willie Black reported all that was dicussed today. He asked about insurance coverage for hearing aids. He was provided with the number for [employer] to find a local provider in network. He was also informed that [facility] [place] Ponder specializes in tinnitus masking  therapy and could be a good fit. He was provided with a comprehensive list of local audiologists that dispense hearing aids.   Recommendations: 1. Patient is an excellent candidate for hearing aids with masking therapy to address tinnitus and hearing loss. Consistent daily use of approiately fit and verified hearing aids recommended. Masking needs to also be verified to provide sufficient coverage of tinnitus using [employer] verification. Patient provided with list of local audiologists that dispense hearing aids.   Willie Black  Audiologist, Au.D., CCC-A 05/25/2020  3:35 PM  Cc: Janith Lima, MD

## 2020-06-21 DIAGNOSIS — C4441 Basal cell carcinoma of skin of scalp and neck: Secondary | ICD-10-CM | POA: Diagnosis not present

## 2020-06-21 DIAGNOSIS — L820 Inflamed seborrheic keratosis: Secondary | ICD-10-CM | POA: Diagnosis not present

## 2020-06-21 DIAGNOSIS — C44719 Basal cell carcinoma of skin of left lower limb, including hip: Secondary | ICD-10-CM | POA: Diagnosis not present

## 2020-06-21 DIAGNOSIS — Z85828 Personal history of other malignant neoplasm of skin: Secondary | ICD-10-CM | POA: Diagnosis not present

## 2020-06-21 DIAGNOSIS — D485 Neoplasm of uncertain behavior of skin: Secondary | ICD-10-CM | POA: Diagnosis not present

## 2020-06-21 DIAGNOSIS — L905 Scar conditions and fibrosis of skin: Secondary | ICD-10-CM | POA: Diagnosis not present

## 2020-07-03 ENCOUNTER — Other Ambulatory Visit: Payer: Self-pay | Admitting: Cardiology

## 2020-07-05 DIAGNOSIS — D0439 Carcinoma in situ of skin of other parts of face: Secondary | ICD-10-CM | POA: Diagnosis not present

## 2020-07-05 DIAGNOSIS — L570 Actinic keratosis: Secondary | ICD-10-CM | POA: Diagnosis not present

## 2020-07-05 DIAGNOSIS — C44319 Basal cell carcinoma of skin of other parts of face: Secondary | ICD-10-CM | POA: Diagnosis not present

## 2020-07-05 DIAGNOSIS — D485 Neoplasm of uncertain behavior of skin: Secondary | ICD-10-CM | POA: Diagnosis not present

## 2020-08-02 DIAGNOSIS — C44319 Basal cell carcinoma of skin of other parts of face: Secondary | ICD-10-CM | POA: Diagnosis not present

## 2020-08-02 DIAGNOSIS — D0439 Carcinoma in situ of skin of other parts of face: Secondary | ICD-10-CM | POA: Diagnosis not present

## 2020-08-02 DIAGNOSIS — D485 Neoplasm of uncertain behavior of skin: Secondary | ICD-10-CM | POA: Diagnosis not present

## 2020-08-16 DIAGNOSIS — D0439 Carcinoma in situ of skin of other parts of face: Secondary | ICD-10-CM | POA: Diagnosis not present

## 2020-08-16 DIAGNOSIS — L570 Actinic keratosis: Secondary | ICD-10-CM | POA: Diagnosis not present

## 2020-08-16 DIAGNOSIS — C44319 Basal cell carcinoma of skin of other parts of face: Secondary | ICD-10-CM | POA: Diagnosis not present

## 2020-08-16 DIAGNOSIS — C44329 Squamous cell carcinoma of skin of other parts of face: Secondary | ICD-10-CM | POA: Diagnosis not present

## 2020-08-21 DIAGNOSIS — M47816 Spondylosis without myelopathy or radiculopathy, lumbar region: Secondary | ICD-10-CM | POA: Diagnosis not present

## 2020-08-21 DIAGNOSIS — G500 Trigeminal neuralgia: Secondary | ICD-10-CM | POA: Diagnosis not present

## 2020-08-21 DIAGNOSIS — G894 Chronic pain syndrome: Secondary | ICD-10-CM | POA: Diagnosis not present

## 2020-10-10 ENCOUNTER — Other Ambulatory Visit: Payer: Self-pay | Admitting: Internal Medicine

## 2020-10-10 ENCOUNTER — Telehealth: Payer: Self-pay | Admitting: Cardiology

## 2020-10-10 DIAGNOSIS — I714 Abdominal aortic aneurysm, without rupture, unspecified: Secondary | ICD-10-CM

## 2020-10-10 NOTE — Telephone Encounter (Signed)
4.19.22 LVM on cell phone to sch 2 yr fu w/Dr [place]. LParmele

## 2020-10-12 DIAGNOSIS — D485 Neoplasm of uncertain behavior of skin: Secondary | ICD-10-CM | POA: Diagnosis not present

## 2020-10-12 DIAGNOSIS — L570 Actinic keratosis: Secondary | ICD-10-CM | POA: Diagnosis not present

## 2020-10-25 ENCOUNTER — Telehealth: Payer: Self-pay | Admitting: Internal Medicine

## 2020-10-25 NOTE — Progress Notes (Signed)
  Chronic Care Management   Outreach Note  10/25/2020 Name: Willie Black MRN: [id number] DOB: [birth year]  Referred by: Janith Lima, MD Reason for referral : No chief complaint on file.   An unsuccessful telephone outreach was attempted today. The patient was referred to the pharmacist for assistance with care management and care coordination.   Follow Up Plan:   Carley Perdue [employer] Scheduler

## 2020-11-01 ENCOUNTER — Telehealth: Payer: Self-pay | Admitting: Internal Medicine

## 2020-11-01 NOTE — Progress Notes (Signed)
  Chronic Care Management   Note  11/01/2020 Name: Willie Black MRN: [id number] DOB: [birth year]  Willie Black is a 70 y.o. year old male who is a primary care patient of Janith Lima, MD. I reached out to Conan Bowens by phone today in response to a referral sent by Willie Black's PCP, Janith Lima, MD.   Mr. Fitzsimmons was given information about Chronic Care Management services today including:  1. CCM service includes personalized support from designated clinical staff supervised by his physician, including individualized plan of care and coordination with other care providers 2. 24/7 contact phone numbers for assistance for urgent and routine care needs. 3. Service will only be billed when office clinical staff spend 20 minutes or more in a month to coordinate care. 4. Only one practitioner may furnish and bill the service in a calendar month. 5. The patient may stop CCM services at any time (effective at the end of the month) by phone call to the office staff.   Patient agreed to services and verbal consent obtained.   Follow up plan:   Carley Perdue [employer] Scheduler

## 2020-11-03 ENCOUNTER — Other Ambulatory Visit: Payer: Self-pay | Admitting: Internal Medicine

## 2020-11-03 DIAGNOSIS — D6859 Other primary thrombophilia: Secondary | ICD-10-CM

## 2020-11-08 ENCOUNTER — Other Ambulatory Visit: Payer: Self-pay

## 2020-11-08 ENCOUNTER — Ambulatory Visit (HOSPITAL_COMMUNITY)
Admission: RE | Admit: 2020-11-08 | Discharge: 2020-11-08 | Disposition: A | Discharge status: Home / Self Care | Payer: Medicare Other | Source: Ambulatory Visit | Attending: Internal Medicine | Admitting: Internal Medicine

## 2020-11-08 DIAGNOSIS — I714 Abdominal aortic aneurysm, without rupture, unspecified: Secondary | ICD-10-CM

## 2020-11-16 ENCOUNTER — Other Ambulatory Visit: Payer: Self-pay | Admitting: Internal Medicine

## 2020-11-16 DIAGNOSIS — D508 Other iron deficiency anemias: Secondary | ICD-10-CM

## 2020-12-06 ENCOUNTER — Telehealth: Payer: Self-pay | Admitting: Pharmacist

## 2020-12-06 NOTE — Progress Notes (Signed)
    Chronic Care Management Pharmacy Assistant   Name: NOLEN LINDAMOOD  MRN: [id number] DOB: August 31, 1950  Reason for Encounter: Initial Questions Appointment: Telephone 12/06/20 @ 11 am  Recent office visits:  05/10/20 Ronnald Ramp (PCP) - Annual exam. Referral to Audiology & Gastroenterology. Start Vit C, Iron, Tadalafil & Rosuvastatin. Changed Xarelto to Rvaroxaban. D/c aspirin & belsomra.  Recent consult visits:  07/05/20 Teague (Pathology) - Actinic keratosis.   07/05/20 Mitkov (Dermatology) - Basal cell carcinoma of skin of other parts of face.  06/21/20 Ouida Sills (Dermatology) - Scar conditions & fibrosis of skin.  06/21/20 Sangueza (Dermatopathology) - Basal cell carcinoma.  Hospital visits:  None in previous 6 months  Medications: Outpatient Encounter Medications as of 12/06/2020  Medication Sig   Ascorbic Acid (VITAMIN C) 100 MG tablet Take 1 tablet (100 mg total) by mouth in the morning and at bedtime.   Cholecalciferol (VITAMIN D3 PO) Take 1 tablet by mouth daily.   Cyanocobalamin (VITAMIN B-12 PO) Take 1 capsule by mouth daily.   escitalopram (LEXAPRO) 20 MG tablet Take 20 mg by mouth daily.   esomeprazole (NEXIUM) 20 MG capsule Take 20 mg by mouth daily at 12 noon.   ferrous sulfate 325 (65 FE) MG tablet TAKE 1 TABLET BY MOUTH 2 TIMES DAILY WITH A MEAL.   metoprolol tartrate (LOPRESSOR) 25 MG tablet TAKE 1 TABLET BY MOUTH TWICE A DAY   OXcarbazepine (TRILEPTAL) 150 MG tablet Take 300 mg by mouth daily.    Potassium 99 MG TABS Take 1 tablet by mouth daily.   rosuvastatin (CRESTOR) 10 MG tablet Take 1 tablet (10 mg total) by mouth daily.   tadalafil (CIALIS) 5 MG tablet Take 1 tablet (5 mg total) by mouth daily as needed for erectile dysfunction.   vitamin E 100 UNIT capsule Take 100 Units by mouth daily.   XARELTO 10 MG TABS tablet TAKE 1 TABLET BY MOUTH EVERY DAY   No facility-administered encounter medications on file as of 12/06/2020.    Have you seen any other  providers since your last visit?   Any changes in your medications or health?   Any side effects from any medications?   Do you have an symptoms or problems not managed by your medications?   Any concerns about your health right now?   Has your provider asked that you check blood pressure, blood sugar, or follow special diet at home?   Do you get any type of exercise on a regular basis?   Can you think of a goal you would like to reach for your health?   Do you have any problems getting your medications?   Is there anything that you would like to discuss during the appointment?   Please bring medications and supplements to appointment   Star Rating Drugs: Rosuvastatin - last fill 08/06/20 [place], RMA Clinical Pharmacists Assistant [phone]  Time Spent: 40  Attempted to contact patient x 3 for medication review and health check, unable to reach patient, left voicemails to return call.

## 2020-12-07 ENCOUNTER — Other Ambulatory Visit: Payer: Self-pay

## 2020-12-07 ENCOUNTER — Ambulatory Visit (INDEPENDENT_AMBULATORY_CARE_PROVIDER_SITE_OTHER): Payer: Medicare Other | Admitting: Pharmacist

## 2020-12-07 DIAGNOSIS — D508 Other iron deficiency anemias: Secondary | ICD-10-CM | POA: Diagnosis not present

## 2020-12-07 DIAGNOSIS — D6859 Other primary thrombophilia: Secondary | ICD-10-CM

## 2020-12-07 DIAGNOSIS — G500 Trigeminal neuralgia: Secondary | ICD-10-CM

## 2020-12-07 DIAGNOSIS — E785 Hyperlipidemia, unspecified: Secondary | ICD-10-CM | POA: Diagnosis not present

## 2020-12-07 DIAGNOSIS — I10 Essential (primary) hypertension: Secondary | ICD-10-CM | POA: Diagnosis not present

## 2020-12-07 DIAGNOSIS — I2510 Atherosclerotic heart disease of native coronary artery without angina pectoris: Secondary | ICD-10-CM

## 2020-12-07 NOTE — Progress Notes (Signed)
Chronic Care Management Pharmacy Note  12/07/2020 Name:  [name] MRN:  [id number] DOB:  10-23-1950  Summary: -Pt received 4 covid shots total, most recent 1 month ago -Pt reports he had colonoscopy with last 2 years at [place] (Dr Teena Irani) -Pt is not sure he needs SSRI anymore  Recommendations/Changes made from today's visit: -Call 888-XARELTO to get lower-cost Xarelto during donut hole -Get Shingrix vaccine at local pharmacy -Request colonoscopy records from [facility] to avoid abrupt discontinuation of escitalopram; if he wishes to stop medication he can taper over 2-3 weeks (1/2 dose x 2 weeks, 1/2 dose QOD x 1 week) -Advised to schedule PCP visit for repeat iron labs   Subjective: Willie Black is an 70 y.o. year old male who is a primary patient of Janith Lima, MD.  The CCM team was consulted for assistance with disease management and care coordination needs.    Engaged with patient by telephone for initial visit in response to provider referral for pharmacy case management and/or care coordination services.   Consent to Services:  The patient was given the following information about Chronic Care Management services today, agreed to services, and gave verbal consent: 1. CCM service includes personalized support from designated clinical staff supervised by the primary care provider, including individualized plan of care and coordination with other care providers 2. 24/7 contact phone numbers for assistance for urgent and routine care needs. 3. Service will only be billed when office clinical staff spend 20 minutes or more in a month to coordinate care. 4. Only one practitioner may furnish and bill the service in a calendar month. 5.The patient may stop CCM services at any time (effective at the end of the month) by phone call to the office staff. 6. The patient will be responsible for cost sharing (co-pay) of up to 20% of the service fee (after  annual deductible is met). Patient agreed to services and consent obtained.  Patient Care Team: Janith Lima, MD as PCP - General (Internal Medicine) Charlton Haws, [facility] as Pharmacist (Pharmacist)  Patient lives at home with his wife. He works full time at [employer].  Recent office visits: 05/10/20 Ronnald Ramp (PCP) - Annual exam. Referral to Audiology & Gastroenterology. Start Vit C, Iron, Tadalafil. Changed rosuvastatin to 10 mg. D/c aspirin & belsomra. F/U 3 months.  Recent consult visits: 11/08/20 [facility]AS AAA - aneurysm remains small and stable. Recheck 1 year 07/05/20 Teague (Pathology) - Actinic keratosis.  07/05/20 Mitkov (Dermatology) - Basal cell carcinoma of skin of other parts of face. 06/21/20 Ouida Sills (Dermatology) - Scar conditions & fibrosis of skin. 06/21/20 Sangueza (Dermatopathology) - Basal cell carcinoma.  Hospital visits: None in previous 6 months   Objective:  Lab Results  Component Value Date   CREATININE 0.97 05/10/2020   BUN 16 05/10/2020   GFR 80.32 05/10/2020   GFRNONAA 77 10/26/2019   GFRAA 89 10/26/2019   NA 139 05/10/2020   K 4.2 05/10/2020   CALCIUM 8.8 05/10/2020   CO2 32 05/10/2020   GLUCOSE 78 05/10/2020    Lab Results  Component Value Date/Time   GFR 80.32 05/10/2020 10:54 AM   GFR 66.18 07/15/2018 03:27 PM    Last diabetic Eye exam: No results found for: HMDIABEYEEXA  Last diabetic Foot exam: No results found for: HMDIABFOOTEX   Lab Results  Component Value Date   CHOL 134 10/26/2019   HDL 48 10/26/2019   LDLCALC 74 10/26/2019   TRIG  58 10/26/2019   CHOLHDL 2.8 10/26/2019    Hepatic Function Latest Ref Rng & Units 10/26/2019 07/15/2018 08/20/2017  Total Protein 6.0 - 8.5 g/dL 7.3 6.8 7.0  Albumin 3.8 - 4.8 g/dL 4.2 4.2 3.8  AST 0 - 40 IU/L 17 17 19   ALT 0 - 44 IU/L 12 17 21   Alk Phosphatase 39 - 117 IU/L 112 80 88  Total Bilirubin 0.0 - 1.2 mg/dL 0.4 0.4 0.4  Bilirubin, Direct 0.00 - 0.40 mg/dL 0.12 - -    Lab  Results  Component Value Date/Time   TSH 1.38 05/10/2020 10:54 AM   TSH 0.98 07/15/2018 03:27 PM    CBC Latest Ref Rng & Units 05/10/2020 03/26/2019 03/25/2019  WBC 4.0 - 10.5 K/uL 5.2 3.7(L) 4.5  Hemoglobin 13.0 - 17.0 g/dL 12.5(L) 12.0(L) 12.8(L)  Hematocrit 39.0 - 52.0 % 38.4(L) 37.4(L) 39.9  Platelets 150.0 - 400.0 K/uL 216.0 PLATELET CLUMPS NOTED ON SMEAR, UNABLE TO ESTIMATE 125(L)   Iron/TIBC/Ferritin/ %Sat    Component Value Date/Time   IRON 36 (L) 05/10/2020 1054   FERRITIN 22.2 05/10/2020 1054    No results found for: VD25OH  Clinical ASCVD: Yes  The ASCVD Risk score Mikey Bussing DC Jr., et al., 2013) failed to calculate for the following reasons:   The patient has a prior MI or stroke diagnosis    Depression screen [facility] 2/9 05/10/2020 07/16/2018 07/15/2018  Decreased Interest 0 0 1  Down, Depressed, Hopeless 0 0 0  PHQ - 2 Score 0 0 1      Social History   Tobacco Use  Smoking Status Former   Pack years: 0.00   Types: Cigarettes   Quit date: 04/27/2009   Years since quitting: 11.6  Smokeless Tobacco Never   BP Readings from Last 3 Encounters:  05/10/20 126/80  10/26/19 126/66  04/27/19 111/63   Pulse Readings from Last 3 Encounters:  05/10/20 64  10/26/19 (!) 59  04/27/19 65   Wt Readings from Last 3 Encounters:  05/10/20 255 lb (115.7 kg)  10/26/19 253 lb (114.8 kg)  04/27/19 244 lb (110.7 kg)   BMI Readings from Last 3 Encounters:  05/10/20 32.74 kg/m  10/26/19 32.48 kg/m  04/27/19 31.33 kg/m    Assessment/Interventions: Review of patient past medical history, allergies, medications, health status, including review of consultants reports, laboratory and other test data, was performed as part of comprehensive evaluation and provision of chronic care management services.   SDOH:  (Social Determinants of Health) assessments and interventions performed: Yes SDOH Interventions    Flowsheet Row Most Recent Value  SDOH Interventions   Financial Strain  Interventions Other (Comment)  [Xarelto - [employer] in donut hole]      SDOH Screenings   Alcohol Screen: Not on file  Depression (PHQ2-9): Low Risk    PHQ-2 Score: 0  Financial Resource Strain: Low Risk    Difficulty of Paying Living Expenses: Not very hard  Food Insecurity: Not on file  Housing: Not on file  Physical Activity: Not on file  Social Connections: Not on file  Stress: Not on file  Tobacco Use: Medium Risk   Smoking Tobacco Use: Former   Smokeless Tobacco Use: Never  Transportation Needs: Not on file    [place]  Allergies  Allergen Reactions   Penicillins Rash    Did it involve swelling of the face/tongue/throat, SOB, or low BP? Unknown Did it involve sudden or severe rash/hives, skin peeling, or any reaction on the inside of  your mouth or nose? Unknown Did you need to seek medical attention at a hospital or doctor's office? Unknown When did it last happen?      childhood If all above answers are "NO", may proceed with cephalosporin use.     Medications Reviewed Today     Reviewed by Charlton Haws, [facility] (Pharmacist) on 12/07/20 at 1136  Med List Status: <None>   Medication Order Taking? Sig Documenting Provider Last Dose Status Informant  Ascorbic Acid (VITAMIN C) 100 MG tablet [id number] Yes Take 1 tablet (100 mg total) by mouth in the morning and at bedtime. Janith Lima, MD Taking Active   Cholecalciferol (VITAMIN D3 PO) [id number] Yes Take 1 tablet by mouth daily. [provider] Taking Active   Cyanocobalamin (VITAMIN B-12 PO) [id number] Yes Take 1 capsule by mouth daily. [provider] Taking Active   escitalopram (LEXAPRO) 20 MG tablet [id number] Yes Take 20 mg by mouth daily. [provider] Taking Active Self  esomeprazole (NEXIUM) 20 MG capsule [id number] Yes Take 20 mg by mouth daily at 12 noon. [provider] Taking Active Self  ferrous sulfate 325 (65 FE) MG tablet [id number] Yes TAKE 1 TABLET  BY MOUTH 2 TIMES DAILY WITH A MEAL. Janith Lima, MD Taking Active   metoprolol tartrate (LOPRESSOR) 25 MG tablet [id number] Yes TAKE 1 TABLET BY MOUTH TWICE A DAY [place], Peter M, MD Taking Active   OXcarbazepine (TRILEPTAL) 150 MG tablet [id number] Yes Take 75 mg by mouth daily. [provider] Taking Active   Potassium 99 MG TABS [id number] Yes Take 1 tablet by mouth daily. [provider] Taking Active Self  rosuvastatin (CRESTOR) 10 MG tablet [id number] Yes Take 1 tablet (10 mg total) by mouth daily. Janith Lima, MD Taking Active   XARELTO 10 MG TABS tablet [id number] Yes TAKE 1 TABLET BY MOUTH EVERY DAY Janith Lima, MD Taking Active             Patient Active Problem List   Diagnosis Date Noted   Erectile dysfunction due to arterial disease ([place]) 05/12/2020   Screen for colon cancer 05/12/2020   Coronary artery disease involving native coronary artery of native heart without angina pectoris 05/11/2020   Iron deficiency anemia due to dietary causes 05/11/2020   Other primary thrombophilia ([place]) 05/10/2020   Sensorineural hearing loss (SNHL) of both ears 05/10/2020   Flu vaccine need 05/10/2020   History of DVT (deep vein thrombosis) 04/27/2019   Deficiency anemia 04/03/2019   Insomnia due to medical condition 04/01/2019   Abnormal brain MRI 07/26/2018   Benign prostatic hyperplasia without lower urinary tract symptoms 07/15/2018   Atypical squamoproliferative skin lesion 07/15/2018   Hypercoagulable state ([place]) 02/25/2018   Trigeminal neuralgia of right side of face 05/15/2016   Other chronic sinusitis 05/01/2016   PVC's (premature ventricular contractions) 04/19/2014   Routine general medical examination at a health care facility 09/30/2013   Abdominal aortic aneurysm ([place]) 09/30/2013   Essential hypertension 05/15/2011   GERD (gastroesophageal reflux disease)    Hyperlipidemia LDL goal <70    Hx of CABG    PULMONARY NODULE 04/12/2010     Immunization History  Administered Date(s) Administered   Fluad Quad(high Dose 65+) 05/10/2020   Influenza, High Dose Seasonal PF 04/16/2018   Influenza,inj,Quad PF,6+ Mos 03/28/2016   Influenza,inj,quad, With Preservative 04/02/2017   Influenza-Unspecified 04/08/2013   PFIZER(Purple Top)SARS-COV-2 Vaccination 04/07/2020   Pneumococcal Conjugate-13 08/20/2017   Pneumococcal Polysaccharide-23 04/09/2012, 05/10/2020  Tdap 09/30/2013    Conditions to be addressed/monitored:  Hypertension, Hyperlipidemia, Coronary Artery Disease, GERD, Depression, and Trigeminal neuralgia, hx DVT  Care Plan : [place]  Updates made by Charlton Haws, [place] since 12/07/2020 12:00 AM     Problem: Hypertension, Hyperlipidemia, Coronary Artery Disease, GERD, Depression, and Trigeminal neuralgia, hx DVT   Priority: High     Long-Range Goal: Disease management   Start Date: 12/07/2020  Expected End Date: 12/07/2021  This Visit's Progress: On track  Priority: High  Note:   Current Barriers:  Unable to independently monitor therapeutic efficacy  Pharmacist Clinical Goal(s):  Patient will achieve adherence to monitoring guidelines and medication adherence to achieve therapeutic efficacy through collaboration with PharmD and provider.   Interventions: 1:1 collaboration with Janith Lima, MD regarding development and update of comprehensive plan of care as evidenced by provider attestation and co-signature Inter-disciplinary care team collaboration (see longitudinal plan of care) Comprehensive medication review performed; medication list updated in electronic medical record  Hypertension (BP goal <130/80) -Controlled - BP has been at goal in clinic; pt does not monitor at home; he denies s/sx hypotension or bradycardia -hx AAA, PVC's -Current treatment: Metoprolol tartrate 25 mg BID -Educated on BP goals and benefits of medications for prevention of heart attack, stroke and  kidney damage; -Counseled to monitor BP at home as needed, document, and provide log at future appointments -Recommended to continue current medication  Hyperlipidemia / CAD: (LDL goal < 70) -Controlled - LDL is close to goal; pt endorses compliance and denies issues -Hx CABG x3 (2011) -Current treatment: Rosuvastatin 10 mg daily -Educated on Cholesterol goals; Benefits of statin for ASCVD risk reduction; -Recommended to continue current medication  Depression/Anxiety (Goal: manage symptoms) -Controlled - pt has been on SSRI for several years and is not sure he still needs it; denies mood issues currently -Current treatment: Escitalopram 20 mg daily (Dr Hardin Negus) -PHQ9: 0 -GAD7: not on file -Connected with Dr Hardin Negus / PCP for mental health support -Educated on Benefits of medication for symptom control -Counseled NOT to abruptly stop medication; if he wishes to discontinue SSRI, advised slow taper over 2-3 weeks -Recommended to continue current medication  Hyperocoaguable state (Goal: prevent VTE) -Controlled - pt reports he had bleeding issues on Xarelto 20 mg, he does well with 10 mg dose -hx DVT 2019 -Current treatment  Xarelto 10 mg daily -Counseled on importance of Xarelto for VTE prevention -Assessed pt finances - he reports Xarelto is affordable until donut hole when it is >$300 per bottle; gave Consulting civil engineer for $85 Xarelto during donut hole (888-XARELTO) -Recommended to continue current medication  Trigeminal neuralgia (Goal: manage pain) -Controlled - pt reports he has not had headaches since neck surgery; he was advised by pain mgmt doctor not to stop oxcarbazepine entirely but to continue low dose indefinitely -Current treatment  Oxcarbazepine 150 mg - 1/2 tab daily -Recommended to continue current medication  GERD (Goal: manage symptoms) -Controlled - pt reports hx hiatal hernia; symptoms are controlled -Current treatment  Esomeprazole 20 mg daily BID  (OTC) -Recommended to continue current medication  Health Maintenance -Vaccine gaps: Shingrix, covid booster -Pt he reports he [facility] 2 [facility] boost[facility], unsure of dates -Current therapy:  Ferrous sulfate 325 mg BID Vitamin B12 Vitamin C Vitamin D Potassium 99 mg -Patient is satisfied with current therapy and denies issues -Advised to get Shingrix vaccine at local pharmacy  Patient Goals/Self-Care Activities Patient will:  - take medications  as prescribed focus on medication adherence by routine -Call 888-XARELTO to get lower-cost Xarelto during donut hole -Get Shingrix vaccine at local pharmacy      Medication Assistance:  [place] during coverage gap (call 888-XARELTO for $85/month Xarelto)  Compliance/Adherence/Medication fill history: Care Gaps: Shingrix Covid booster (due 04/28/20) Colonoscopy (due 10/27/18) Annual FOBT colon cancer screening (due 02/18/20)  Star-Rating Drugs: Rosuvastatin - LF 08/06/20 x 90 ds  [id number] [address]Phone:: [phone]:: [phone] Uses pill box? No - prefers bottles Pt endorses 100% compliance  We discussed: Current pharmacy is preferred with insurance plan and patient is satisfied with pharmacy services Patient decided to: Continue current medication management strategy  Care Plan and Follow Up Patient Decision:  Patient agrees to Care Plan and Follow-up.  Plan: Telephone follow up appointment with care management team member scheduled for:  1 year  LCharlene Brooke PharmD, BSelman CPP Clinical Pharmacist [phone]

## 2020-12-07 NOTE — Patient Instructions (Signed)
Visit Information  Phone number for Pharmacist: [phone]  Thank you for meeting with me to discuss your medications! I look forward to working with you to achieve your health care goals. Below is a summary of what we talked about during the visit:   Goals Addressed             This Visit's Progress    Manage My Medicine       Timeframe:  Long-Range Goal Priority:  Medium Start Date:        12/07/20                     Expected End Date:    12/07/21                   Follow Up Date Dec 2022   - call for medicine refill 2 or 3 days before it runs out - call if I am sick and can't take my medicine - keep a list of all the medicines I take; vitamins and herbals too  -Call 888-XARELTO to get lower-cost Xarelto during donut hole -Get Shingrix vaccine at local pharmacy   Why is this important?   These steps will help you keep on track with your medicines.   Notes:          Mr. Shelnutt was given information about Chronic Care Management services today including:  CCM service includes personalized support from designated clinical staff supervised by his physician, including individualized plan of care and coordination with other care providers 24/7 contact phone numbers for assistance for urgent and routine care needs. Standard insurance, coinsurance, copays and deductibles apply for chronic care management only during months in which we provide at least 20 minutes of these services. Most insurances cover these services at 100%, however patients may be responsible for any copay, coinsurance and/or deductible if applicable. This service may help you avoid the need for more expensive face-to-face services. Only one practitioner may furnish and bill the service in a calendar month. The patient may stop CCM services at any time (effective at the end of the month) by phone call to the office staff.  Patient agreed to services and verbal consent obtained.   Patient verbalizes  understanding of instructions provided today and agrees to view in [place].  Telephone follow up appointment with pharmacy team member scheduled for: 1 year  Charlene Brooke, PharmD, [place], CPP Clinical Pharmacist [facility] [phone]

## 2020-12-08 NOTE — Progress Notes (Signed)
  Called GI office and left a voice mail requesting colonoscopy records be sent. [facility] and [facility] dates are 04/07/20 & 11/05/20.   Orinda Kenner, [place] Clinical Pharmacists Assistant [phone]  Time Spent: [phone]

## 2020-12-20 ENCOUNTER — Telehealth: Payer: Self-pay | Admitting: Pharmacist

## 2020-12-20 NOTE — Progress Notes (Signed)
    Chronic Care Management Pharmacy Assistant   Name: Willie Black  MRN: [id number] DOB: 1951-01-29   Reason for Encounter: Chart Review    Medications: Outpatient Encounter Medications as of 12/20/2020  Medication Sig   Ascorbic Acid (VITAMIN C) 100 MG tablet Take 1 tablet (100 mg total) by mouth in the morning and at bedtime.   Cholecalciferol (VITAMIN D3 PO) Take 1 tablet by mouth daily.   Cyanocobalamin (VITAMIN B-12 PO) Take 1 capsule by mouth daily.   escitalopram (LEXAPRO) 20 MG tablet Take 20 mg by mouth daily.   esomeprazole (NEXIUM) 20 MG capsule Take 20 mg by mouth daily at 12 noon.   ferrous sulfate 325 (65 FE) MG tablet TAKE 1 TABLET BY MOUTH 2 TIMES DAILY WITH A MEAL.   metoprolol tartrate (LOPRESSOR) 25 MG tablet TAKE 1 TABLET BY MOUTH TWICE A DAY   OXcarbazepine (TRILEPTAL) 150 MG tablet Take 75 mg by mouth daily.   Potassium 99 MG TABS Take 1 tablet by mouth daily.   rosuvastatin (CRESTOR) 10 MG tablet Take 1 tablet (10 mg total) by mouth daily.   XARELTO 10 MG TABS tablet TAKE 1 TABLET BY MOUTH EVERY DAY   No facility-administered encounter medications on file as of 12/20/2020.   Pharmacist Review  Reviewed chart for medication changes and adherence.  No OVs, Consults, or hospital visits since last care coordination call / Pharmacist visit. No medication changes indicated  No gaps in adherence identified. Patient has follow up scheduled with pharmacy team. No further action required.   [place] Pharmacist Assistant [phone]   Time spent:5

## 2021-02-04 ENCOUNTER — Other Ambulatory Visit: Payer: Self-pay | Admitting: Internal Medicine

## 2021-02-04 DIAGNOSIS — E785 Hyperlipidemia, unspecified: Secondary | ICD-10-CM

## 2021-02-04 DIAGNOSIS — I2510 Atherosclerotic heart disease of native coronary artery without angina pectoris: Secondary | ICD-10-CM

## 2021-02-19 DIAGNOSIS — G500 Trigeminal neuralgia: Secondary | ICD-10-CM | POA: Diagnosis not present

## 2021-02-19 DIAGNOSIS — M47816 Spondylosis without myelopathy or radiculopathy, lumbar region: Secondary | ICD-10-CM | POA: Diagnosis not present

## 2021-02-19 DIAGNOSIS — G894 Chronic pain syndrome: Secondary | ICD-10-CM | POA: Diagnosis not present

## 2021-04-06 IMAGING — CR DG CHEST 2V
2 series · 2 of 2 positions shown · non-contrast
Comparison: 03/26/2019

CLINICAL DATA: Three days postop VATS.

EXAM:
CHEST - 2 VIEW

[chest pa]
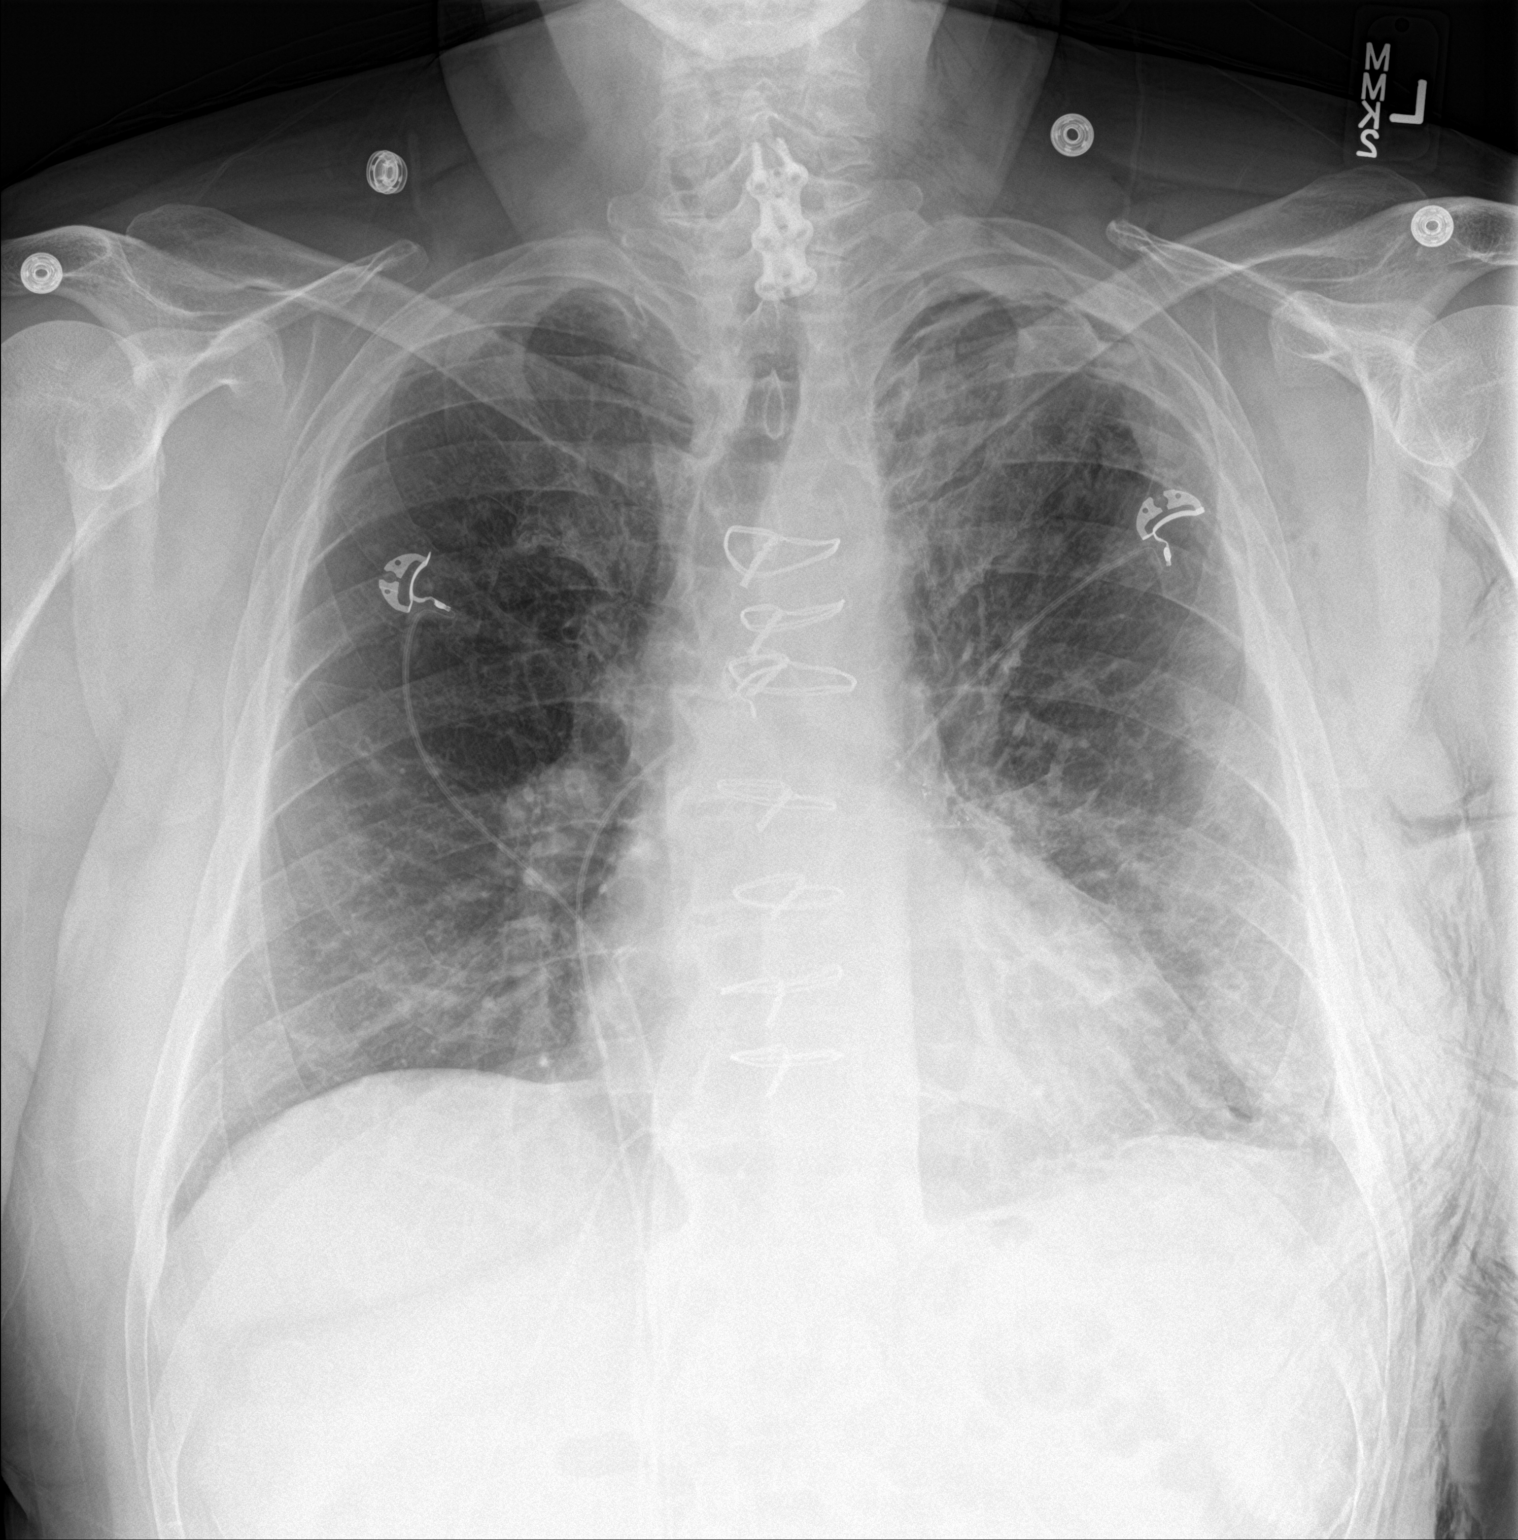

[chest lat]
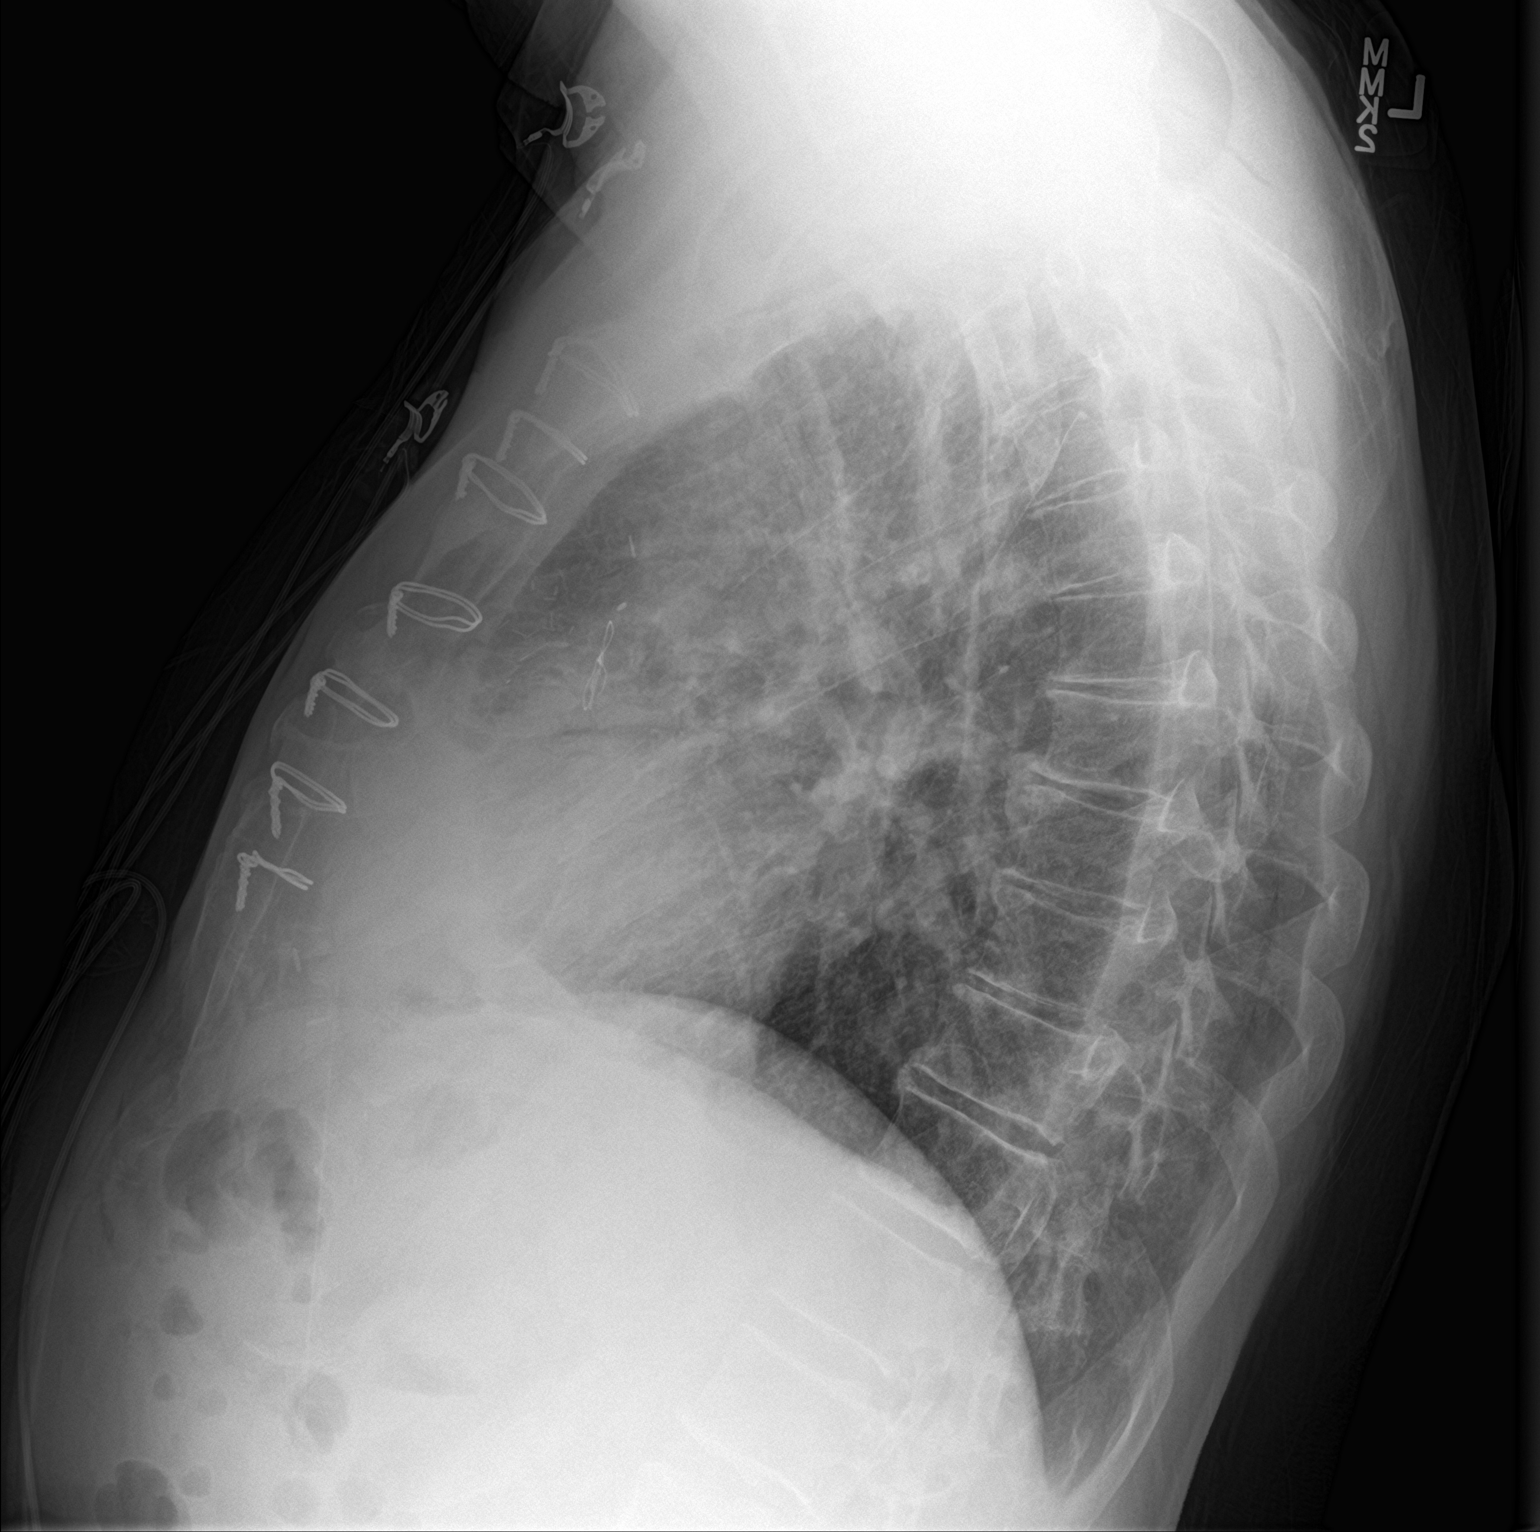

[2 of 2 positions shown; findings below may reference images not displayed]

FINDINGS: Interval removal left-sided chest tube. Lungs are adequately
inflated demonstrate suggestion of a small left apical pneumothorax.
No focal lobar consolidation or effusion. Mild prominence of the
perihilar markings left worse than right. Postsurgical change over
the left hilar region. Cardiomediastinal silhouette and remainder of
the exam is unchanged.
IMPRESSION: Possible minimal vascular congestion.

Interval removal of left-sided chest tube. Suggestion of small
apical pneumothorax.

These results were called by telephone at the time of interpretation
on 03/27/2019 at [DATE] to patient's nurse, Rt[place] Joshjax, who
verbally acknowledged these results.

## 2021-04-17 IMAGING — DX DG CHEST 2V
3 series · 3 of 3 positions shown · non-contrast
Comparison: April 01, 2019

CLINICAL DATA: Recent pneumothorax

EXAM:
CHEST - 2 VIEW

[dg chest 2 view (1 of 3)]
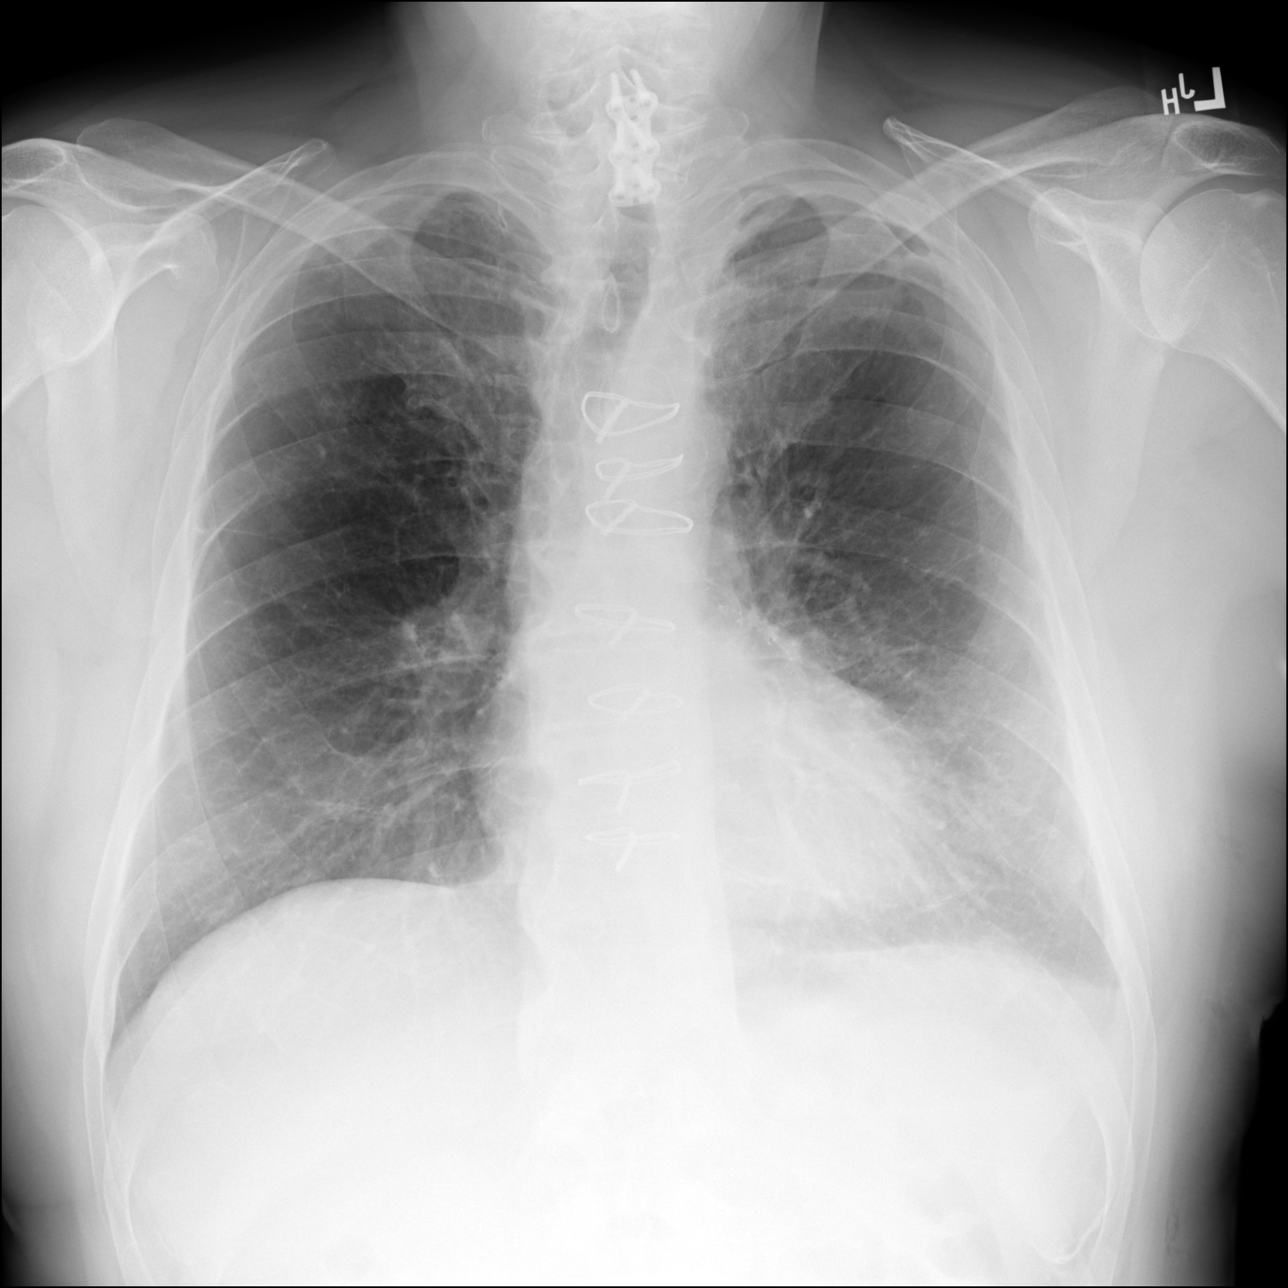

[dg chest 2 view (2 of 3)]
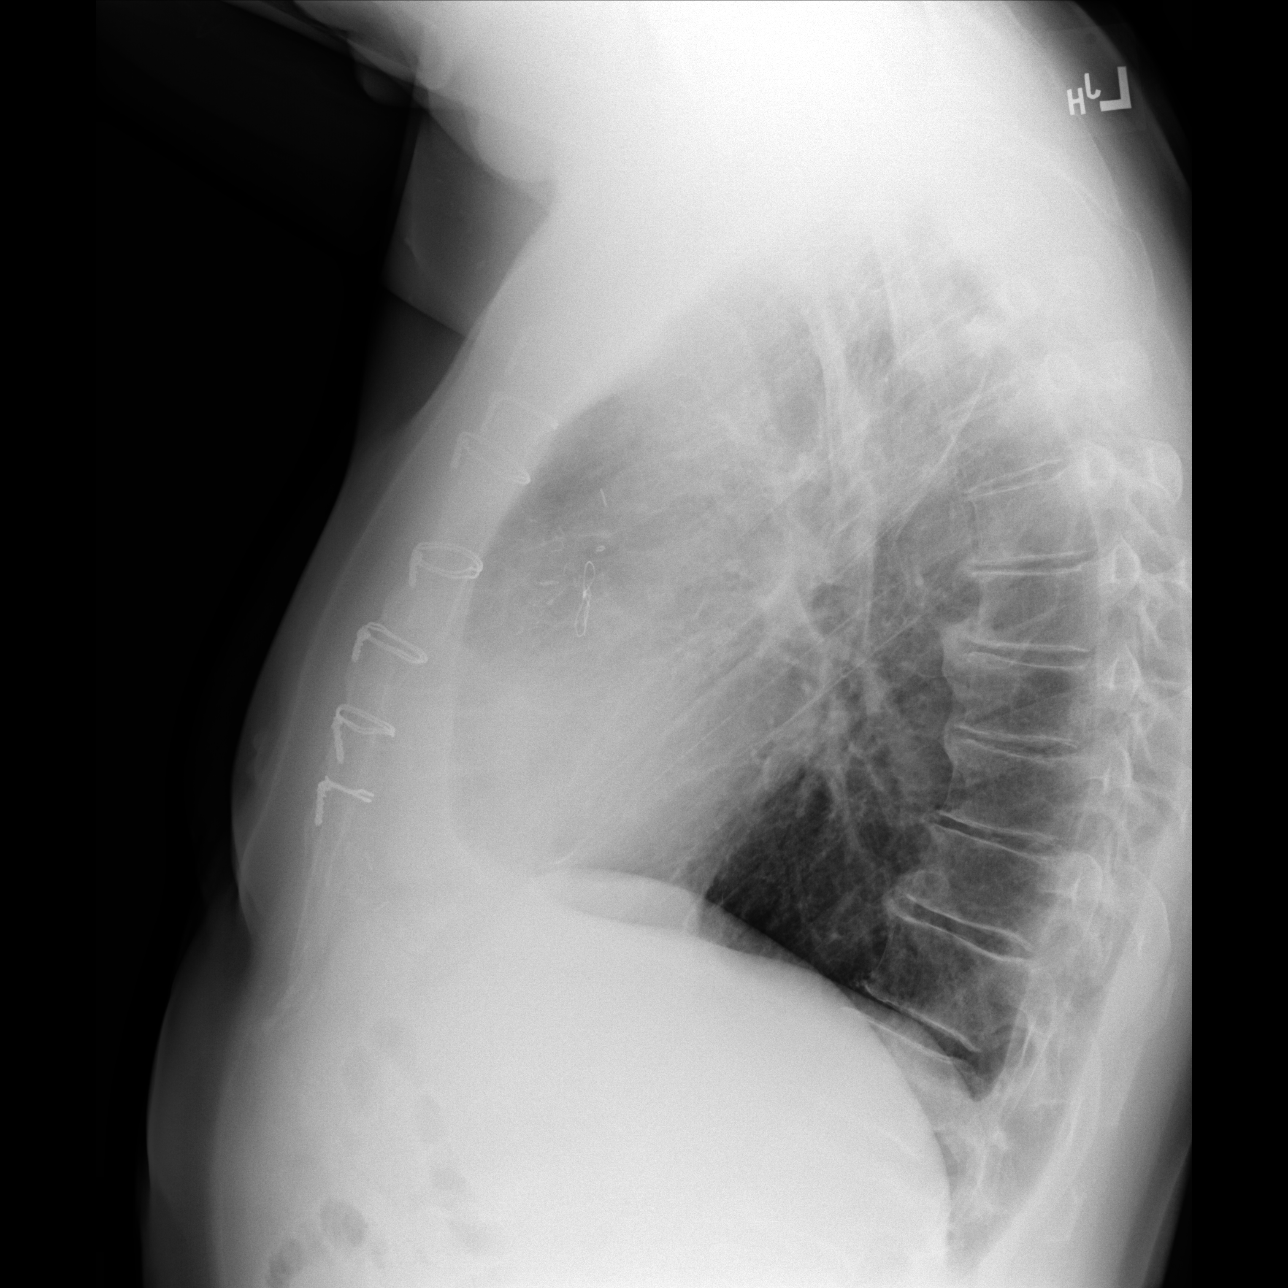

[dg chest 2 view (3 of 3)]
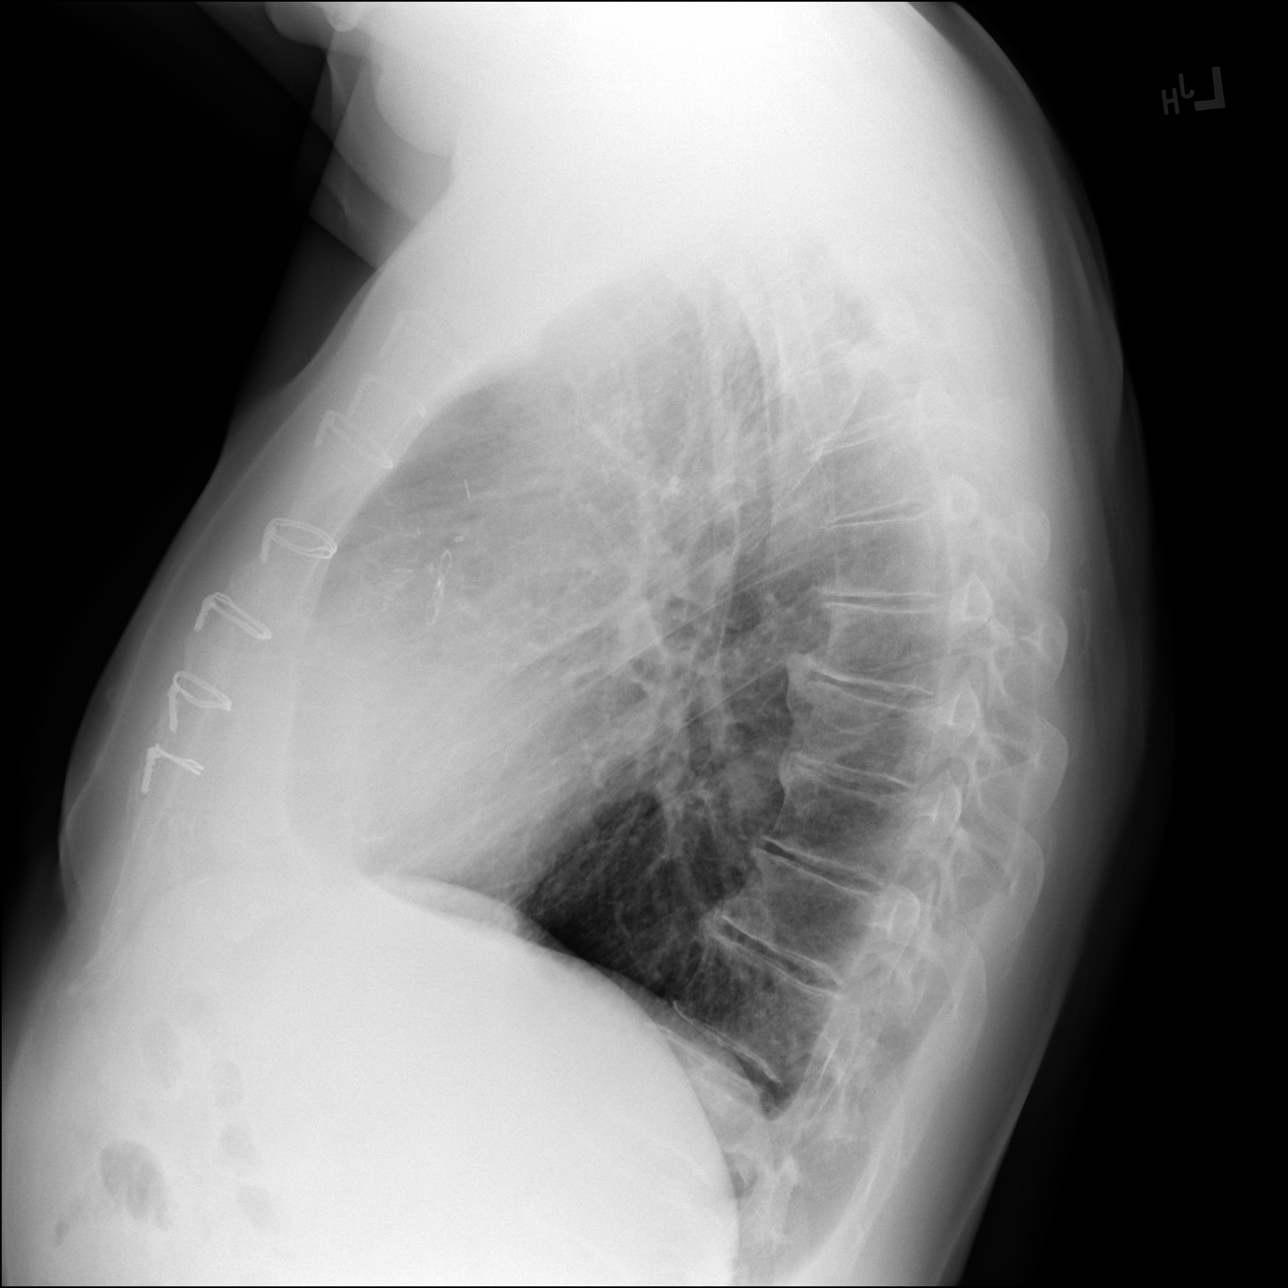

[3 of 3 positions shown; findings below may reference images not displayed]

FINDINGS: The previously noted left apical pneumothorax is smaller, with only
a slight residual focus of pneumothorax along the lateral left apex.
No tension component. There is no appreciable edema or
consolidation. The heart size and pulmonary vascularity are normal.
Patient is status post coronary artery bypass grafting. There is
aortic atherosclerosis. No adenopathy. There is postoperative change
in the lower cervical spine. There is degenerative change in the
thoracic spine.
IMPRESSION: Left apical pneumothorax is smaller compared to recent prior study
with only slight residual pneumothorax focus in the lateral left
apex. No tension component.

No edema or consolidation. Stable cardiac silhouette. Postoperative
changes noted. Aortic Atherosclerosis ([id number].D).

## 2021-04-29 ENCOUNTER — Other Ambulatory Visit: Payer: Self-pay | Admitting: Internal Medicine

## 2021-04-29 DIAGNOSIS — D6859 Other primary thrombophilia: Secondary | ICD-10-CM

## 2021-04-29 DIAGNOSIS — E785 Hyperlipidemia, unspecified: Secondary | ICD-10-CM

## 2021-04-29 DIAGNOSIS — I2510 Atherosclerotic heart disease of native coronary artery without angina pectoris: Secondary | ICD-10-CM

## 2021-05-02 ENCOUNTER — Ambulatory Visit (INDEPENDENT_AMBULATORY_CARE_PROVIDER_SITE_OTHER): Payer: Medicare Other | Admitting: Internal Medicine

## 2021-05-02 ENCOUNTER — Encounter: Payer: Self-pay | Admitting: Internal Medicine

## 2021-05-02 ENCOUNTER — Other Ambulatory Visit: Payer: Self-pay

## 2021-05-02 VITALS — BP 122/70 | HR 57 | Resp 18 | Ht 74.0 in | Wt 244.4 lb

## 2021-05-02 DIAGNOSIS — I10 Essential (primary) hypertension: Secondary | ICD-10-CM | POA: Diagnosis not present

## 2021-05-02 DIAGNOSIS — D6859 Other primary thrombophilia: Secondary | ICD-10-CM | POA: Diagnosis not present

## 2021-05-02 DIAGNOSIS — E785 Hyperlipidemia, unspecified: Secondary | ICD-10-CM | POA: Diagnosis not present

## 2021-05-02 DIAGNOSIS — N400 Enlarged prostate without lower urinary tract symptoms: Secondary | ICD-10-CM | POA: Diagnosis not present

## 2021-05-02 DIAGNOSIS — Z86718 Personal history of other venous thrombosis and embolism: Secondary | ICD-10-CM

## 2021-05-02 DIAGNOSIS — I2510 Atherosclerotic heart disease of native coronary artery without angina pectoris: Secondary | ICD-10-CM

## 2021-05-02 LAB — COMPREHENSIVE METABOLIC PANEL
ALT: 20 U/L (ref 0–53)
AST: 22 U/L (ref 0–37)
Albumin: 3.9 g/dL (ref 3.5–5.2)
Alkaline Phosphatase: 93 U/L (ref 39–117)
BUN: 18 mg/dL (ref 6–23)
CO2: 31 mEq/L (ref 19–32)
Calcium: 8.7 mg/dL (ref 8.4–10.5)
Chloride: 102 mEq/L (ref 96–112)
Creatinine, Ser: 0.94 mg/dL (ref 0.40–1.50)
GFR: 82.83 mL/min (ref >60.00)
Glucose, Bld: 75 mg/dL (ref 70–99)
Potassium: 4.6 mEq/L (ref 3.5–5.1)
Sodium: 138 mEq/L (ref 135–145)
Total Bilirubin: 0.4 mg/dL (ref 0.2–1.2)
Total Protein: 6.7 g/dL (ref 6.0–8.3)

## 2021-05-02 LAB — LIPID PANEL
Cholesterol: 141 mg/dL (ref 0–200)
HDL: 48.4 mg/dL (ref >39.00)
LDL Cholesterol: 81 mg/dL (ref 0–99)
NonHDL: 93.01
Total CHOL/HDL Ratio: 3
Triglycerides: 58 mg/dL (ref 0.0–149.0)
VLDL: 11.6 mg/dL (ref 0.0–40.0)

## 2021-05-02 LAB — CBC
HCT: 38.6 % — ABNORMAL LOW (ref 39.0–52.0)
Hemoglobin: 12.8 g/dL — ABNORMAL LOW (ref 13.0–17.0)
MCHC: 33.1 g/dL (ref 30.0–36.0)
MCV: 93.8 fl (ref 78.0–100.0)
Platelets: 208 10*3/uL (ref 150.0–400.0)
RBC: 4.11 Mil/uL — ABNORMAL LOW (ref 4.22–5.81)
RDW: 14 % (ref 11.5–15.5)
WBC: 4.9 10*3/uL (ref 4.0–10.5)

## 2021-05-02 LAB — PSA: PSA: 0.68 ng/mL (ref 0.10–4.00)

## 2021-05-02 MED ORDER — ROSUVASTATIN CALCIUM 10 MG PO TABS: 10.0000 mg | ORAL_TABLET | Freq: Every day | ORAL | 0 refills | Status: DC

## 2021-05-02 MED ORDER — RIVAROXABAN 10 MG PO TABS: 10.0000 mg | ORAL_TABLET | Freq: Every day | ORAL | 0 refills | Status: DC

## 2021-05-02 NOTE — Patient Instructions (Addendum)
Ask about the shingles vaccine.   We will check the labs today.

## 2021-05-02 NOTE — Progress Notes (Signed)
   Subjective:   Patient ID: Willie Black, male    DOB: March 09, 1951, 70 y.o.   MRN: [id number]  HPI The patient is a 70 YO man coming in for follow up.  Review of Systems  Constitutional: Negative.   HENT: Negative.    Eyes: Negative.   Respiratory:  Negative for cough, chest tightness and shortness of breath.   Cardiovascular:  Negative for chest pain, palpitations and leg swelling.  Gastrointestinal:  Negative for abdominal distention, abdominal pain, constipation, diarrhea, nausea and vomiting.  Musculoskeletal: Negative.   Skin: Negative.   Neurological: Negative.   Psychiatric/Behavioral: Negative.     Objective:  Physical Exam Constitutional:      Appearance: He is well-developed.  HENT:     Head: Normocephalic and atraumatic.  Cardiovascular:     Rate and Rhythm: Normal rate and regular rhythm.  Pulmonary:     Effort: Pulmonary effort is normal. No respiratory distress.     Breath sounds: Normal breath sounds. No wheezing or rales.  Abdominal:     General: Bowel sounds are normal. There is no distension.     Palpations: Abdomen is soft.     Tenderness: There is no abdominal tenderness. There is no rebound.  Musculoskeletal:     Cervical back: Normal range of motion.  Skin:    General: Skin is warm and dry.  Neurological:     Mental Status: He is alert and oriented to person, place, and time.     Coordination: Coordination normal.    Vitals:   05/02/21 0853  BP: 122/70  Pulse: (!) 57  Resp: 18  SpO2: 97%  Weight: 244 lb 6.4 oz (110.9 kg)  Height: 6\' 2"  (1.88 m)    This visit occurred during the SARS-CoV-2 public health emergency.  Safety protocols were in place, including screening questions prior to the visit, additional usage of staff PPE, and extensive cleaning of exam room while observing appropriate contact time as indicated for disinfecting solutions.   Assessment & Plan:

## 2021-05-04 ENCOUNTER — Encounter: Payer: Self-pay | Admitting: Internal Medicine

## 2021-05-04 NOTE — Assessment & Plan Note (Signed)
BP at goal on metoprolol 25 mg BID. Checking CMP and adjust as needed. HR okay.

## 2021-05-04 NOTE — Assessment & Plan Note (Signed)
Checking CBC and CMP. No signs clinically of GI bleeding. Refilled xarelto 10 mg daily.

## 2021-05-04 NOTE — Assessment & Plan Note (Signed)
Refilled crestor 10 mg daily. Checking lipid panel and adjust dosing as needed.

## 2021-05-04 NOTE — Assessment & Plan Note (Signed)
Checking PSA. Symptoms stable.

## 2021-05-15 ENCOUNTER — Other Ambulatory Visit: Payer: Self-pay | Admitting: Internal Medicine

## 2021-05-15 DIAGNOSIS — D508 Other iron deficiency anemias: Secondary | ICD-10-CM

## 2021-05-28 DIAGNOSIS — Z1211 Encounter for screening for malignant neoplasm of colon: Secondary | ICD-10-CM | POA: Diagnosis not present

## 2021-07-19 DIAGNOSIS — M47816 Spondylosis without myelopathy or radiculopathy, lumbar region: Secondary | ICD-10-CM | POA: Diagnosis not present

## 2021-07-19 DIAGNOSIS — G500 Trigeminal neuralgia: Secondary | ICD-10-CM | POA: Diagnosis not present

## 2021-07-19 DIAGNOSIS — G894 Chronic pain syndrome: Secondary | ICD-10-CM | POA: Diagnosis not present

## 2021-07-29 ENCOUNTER — Other Ambulatory Visit: Payer: Self-pay | Admitting: Internal Medicine

## 2021-07-29 DIAGNOSIS — D6859 Other primary thrombophilia: Secondary | ICD-10-CM

## 2021-07-30 ENCOUNTER — Other Ambulatory Visit: Payer: Self-pay | Admitting: Internal Medicine

## 2021-07-30 DIAGNOSIS — E785 Hyperlipidemia, unspecified: Secondary | ICD-10-CM

## 2021-07-30 DIAGNOSIS — I2510 Atherosclerotic heart disease of native coronary artery without angina pectoris: Secondary | ICD-10-CM

## 2021-09-27 ENCOUNTER — Other Ambulatory Visit: Payer: Self-pay | Admitting: Cardiology

## 2021-09-27 ENCOUNTER — Other Ambulatory Visit: Payer: Self-pay | Admitting: Internal Medicine

## 2021-09-27 DIAGNOSIS — D508 Other iron deficiency anemias: Secondary | ICD-10-CM

## 2021-10-08 ENCOUNTER — Other Ambulatory Visit: Payer: Self-pay | Admitting: Internal Medicine

## 2021-10-08 DIAGNOSIS — I714 Abdominal aortic aneurysm, without rupture, unspecified: Secondary | ICD-10-CM

## 2021-10-18 ENCOUNTER — Other Ambulatory Visit: Payer: Self-pay | Admitting: Internal Medicine

## 2021-10-18 DIAGNOSIS — E785 Hyperlipidemia, unspecified: Secondary | ICD-10-CM

## 2021-10-18 DIAGNOSIS — I2510 Atherosclerotic heart disease of native coronary artery without angina pectoris: Secondary | ICD-10-CM

## 2021-10-23 ENCOUNTER — Other Ambulatory Visit: Payer: Self-pay | Admitting: Cardiology

## 2021-10-24 ENCOUNTER — Other Ambulatory Visit: Payer: Self-pay | Admitting: Internal Medicine

## 2021-10-24 DIAGNOSIS — D6859 Other primary thrombophilia: Secondary | ICD-10-CM

## 2021-11-08 ENCOUNTER — Other Ambulatory Visit: Payer: Self-pay | Admitting: Cardiology

## 2021-11-08 DIAGNOSIS — M47816 Spondylosis without myelopathy or radiculopathy, lumbar region: Secondary | ICD-10-CM | POA: Diagnosis not present

## 2021-11-08 DIAGNOSIS — G894 Chronic pain syndrome: Secondary | ICD-10-CM | POA: Diagnosis not present

## 2021-11-08 DIAGNOSIS — G43809 Other migraine, not intractable, without status migrainosus: Secondary | ICD-10-CM | POA: Diagnosis not present

## 2021-11-08 DIAGNOSIS — G500 Trigeminal neuralgia: Secondary | ICD-10-CM | POA: Diagnosis not present

## 2021-11-15 ENCOUNTER — Ambulatory Visit (HOSPITAL_COMMUNITY)
Admission: RE | Admit: 2021-11-15 | Discharge: 2021-11-15 | Disposition: A | Discharge status: Home / Self Care | Payer: Medicare Other | Source: Ambulatory Visit | Attending: Internal Medicine | Admitting: Internal Medicine

## 2021-11-15 DIAGNOSIS — I714 Abdominal aortic aneurysm, without rupture, unspecified: Secondary | ICD-10-CM | POA: Insufficient documentation

## 2021-11-20 ENCOUNTER — Other Ambulatory Visit: Payer: Self-pay | Admitting: Cardiovascular Disease

## 2021-11-26 ENCOUNTER — Other Ambulatory Visit: Payer: Self-pay | Admitting: Internal Medicine

## 2021-11-26 DIAGNOSIS — D6859 Other primary thrombophilia: Secondary | ICD-10-CM

## 2021-12-03 ENCOUNTER — Encounter: Payer: Self-pay | Admitting: Internal Medicine

## 2021-12-03 ENCOUNTER — Ambulatory Visit: Payer: Medicare Other | Admitting: Internal Medicine

## 2021-12-03 VITALS — BP 110/60 | HR 67 | Temp 98.1°F | Ht 74.0 in | Wt 253.0 lb

## 2021-12-03 DIAGNOSIS — R9089 Other abnormal findings on diagnostic imaging of central nervous system: Secondary | ICD-10-CM | POA: Diagnosis not present

## 2021-12-03 DIAGNOSIS — G500 Trigeminal neuralgia: Secondary | ICD-10-CM

## 2021-12-03 DIAGNOSIS — I10 Essential (primary) hypertension: Secondary | ICD-10-CM | POA: Insufficient documentation

## 2021-12-03 DIAGNOSIS — D508 Other iron deficiency anemias: Secondary | ICD-10-CM | POA: Diagnosis not present

## 2021-12-03 DIAGNOSIS — R200 Anesthesia of skin: Secondary | ICD-10-CM | POA: Insufficient documentation

## 2021-12-03 DIAGNOSIS — D6859 Other primary thrombophilia: Secondary | ICD-10-CM

## 2021-12-03 LAB — CBC WITH DIFFERENTIAL/PLATELET
Basophils Absolute: 0.1 10*3/uL (ref 0.0–0.1)
Basophils Relative: 1.2 % (ref 0.0–3.0)
Eosinophils Absolute: 0.1 10*3/uL (ref 0.0–0.7)
Eosinophils Relative: 1.7 % (ref 0.0–5.0)
HCT: 39.7 % (ref 39.0–52.0)
Hemoglobin: 13 g/dL (ref 13.0–17.0)
Lymphocytes Relative: 24.8 % (ref 12.0–46.0)
Lymphs Abs: 1.2 10*3/uL (ref 0.7–4.0)
MCHC: 32.6 g/dL (ref 30.0–36.0)
MCV: 93.4 fl (ref 78.0–100.0)
Monocytes Absolute: 0.5 10*3/uL (ref 0.1–1.0)
Monocytes Relative: 10.3 % (ref 3.0–12.0)
Neutro Abs: 2.9 10*3/uL (ref 1.4–7.7)
Neutrophils Relative %: 62 % (ref 43.0–77.0)
Platelets: 210 10*3/uL (ref 150.0–400.0)
RBC: 4.25 Mil/uL (ref 4.22–5.81)
RDW: 14.7 % (ref 11.5–15.5)
WBC: 4.7 10*3/uL (ref 4.0–10.5)

## 2021-12-03 LAB — IBC + FERRITIN
Ferritin: 93.3 ng/mL (ref 22.0–322.0)
Iron: 66 ug/dL (ref 42–165)
Saturation Ratios: 23.8 % (ref 20.0–50.0)
TIBC: 277.2 ug/dL (ref 250.0–450.0)
Transferrin: 198 mg/dL — ABNORMAL LOW (ref 212.0–360.0)

## 2021-12-03 LAB — FOLATE: Folate: 17.3 ng/mL

## 2021-12-03 LAB — VITAMIN B12: Vitamin B-12: 435 pg/mL (ref 211–911)

## 2021-12-03 MED ORDER — RIVAROXABAN 10 MG PO TABS: 10.0000 mg | ORAL_TABLET | Freq: Every day | ORAL | 1 refills | Status: DC

## 2021-12-03 NOTE — Progress Notes (Signed)
Subjective:  Patient ID: Willie Black, male    DOB: 27-May-1951  Age: 71 y.o. MRN: [id number]  CC: Anemia   HPI [name] presents for f/up -  He has chronic, unchanged DOE but no CP, diaphoresis, or edema. He underwent gamma knife treatment for right trigeminal neuralgia years ago and complains of worsening right facial numbness and pain around his right eye.  Outpatient Medications Prior to Visit  Medication Sig Dispense Refill   Ascorbic Acid (VITAMIN C) 100 MG tablet Take 1 tablet (100 mg total) by mouth in the morning and at bedtime. 180 tablet 1   Cholecalciferol (VITAMIN D3 PO) Take 1 tablet by mouth daily.     Cyanocobalamin (VITAMIN B-12 PO) Take 1 capsule by mouth daily.     esomeprazole (NEXIUM) 20 MG capsule Take 20 mg by mouth daily at 12 noon.     ferrous sulfate 325 (65 FE) MG tablet TAKE 1 TABLET BY MOUTH 2 TIMES DAILY WITH A MEAL. 180 tablet 0   metoprolol tartrate (LOPRESSOR) 25 MG tablet TAKE 1 TABLET BY MOUTH TWICE A DAY (NEED OFFICE VISIT) 30 tablet 0   OXcarbazepine (TRILEPTAL) 150 MG tablet Take 75 mg by mouth daily.     Potassium 99 MG TABS Take 1 tablet by mouth daily.     rosuvastatin (CRESTOR) 10 MG tablet TAKE 1 TABLET BY MOUTH EVERY DAY 90 tablet 0   escitalopram (LEXAPRO) 20 MG tablet Take 20 mg by mouth daily.  1   rivaroxaban (XARELTO) 10 MG TABS tablet Take 1 tablet (10 mg total) by mouth daily. Annual appt due in June must see provider for future refills 30 tablet 0   No facility-administered medications prior to visit.    ROS Review of Systems  Constitutional:  Negative for chills, diaphoresis, fatigue and fever.  HENT: Negative.    Eyes: Negative.   Respiratory:  Positive for shortness of breath. Negative for cough, chest tightness and wheezing.   Cardiovascular:  Negative for chest pain, palpitations and leg swelling.  Gastrointestinal:  Negative for abdominal pain, diarrhea, nausea and vomiting.  Genitourinary: Negative.    Musculoskeletal: Negative.   Skin: Negative.   Neurological:  Positive for numbness. Negative for dizziness, seizures, facial asymmetry, weakness and light-headedness.  Hematological:  Negative for adenopathy. Does not bruise/bleed easily.  Psychiatric/Behavioral: Negative.      Objective:  BP 110/60 (BP Location: Left Arm, Patient Position: Sitting, Cuff Size: Large)   Pulse 67   Temp 98.1 F (36.7 C) (Oral)   Ht '6\' 2"'$  (1.88 m)   Wt 253 lb (114.8 kg)   SpO2 94%   BMI 32.48 kg/m   BP Readings from Last 3 Encounters:  12/03/21 110/60  05/02/21 122/70  05/10/20 126/80    Wt Readings from Last 3 Encounters:  12/03/21 253 lb (114.8 kg)  05/02/21 244 lb 6.4 oz (110.9 kg)  05/10/20 255 lb (115.7 kg)    Physical Exam Vitals reviewed.  HENT:     Nose: Nose normal.     Mouth/Throat:     Mouth: Mucous membranes are moist.  Eyes:     General: No scleral icterus.    Extraocular Movements: Extraocular movements intact.     Conjunctiva/sclera: Conjunctivae normal.     Pupils: Pupils are equal, round, and reactive to light.  Cardiovascular:     Rate and Rhythm: Normal rate and regular rhythm.     Heart sounds: No murmur heard.    Comments: EKG- NSR, 61  bpm ?LAE LAD - not new No ST/T wave changes, LVH, or Q waves Pulmonary:     Effort: Pulmonary effort is normal.     Breath sounds: No stridor. No wheezing, rhonchi or rales.  Abdominal:     General: Abdomen is flat.     Palpations: There is no mass.     Tenderness: There is no abdominal tenderness. There is no guarding.     Hernia: No hernia is present.  Musculoskeletal:     Cervical back: Neck supple.     Right lower leg: No edema.     Left lower leg: No edema.  Lymphadenopathy:     Cervical: No cervical adenopathy.  Skin:    General: Skin is warm and dry.  Neurological:     General: No focal deficit present.     Mental Status: He is alert. Mental status is at baseline.     Cranial Nerves: Cranial nerves 2-12 are  intact. No cranial nerve deficit or dysarthria.     Sensory: Sensation is intact.     Motor: Motor function is intact.  Psychiatric:        Mood and Affect: Mood normal.        Behavior: Behavior normal.     Lab Results  Component Value Date   WBC 4.7 12/03/2021   HGB 13.0 12/03/2021   HCT 39.7 12/03/2021   PLT 210.0 12/03/2021   GLUCOSE 75 05/02/2021   CHOL 141 05/02/2021   TRIG 58.0 05/02/2021   HDL 48.40 05/02/2021   LDLCALC 81 05/02/2021   ALT 20 05/02/2021   AST 22 05/02/2021   NA 138 05/02/2021   K 4.6 05/02/2021   CL 102 05/02/2021   CREATININE 0.94 05/02/2021   BUN 18 05/02/2021   CO2 31 05/02/2021   TSH 1.38 05/10/2020   PSA 0.68 05/02/2021   INR 0.97 11/22/2010    VAS [place] AAA DUPLEX  Result Date: 11/15/2021 ABDOMINAL AORTA STUDY Patient Name:  Willie Black  Date of Exam:   11/15/2021 Medical Rec #: [id number]             Accession #:    [id number] Date of Birth: 03/01/1951             Patient Gender: M Patient Age:   26 years Exam Location:  [facility] Procedure:      VAS [place] AAA DUPLEX Referring Phys: Scarlette Calico --------------------------------------------------------------------------------  Indications: Follow up exam for known AAA.  Comparison Study: 11/08/20: Distal aorta 2.82 x 3.19 cm Performing Technologist: Ralene Cork RVT  Examination Guidelines: A complete evaluation includes B-mode imaging, spectral Doppler, color Doppler, and power Doppler as needed of all accessible portions of each vessel. Bilateral testing is considered an integral part of a complete examination. Limited examinations for reoccurring indications may be performed as noted.  Abdominal Aorta Findings: +-----------+-------+----------+----------+--------+--------+--------+ Location   AP (cm)Trans (cm)PSV (cm/s)WaveformThrombusComments +-----------+-------+----------+----------+--------+--------+--------+ Proximal   2.37   2.77      72                                  +-----------+-------+----------+----------+--------+--------+--------+ Mid        2.11   2.29      85                                 +-----------+-------+----------+----------+--------+--------+--------+ Distal  3.00   3.46      71                                 +-----------+-------+----------+----------+--------+--------+--------+ RT CIA Prox1.4    1.4       126       biphasic                 +-----------+-------+----------+----------+--------+--------+--------+ LT CIA Prox1.3    1.4       107       biphasic                 +-----------+-------+----------+----------+--------+--------+--------+  Summary: Abdominal Aorta: There is evidence of abnormal dilatation of the distal Abdominal aorta. Previous diameter measurement was 2.8 x 3.19 cm obtained on 11/08/20.  *See table(s) above for measurements and observations.  Electronically signed by Orlie Pollen on 11/15/2021 at 5:03:23 PM.   Final     Assessment & Plan:   Peirce was seen today for anemia.  Diagnoses and all orders for this visit:  Iron deficiency anemia due to dietary causes- His H/H are normal now. -     IBC + Ferritin; Future -     CBC with Differential/Platelet; Future -     CBC with Differential/Platelet -     IBC + Ferritin  Other primary thrombophilia (HCC) -     rivaroxaban (XARELTO) 10 MG TABS tablet; Take 1 tablet (10 mg total) by mouth daily. Annual appt due in June must see provider for future refills  Hypertension, unspecified type- His BP is well controlled. -     EKG 12-Lead  Abnormal brain MRI -     MR Brain Wo Contrast; Future  Right facial numbness- Will recheck his MRI and will screen for vitamin deficiencies. -     Folate; Future -     Vitamin B12; Future -     Vitamin B1; Future -     Vitamin B1 -     Vitamin B12 -     Folate -     MR Brain Wo Contrast; Future  Trigeminal neuralgia of right side of face -     MR Brain Wo Contrast; Future   I have  discontinued Hakeem R. Fahl's escitalopram. I am also having him maintain his esomeprazole, Potassium, Cholecalciferol (VITAMIN D3 PO), Cyanocobalamin (VITAMIN B-12 PO), vitamin C, OXcarbazepine, ferrous sulfate, rosuvastatin, metoprolol tartrate, and rivaroxaban.  Meds ordered this encounter  Medications   rivaroxaban (XARELTO) 10 MG TABS tablet    Sig: Take 1 tablet (10 mg total) by mouth daily. Annual appt due in June must see provider for future refills    Dispense:  90 tablet    Refill:  1     Follow-up: Return in about 6 months (around 06/04/2022).  Scarlette Calico, MD

## 2021-12-03 NOTE — Patient Instructions (Signed)
Anemia  Anemia is a condition in which there is not enough red blood cells or hemoglobin in the blood. Hemoglobin is a substance in red blood cells that carries oxygen. When you do not have enough red blood cells or hemoglobin (are anemic), your body cannot get enough oxygen and your organs may not work properly. As a result, you may feel very tired or have other problems. What are the causes? Common causes of anemia include: Excessive bleeding. Anemia can be caused by excessive bleeding inside or outside the body, including bleeding from the intestines or from heavy menstrual periods in females. Poor nutrition. Long-lasting (chronic) kidney, thyroid, and liver disease. Bone marrow disorders, spleen problems, and blood disorders. Cancer and treatments for cancer. HIV (human immunodeficiency virus) and AIDS (acquired immunodeficiency syndrome). Infections, medicines, and autoimmune disorders that destroy red blood cells. What are the signs or symptoms? Symptoms of this condition include: Minor weakness. Dizziness. Headache, or difficulties concentrating and sleeping. Heartbeats that feel irregular or faster than normal (palpitations). Shortness of breath, especially with exercise. Pale skin, lips, and nails, or cold hands and feet. Indigestion and nausea. Symptoms may occur suddenly or develop slowly. If your anemia is mild, you may not have symptoms. How is this diagnosed? This condition is diagnosed based on blood tests, your medical history, and a physical exam. In some cases, a test may be needed in which cells are removed from the soft tissue inside of a bone and looked at under a microscope (bone marrow biopsy). Your health care provider may also check your stool (feces) for blood and may do additional testing to look for the cause of your bleeding. Other tests may include: Imaging tests, such as a CT scan or MRI. A procedure to see inside your esophagus and stomach (endoscopy). A  procedure to see inside your colon and rectum (colonoscopy). How is this treated? Treatment for this condition depends on the cause. If you continue to lose a lot of blood, you may need to be treated at a hospital. Treatment may include: Taking supplements of iron, vitamin B12, or folic acid. Taking a hormone medicine (erythropoietin) that can help to stimulate red blood cell growth. Having a blood transfusion. This may be needed if you lose a lot of blood. Making changes to your diet. Having surgery to remove your spleen. Follow these instructions at home: Take over-the-counter and prescription medicines only as told by your health care provider. Take supplements only as told by your health care provider. Follow any diet instructions that you were given by your health care provider. Keep all follow-up visits as told by your health care provider. This is important. Contact a health care provider if: You develop new bleeding anywhere in the body. Get help right away if: You are very weak. You are short of breath. You have pain in your abdomen or chest. You are dizzy or feel faint. You have trouble concentrating. You have bloody stools, black stools, or tarry stools. You vomit repeatedly or you vomit up blood. These symptoms may represent a serious problem that is an emergency. Do not wait to see if the symptoms will go away. Get medical help right away. Call your local emergency services (911 in the [place] Do not drive yourself to the hospital. Summary Anemia is a condition in which you do not have enough red blood cells or enough of a substance in your red blood cells that carries oxygen (hemoglobin). Symptoms may occur suddenly or develop slowly. If your anemia   is mild, you may not have symptoms. This condition is diagnosed with blood tests, a medical history, and a physical exam. Other tests may be needed. Treatment for this condition depends on the cause of the anemia. This  information is not intended to replace advice given to you by your health care provider. Make sure you discuss any questions you have with your health care provider. Document Revised: 04/24/2021 Document Reviewed: 05/18/2019 Elsevier Patient Education  2023 [employer]  

## 2021-12-05 ENCOUNTER — Telehealth: Payer: Medicare Other

## 2021-12-08 ENCOUNTER — Ambulatory Visit
Admission: RE | Admit: 2021-12-08 | Discharge: 2021-12-08 | Disposition: A | Discharge status: Home / Self Care | Payer: Medicare Other | Source: Ambulatory Visit | Attending: Internal Medicine | Admitting: Internal Medicine

## 2021-12-08 DIAGNOSIS — R9089 Other abnormal findings on diagnostic imaging of central nervous system: Secondary | ICD-10-CM

## 2021-12-08 DIAGNOSIS — G500 Trigeminal neuralgia: Secondary | ICD-10-CM

## 2021-12-08 DIAGNOSIS — R200 Anesthesia of skin: Secondary | ICD-10-CM

## 2021-12-08 DIAGNOSIS — R930 Abnormal findings on diagnostic imaging of skull and head, not elsewhere classified: Secondary | ICD-10-CM | POA: Diagnosis not present

## 2021-12-08 LAB — VITAMIN B1: Vitamin B1 (Thiamine): 13 nmol/L (ref 8–30)

## 2021-12-11 ENCOUNTER — Other Ambulatory Visit: Payer: Self-pay | Admitting: Internal Medicine

## 2021-12-11 DIAGNOSIS — G500 Trigeminal neuralgia: Secondary | ICD-10-CM

## 2021-12-11 DIAGNOSIS — R9089 Other abnormal findings on diagnostic imaging of central nervous system: Secondary | ICD-10-CM

## 2021-12-11 DIAGNOSIS — R200 Anesthesia of skin: Secondary | ICD-10-CM

## 2021-12-18 ENCOUNTER — Other Ambulatory Visit: Payer: Self-pay | Admitting: Cardiovascular Disease

## 2021-12-23 ENCOUNTER — Other Ambulatory Visit: Payer: Medicare Other

## 2021-12-26 ENCOUNTER — Other Ambulatory Visit: Payer: Self-pay | Admitting: Internal Medicine

## 2021-12-26 DIAGNOSIS — D508 Other iron deficiency anemias: Secondary | ICD-10-CM

## 2021-12-27 ENCOUNTER — Ambulatory Visit
Admission: RE | Admit: 2021-12-27 | Discharge: 2021-12-27 | Disposition: A | Discharge status: Home / Self Care | Payer: Medicare Other | Source: Ambulatory Visit | Attending: Internal Medicine | Admitting: Internal Medicine

## 2021-12-27 DIAGNOSIS — G500 Trigeminal neuralgia: Secondary | ICD-10-CM | POA: Diagnosis not present

## 2021-12-27 DIAGNOSIS — R200 Anesthesia of skin: Secondary | ICD-10-CM

## 2021-12-27 DIAGNOSIS — R9089 Other abnormal findings on diagnostic imaging of central nervous system: Secondary | ICD-10-CM

## 2021-12-27 DIAGNOSIS — R9402 Abnormal brain scan: Secondary | ICD-10-CM | POA: Diagnosis not present

## 2021-12-27 MED ORDER — GADOBENATE DIMEGLUMINE 529 MG/ML IV SOLN
20.0000 mL | Freq: Once | INTRAVENOUS | Status: AC | PRN
  Administered 2021-12-27: 20 mL via INTRAVENOUS

## 2021-12-28 ENCOUNTER — Other Ambulatory Visit: Payer: Self-pay | Admitting: Cardiology

## 2021-12-31 ENCOUNTER — Other Ambulatory Visit: Payer: Self-pay | Admitting: Internal Medicine

## 2021-12-31 DIAGNOSIS — I2510 Atherosclerotic heart disease of native coronary artery without angina pectoris: Secondary | ICD-10-CM

## 2021-12-31 DIAGNOSIS — E785 Hyperlipidemia, unspecified: Secondary | ICD-10-CM

## 2022-01-08 ENCOUNTER — Other Ambulatory Visit: Payer: Self-pay | Admitting: Cardiology

## 2022-01-30 ENCOUNTER — Other Ambulatory Visit: Payer: Self-pay | Admitting: Cardiology

## 2022-01-31 ENCOUNTER — Telehealth: Payer: Self-pay | Admitting: Cardiology

## 2022-01-31 NOTE — Telephone Encounter (Signed)
*  STAT* If patient is at the pharmacy, call can be transferred to refill team.   1. Which medications need to be refilled? (please list name of each medication and dose if known)   metoprolol tartrate (LOPRESSOR) 25 MG tablet  2. Which pharmacy/location (including street and city if local pharmacy) is medication to be sent to?  [id number]- [place], [place] - [address]  3. Do they need a 30 day or 90 day supply? 90 day  Patient stated he has enough medication through mid next week.  Patient has appointment scheduled on 02/28/22.

## 2022-02-01 MED ORDER — METOPROLOL TARTRATE 25 MG PO TABS: 25.0000 mg | ORAL_TABLET | Freq: Two times a day (BID) | ORAL | 0 refills | Status: DC

## 2022-02-06 ENCOUNTER — Other Ambulatory Visit: Payer: Self-pay

## 2022-02-06 NOTE — Patient Outreach (Signed)
  Care Coordination   Initial Visit Note   02/06/2022 Name: Willie Black MRN: [id number] DOB: 1951/03/01  Willie Black is a 71 y.o. year old male who sees Janith Lima, MD for primary care. I spoke with  Conan Bowens by phone today  What matters to the patients health and wellness today?  Patient denies any care coordination needs or concerns at this time.    Goals Addressed             This Visit's Progress    COMPLETED: Health Maintanence       Care Coordination Interventions: Advised patient to contact primary care provider to schedule Annual Wellness Visit Reviewed care gaps Care coordination with primary care provider as needed         SDOH assessments and interventions completed:  Yes  SDOH Interventions Today    Flowsheet Row Most Recent Value  SDOH Interventions   Food Insecurity Interventions Intervention Not Indicated  Transportation Interventions Intervention Not Indicated        Care Coordination Interventions Activated:  Yes  Care Coordination Interventions:  Yes, provided   Follow up plan: No further intervention required.   Encounter Outcome:  Pt. Visit Completed   Thea Silversmith, RN, MSN, BSN, CCM Care  Coordinator [phone]

## 2022-02-07 DIAGNOSIS — G500 Trigeminal neuralgia: Secondary | ICD-10-CM | POA: Diagnosis not present

## 2022-02-07 DIAGNOSIS — G894 Chronic pain syndrome: Secondary | ICD-10-CM | POA: Diagnosis not present

## 2022-02-07 DIAGNOSIS — G43809 Other migraine, not intractable, without status migrainosus: Secondary | ICD-10-CM | POA: Diagnosis not present

## 2022-02-07 DIAGNOSIS — M47816 Spondylosis without myelopathy or radiculopathy, lumbar region: Secondary | ICD-10-CM | POA: Diagnosis not present

## 2022-02-10 ENCOUNTER — Other Ambulatory Visit: Payer: Self-pay | Admitting: Cardiology

## 2022-02-13 ENCOUNTER — Ambulatory Visit: Payer: Medicare Other | Admitting: Cardiology

## 2022-02-23 NOTE — Progress Notes (Signed)
Cardiology Office Note:    Date:  02/28/2022   ID:  Willie Black, DOB 09-12-1950, MRN [id number]  PCP:  Janith Lima, MD   [place] Providers Cardiologist:  Peter Martinique, MD Cardiology APP:  [place], [place] { Referring MD: Janith Lima, MD   Chief Complaint  Patient presents with   Follow-up    CABG    History of Present Illness:    Willie Black is a 71 y.o. male with a hx of CAD s/p CABG in 2011 with LIMA-LAD, SVG-OM, and SVG-PDA, hypertension, hyperlipidemia, and PVCs.  He has been intolerant to multiple statins in the past.  Nuclear stress test in 2017 was low risk and his ejection fraction was normal.  He developed acute L LE DVT in 2019 and was started on Xarelto by his PCP.  His cholesterol is followed by his PCP.  He has a history of trigeminal neuralgia and underwent gamma knife surgery at [facility] 08/2018 with some improvement.  He had a spontaneous left pneumothorax 03/18/2019 and underwent left VATS 03/24/2019.  He has right subclavian stenosis but is asymptomatic.  He has a AAA of 3.1 cm (09/2019).  He was last seen by Dr. Martinique 10/2019 and was dong well at that time. He is on 10 mg crestor and is not able to tolerate higher doses.   He presents for 2 year follow up.  He is on 10 mg xarelto and has been since is VATS, per CT surgery.   He can walk 2 miles on the treadmill three times per week. 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 He also plays golf. He has no cardiac complaints.  He will need an upcoming colonoscopy.   Past Medical History:  Diagnosis Date   Anxiety    CAD (coronary artery disease)    a. s/p CABG in 2011 with LIMA-LAD, SVG-OM, and SVG-PDA b. low-risk NST in 08/2015   GERD (gastroesophageal reflux disease)    Heart attack ([place]) 2011   Hypercholesteremia    Pneumothorax 03/18/2019   LEFT SIDE   PVC (premature ventricular contraction)    Bigeminy PVC's on event monitor Nov 2012   Renal  calculi    Shingles    Trigeminal neuralgia of right side of face     Past Surgical History:  Procedure Laterality Date   CERVICAL DISC SURGERY     CORONARY ARTERY BYPASS GRAFT  05/28/2110   x3 with LIMA to LAD, SVG to OM and SVG to LAD after having ST elevation with coming off pump   KNEE ARTHROSCOPY     NECK SURGERY     STAPLING OF BLEBS Left 03/24/2019   Procedure: STAPLING OF BLEBS;  Surgeon: Gaye Pollack, MD;  Location: [place];  Service: Thoracic;  Laterality: Left;   VIDEO ASSISTED THORACOSCOPY Left 03/24/2019   Procedure: VIDEO ASSISTED THORACOSCOPY;  Surgeon: Gaye Pollack, MD;  Location: [facility];  Service: Thoracic;  Laterality: Left;    Current Medications: Current Meds  Medication Sig   Ascorbic Acid (VITAMIN C) 100 MG tablet Take 1 tablet (100 mg total) by mouth in the morning and at bedtime.   Cholecalciferol (VITAMIN D3 PO) Take 1 tablet by mouth daily.   esomeprazole (NEXIUM) 20 MG capsule Take 20 mg by mouth daily at 12 noon.   ferrous sulfate 325 (65 FE) MG tablet TAKE 1 TABLET BY MOUTH TWICE A DAY WITH MEALS   metoprolol succinate (  TOPROL XL) 25 MG 24 hr tablet Take 1 tablet (25 mg total) by mouth daily.   OXcarbazepine (TRILEPTAL) 150 MG tablet Take 75 mg by mouth daily.   Potassium 99 MG TABS Take 1 tablet by mouth daily.   rivaroxaban (XARELTO) 10 MG TABS tablet Take 1 tablet (10 mg total) by mouth daily. Annual appt due in June must see provider for future refills   rosuvastatin (CRESTOR) 10 MG tablet TAKE 1 TABLET BY MOUTH EVERY DAY   [DISCONTINUED] metoprolol tartrate (LOPRESSOR) 25 MG tablet TAKE 1 TABLET BY MOUTH 2 (TWO) TIMES DAILY. KEEP UPCOMING APPOINTMENT FOR FUTURE REFILLS.     Allergies:   Penicillins   Social History   Socioeconomic History   Marital status: Married    Spouse name: Not on file   Number of children: 2   Years of education: 12   Highest education level: Not on file  Occupational History   Occupation: Scientist, clinical (histocompatibility and immunogenetics):  [employer]  Tobacco Use   Smoking status: Former    Types: Cigarettes    Quit date: 04/27/2009    Years since quitting: 12.8   Smokeless tobacco: Never  Vaping Use   Vaping Use: Never used  Substance and Sexual Activity   Alcohol use: No   Drug use: No   Sexual activity: Yes  Other Topics Concern   Not on file  Social History Narrative   Lives at home with his wife.   Right-handed.   2 cups caffeine daily.   Social Determinants of Health   Financial Resource Strain: Low Risk  (12/07/2020)   Overall Financial Resource Strain (CARDIA)    Difficulty of Paying Living Expenses: Not very hard  Food Insecurity: No Food Insecurity (02/06/2022)   Hunger Vital Sign    Worried About Running Out of Food in the Last Year: Never true    Ran Out of Food in the Last Year: Never true  Transportation Needs: No Transportation Needs (02/06/2022)   PRAPARE - Hydrologist (Medical): No    Lack of Transportation (Non-Medical): No  Physical Activity: Not on file  Stress: Not on file  Social Connections: Not on file     Family History: The patient's family history includes Aneurysm in his father; Coronary artery disease in his father; Heart failure in his mother.  ROS:   Please see the history of present illness.     All other systems reviewed and are negative.  EKGs/Labs/Other Studies Reviewed:    The following studies were reviewed today:  Nuclear stress test 2017: The left ventricular ejection fraction is normal (55-65%). Nuclear stress EF: 63%. Blood pressure demonstrated a hypertensive response to exercise. Upsloping ST segment depression ST segment depression was noted during stress in the II, III and aVF leads. The study is normal. This is a low risk study.  EKG:  EKG is not ordered today.   Recent Labs: 05/02/2021: ALT 20; BUN 18; Creatinine, Ser 0.94; Potassium 4.6; Sodium 138 12/03/2021: Hemoglobin 13.0; Platelets 210.0  Recent Lipid  Panel    Component Value Date/Time   CHOL 141 05/02/2021 0912   CHOL 134 10/26/2019 0853   TRIG 58.0 05/02/2021 0912   HDL 48.40 05/02/2021 0912   HDL 48 10/26/2019 0853   CHOLHDL 3 05/02/2021 0912   VLDL 11.6 05/02/2021 0912   LDLCALC 81 05/02/2021 0912   LDLCALC 74 10/26/2019 0853     Risk Assessment/Calculations:  Physical Exam:    VS:  BP 112/60   Pulse 81   Ht '6\' 2"'$  (1.88 m)   Wt 242 lb 12.8 oz (110.1 kg)   SpO2 98%   BMI 31.17 kg/m     Wt Readings from Last 3 Encounters:  02/28/22 242 lb 12.8 oz (110.1 kg)  12/03/21 253 lb (114.8 kg)  05/02/21 244 lb 6.4 oz (110.9 kg)     GEN:  Well nourished, well developed in no acute distress HEENT: Normal NECK: No JVD; No carotid bruits LYMPHATICS: No lymphadenopathy CARDIAC: RRR, no murmurs, rubs, gallops RESPIRATORY:  Clear to auscultation without rales, wheezing or rhonchi  ABDOMEN: Soft, non-tender, non-distended MUSCULOSKELETAL:  No edema; No deformity  SKIN: Warm and dry NEUROLOGIC:  Alert and oriented x 3 PSYCHIATRIC:  Normal affect   ASSESSMENT:    1. Coronary artery disease involving native coronary artery of native heart without angina pectoris   2. Hx of CABG   3. Hyperlipidemia LDL goal <70   4. Statin intolerance   5. Essential hypertension   6. Stenosis of right subclavian artery ([place])   7. PVC's (premature ventricular contractions)   8. Abdominal aortic aneurysm (AAA) without rupture, unspecified part ([place])   9. Preoperative cardiovascular examination    PLAN:    In order of problems listed above:  CAD s/p CABG x 3 Nonischemic nuclear stress test 2017 Continue ASA, lopressor, and low dose statin No chest pain, he remains active Consider surveillance PET, will defer to Dr. Martinique   Hyperlipidemia with LDL goal < 70 Statin intolerance Can tolerate 10 mg crestor Followed by PCP 05/02/2021: Cholesterol 141; HDL 48.40; LDL Cholesterol 81; Triglycerides 58.0; VLDL 11.6 Will  recheck this today May need lipid clinic if LDL above    Hypertension Right subclavian stenosis Check in left arm only   PVCs Heart monitor showed ventricular bigeminy No palpitations Continue BB   AAA 11/15/21: distal aorta now measures 3.00 x 3.46 cm 10/2020: distal aorta measured 2.8 x 3.19 cm No indication for surgery referral until greater than 4.0 cm - followed by PCP   Preoperative evaluation for risk of MACE during colonoscopy He hs a history of heart disease and AAA. Both of these are stable. He can complete more than 4.0 METS without angina. He is at acceptable risk to proceed with colonoscopy. We do not manage xarelto, would reach out to PCP for [facility] hold.     Follow up with Dr. Martinique in 1 year, sooner if needed.       Medication Adjustments/Labs and Tests Ordered: Current medicines are reviewed at length with the patient today.  Concerns regarding medicines are outlined above.  Orders Placed This Encounter  Procedures   Lipid panel   Hepatic function panel   LDL cholesterol, direct   Meds ordered this encounter  Medications   metoprolol succinate (TOPROL XL) 25 MG 24 hr tablet    Sig: Take 1 tablet (25 mg total) by mouth daily.    Dispense:  90 tablet    Refill:  3    Patient Instructions  Medication Instructions:  Your physician has recommended you make the following change in your medication:  START: Metoprolol Succinate '25mg'$  daily.  STOP: Metoprolol Tartrate '25mg'$   *If you need a refill on your cardiac medications before your next appointment, please call your pharmacy*   Lab Work: Lipid Panel, LFT's and Direct LDL If you have labs (blood work) drawn today and your tests are completely normal, you will  receive your results only by: [place] (if you have MyChart) OR A paper copy in the mail If you have any lab test that is abnormal or we need to change your treatment, we will call you to review the  results.   Testing/Procedures: NONE   Follow-Up: At [facility], you and your health needs are our priority.  As part of our continuing mission to provide you with exceptional heart care, we have created designated Provider Care Teams.  These Care Teams include your primary Cardiologist (physician) and Advanced Practice Providers (APPs -  Physician Assistants and Nurse Practitioners) who all work together to provide you with the care you need, when you need it.  We recommend signing up for the patient portal called "MyChart".  Sign up information is provided on this After Visit Summary.  MyChart is used to connect with patients for Virtual Visits (Telemedicine).  Patients are able to view lab/test results, encounter notes, upcoming appointments, etc.  Non-urgent messages can be sent to your provider as well.   To learn more about what you can do with MyChart, go to [email or web address].    Your next appointment:   1 year(s)  The format for your next appointment:   In Person  Provider:   Peter Martinique, MD      Signed, [place], [place]  02/28/2022 9:35 AM    [place]

## 2022-02-26 ENCOUNTER — Other Ambulatory Visit: Payer: Self-pay | Admitting: Cardiology

## 2022-02-28 ENCOUNTER — Encounter: Payer: Self-pay | Admitting: Physician Assistant

## 2022-02-28 ENCOUNTER — Ambulatory Visit: Payer: Medicare Other | Attending: Cardiology | Admitting: Physician Assistant

## 2022-02-28 VITALS — BP 112/60 | HR 81 | Ht 74.0 in | Wt 242.8 lb

## 2022-02-28 DIAGNOSIS — Z789 Other specified health status: Secondary | ICD-10-CM

## 2022-02-28 DIAGNOSIS — I10 Essential (primary) hypertension: Secondary | ICD-10-CM | POA: Diagnosis not present

## 2022-02-28 DIAGNOSIS — I493 Ventricular premature depolarization: Secondary | ICD-10-CM | POA: Diagnosis not present

## 2022-02-28 DIAGNOSIS — Z01810 Encounter for preprocedural cardiovascular examination: Secondary | ICD-10-CM

## 2022-02-28 DIAGNOSIS — E785 Hyperlipidemia, unspecified: Secondary | ICD-10-CM | POA: Diagnosis not present

## 2022-02-28 DIAGNOSIS — I771 Stricture of artery: Secondary | ICD-10-CM | POA: Diagnosis not present

## 2022-02-28 DIAGNOSIS — Z951 Presence of aortocoronary bypass graft: Secondary | ICD-10-CM

## 2022-02-28 DIAGNOSIS — I2510 Atherosclerotic heart disease of native coronary artery without angina pectoris: Secondary | ICD-10-CM

## 2022-02-28 DIAGNOSIS — I714 Abdominal aortic aneurysm, without rupture, unspecified: Secondary | ICD-10-CM

## 2022-02-28 MED ORDER — METOPROLOL SUCCINATE ER 25 MG PO TB24: 25.0000 mg | ORAL_TABLET | Freq: Every day | ORAL | 3 refills | Status: DC

## 2022-02-28 NOTE — Patient Instructions (Signed)
Medication Instructions:  Your physician has recommended you make the following change in your medication:  START: Metoprolol Succinate '25mg'$  daily.  STOP: Metoprolol Tartrate '25mg'$   *If you need a refill on your cardiac medications before your next appointment, please call your pharmacy*   Lab Work: Lipid Panel, LFT's and Direct LDL If you have labs (blood work) drawn today and your tests are completely normal, you will receive your results only by: [place] (if you have MyChart) OR A paper copy in the mail If you have any lab test that is abnormal or we need to change your treatment, we will call you to review the results.   Testing/Procedures: NONE   Follow-Up: At [facility], you and your health needs are our priority.  As part of our continuing mission to provide you with exceptional heart care, we have created designated Provider Care Teams.  These Care Teams include your primary Cardiologist (physician) and Advanced Practice Providers (APPs -  Physician Assistants and Nurse Practitioners) who all work together to provide you with the care you need, when you need it.  We recommend signing up for the patient portal called "MyChart".  Sign up information is provided on this After Visit Summary.  MyChart is used to connect with patients for Virtual Visits (Telemedicine).  Patients are able to view lab/test results, encounter notes, upcoming appointments, etc.  Non-urgent messages can be sent to your provider as well.   To learn more about what you can do with MyChart, go to [email or web address].    Your next appointment:   1 year(s)  The format for your next appointment:   In Person  Provider:   Peter Martinique, MD

## 2022-03-01 LAB — LIPID PANEL
Chol/HDL Ratio: 2.9 ratio (ref 0.0–5.0)
Cholesterol, Total: 136 mg/dL (ref 100–199)
HDL: 47 mg/dL (ref >39)
LDL Chol Calc (NIH): 76 mg/dL (ref 0–99)
Triglycerides: 65 mg/dL (ref 0–149)
VLDL Cholesterol Cal: 13 mg/dL (ref 5–40)

## 2022-03-01 LAB — HEPATIC FUNCTION PANEL
ALT: 10 IU/L (ref 0–44)
AST: 18 IU/L (ref 0–40)
Albumin: 4.3 g/dL (ref 3.9–4.9)
Alkaline Phosphatase: 110 IU/L (ref 44–121)
Bilirubin Total: <0.2 mg/dL (ref 0.0–1.2)
Bilirubin, Direct: 0.11 mg/dL (ref 0.00–0.40)
Total Protein: 6.9 g/dL (ref 6.0–8.5)

## 2022-03-01 LAB — LDL CHOLESTEROL, DIRECT: LDL Direct: 74 mg/dL (ref 0–99)

## 2022-03-07 ENCOUNTER — Other Ambulatory Visit: Payer: Self-pay | Admitting: Cardiology

## 2022-03-26 ENCOUNTER — Ambulatory Visit (INDEPENDENT_AMBULATORY_CARE_PROVIDER_SITE_OTHER): Payer: Medicare Other | Admitting: Family Medicine

## 2022-03-26 ENCOUNTER — Encounter: Payer: Self-pay | Admitting: Family Medicine

## 2022-03-26 VITALS — BP 154/80 | HR 62 | Temp 97.8°F | Ht 74.0 in | Wt 244.0 lb

## 2022-03-26 DIAGNOSIS — L282 Other prurigo: Secondary | ICD-10-CM | POA: Diagnosis not present

## 2022-03-26 DIAGNOSIS — L309 Dermatitis, unspecified: Secondary | ICD-10-CM | POA: Diagnosis not present

## 2022-03-26 MED ORDER — TRIAMCINOLONE ACETONIDE 0.025 % EX OINT: 1.0000 | TOPICAL_OINTMENT | Freq: Two times a day (BID) | CUTANEOUS | 0 refills | Status: DC

## 2022-03-26 MED ORDER — METHYLPREDNISOLONE ACETATE 80 MG/ML IJ SUSP
80.0000 mg | Freq: Once | INTRAMUSCULAR | Status: AC
  Administered 2022-03-26: 80 mg via INTRAMUSCULAR

## 2022-03-26 NOTE — Progress Notes (Signed)
Subjective:     Patient ID: Willie Black, male    DOB: 1951-02-10, 71 y.o.   MRN: [id number]  Chief Complaint  Patient presents with   Poison Ivy    2 weeks ago rash started but has continued to get worse. Rash in multiple areas of body including neck, arms, abdomen, legs.     HPI Patient is in today for a pruritic rash x 2 weeks on his arms and abdomen. He was pulling weeds in his yard. His wife also had allergic reaction.   Health Maintenance Due  Topic Date Due   COLON CANCER SCREENING ANNUAL FOBT  02/18/2020    Past Medical History:  Diagnosis Date   Anxiety    CAD (coronary artery disease)    a. s/p CABG in 2011 with LIMA-LAD, SVG-OM, and SVG-PDA b. low-risk NST in 08/2015   GERD (gastroesophageal reflux disease)    Heart attack ([place]) 2011   Hypercholesteremia    Pneumothorax 03/18/2019   LEFT SIDE   PVC (premature ventricular contraction)    Bigeminy PVC's on event monitor Nov 2012   Renal calculi    Shingles    Trigeminal neuralgia of right side of face     Past Surgical History:  Procedure Laterality Date   CERVICAL DISC SURGERY     CORONARY ARTERY BYPASS GRAFT  05/28/2110   x3 with LIMA to LAD, SVG to OM and SVG to LAD after having ST elevation with coming off pump   KNEE ARTHROSCOPY     NECK SURGERY     STAPLING OF BLEBS Left 03/24/2019   Procedure: STAPLING OF BLEBS;  Surgeon: Gaye Pollack, MD;  Location: [place];  Service: Thoracic;  Laterality: Left;   VIDEO ASSISTED THORACOSCOPY Left 03/24/2019   Procedure: VIDEO ASSISTED THORACOSCOPY;  Surgeon: Gaye Pollack, MD;  Location: [facility];  Service: Thoracic;  Laterality: Left;    Family History  Problem Relation Age of Onset   Aneurysm Father        abdominal   Coronary artery disease Father        3 stents   Heart failure Mother     Social History   Socioeconomic History   Marital status: Married    Spouse name: Not on file   Number of children: 2   Years of education: 12   Highest  education level: Not on file  Occupational History   Occupation: Scientist, clinical (histocompatibility and immunogenetics): [employer]  Tobacco Use   Smoking status: Former    Types: Cigarettes    Quit date: 04/27/2009    Years since quitting: 12.9   Smokeless tobacco: Never  Vaping Use   Vaping Use: Never used  Substance and Sexual Activity   Alcohol use: No   Drug use: No   Sexual activity: Yes  Other Topics Concern   Not on file  Social History Narrative   Lives at home with his wife.   Right-handed.   2 cups caffeine daily.   Social Determinants of Health   Financial Resource Strain: Low Risk  (12/07/2020)   Overall Financial Resource Strain (CARDIA)    Difficulty of Paying Living Expenses: Not very hard  Food Insecurity: No Food Insecurity (02/06/2022)   Hunger Vital Sign    Worried About Running Out of Food in the Last Year: Never true    Ran Out of Food in the Last Year: Never true  Transportation Needs: No Transportation Needs (02/06/2022)   PRAPARE -  Hydrologist (Medical): No    Lack of Transportation (Non-Medical): No  Physical Activity: Not on file  Stress: Not on file  Social Connections: Not on file  Intimate Partner Violence: Not on file    Outpatient Medications Prior to Visit  Medication Sig Dispense Refill   Ascorbic Acid (VITAMIN C) 100 MG tablet Take 1 tablet (100 mg total) by mouth in the morning and at bedtime. 180 tablet 1   Cholecalciferol (VITAMIN D3 PO) Take 1 tablet by mouth daily.     Cyanocobalamin (VITAMIN B-12 PO) Take 1 capsule by mouth daily.     esomeprazole (NEXIUM) 20 MG capsule Take 20 mg by mouth daily at 12 noon.     ferrous sulfate 325 (65 FE) MG tablet TAKE 1 TABLET BY MOUTH TWICE A DAY WITH MEALS 180 tablet 0   metoprolol succinate (TOPROL XL) 25 MG 24 hr tablet Take 1 tablet (25 mg total) by mouth daily. 90 tablet 3   OXcarbazepine (TRILEPTAL) 150 MG tablet Take 75 mg by mouth daily.     Potassium 99 MG TABS Take 1 tablet by  mouth daily.     rivaroxaban (XARELTO) 10 MG TABS tablet Take 1 tablet (10 mg total) by mouth daily. Annual appt due in June must see provider for future refills 90 tablet 1   rosuvastatin (CRESTOR) 10 MG tablet TAKE 1 TABLET BY MOUTH EVERY DAY 90 tablet 0   No facility-administered medications prior to visit.    Allergies  Allergen Reactions   Penicillins Rash    Did it involve swelling of the face/tongue/throat, SOB, or low BP? Unknown Did it involve sudden or severe rash/hives, skin peeling, or any reaction on the inside of your mouth or nose? Unknown Did you need to seek medical attention at a hospital or doctor's office? Unknown When did it last happen?      childhood If all above answers are "NO", may proceed with cephalosporin use.     ROS     Objective:    Physical Exam Constitutional:      General: He is not in acute distress.    Appearance: He is not ill-appearing.  HENT:     Mouth/Throat:     Mouth: Mucous membranes are moist.  Eyes:     Conjunctiva/sclera: Conjunctivae normal.  Cardiovascular:     Rate and Rhythm: Normal rate.  Pulmonary:     Effort: Pulmonary effort is normal.  Skin:    General: Skin is warm and dry.     Findings: Rash present.          Comments: Pruritic red, raised rash on left upper arm, left lower arm and wrist and abdomen. No drainage, induration or sign of secondary bacterial infection. Some scabbing   Neurological:     General: No focal deficit present.     Mental Status: He is alert and oriented to person, place, and time.  Psychiatric:        Mood and Affect: Mood normal.        Behavior: Behavior normal.     BP (!) 154/80 (BP Location: Left Arm, Patient Position: Sitting, Cuff Size: Large)   Pulse 62   Temp 97.8 F (36.6 C) (Temporal)   Ht '6\' 2"'$  (1.88 m)   Wt 244 lb (110.7 kg)   SpO2 97%   BMI 31.33 kg/m  Wt Readings from Last 3 Encounters:  03/26/22 244 lb (110.7 kg)  02/28/22 242 lb 12.8 oz (  110.1 kg)  12/03/21  253 lb (114.8 kg)       Assessment & Plan:   Problem List Items Addressed This Visit   None Visit Diagnoses     Dermatitis    -  Primary   Relevant Medications   methylPREDNISolone acetate (DEPO-MEDROL) injection 80 mg (Completed)   triamcinolone (KENALOG) 0.025 % ointment   Pruritic rash       Relevant Medications   methylPREDNISolone acetate (DEPO-MEDROL) injection 80 mg (Completed)   triamcinolone (KENALOG) 0.025 % ointment      Depo-medrol 80 mg IM injection given in office.  Triamcinolone ointment prescribed.  May take Benadryl at bedtime for itching.  Follow up if any sign of infection.   I am having Carzell R. Raney start on triamcinolone. I am also having him maintain his esomeprazole, Potassium, Cholecalciferol (VITAMIN D3 PO), Cyanocobalamin (VITAMIN B-12 PO), vitamin C, OXcarbazepine, rivaroxaban, ferrous sulfate, rosuvastatin, and metoprolol succinate. We administered methylPREDNISolone acetate.  Meds ordered this encounter  Medications   methylPREDNISolone acetate (DEPO-MEDROL) injection 80 mg   triamcinolone (KENALOG) 0.025 % ointment    Sig: Apply 1 Application topically 2 (two) times daily.    Dispense:  30 g    Refill:  0    Order Specific Question:   Supervising Provider    Answer:   Pricilla Holm A [[id number]]

## 2022-03-26 NOTE — Patient Instructions (Signed)
You received a steroid injection today, Depo-Medrol 80 mg IM.   I also prescribed a topical steroid to use on the rash for the next 1-2 weeks.   You may take Benadryl as needed at bedtime to help you sleep and keep from itching.   Follow up if not improving.

## 2022-04-18 ENCOUNTER — Other Ambulatory Visit: Payer: Self-pay | Admitting: Internal Medicine

## 2022-04-18 DIAGNOSIS — E785 Hyperlipidemia, unspecified: Secondary | ICD-10-CM

## 2022-04-18 DIAGNOSIS — I2510 Atherosclerotic heart disease of native coronary artery without angina pectoris: Secondary | ICD-10-CM

## 2022-04-28 ENCOUNTER — Other Ambulatory Visit: Payer: Self-pay | Admitting: Cardiology

## 2022-05-26 ENCOUNTER — Other Ambulatory Visit: Payer: Self-pay | Admitting: Internal Medicine

## 2022-05-26 DIAGNOSIS — D6859 Other primary thrombophilia: Secondary | ICD-10-CM

## 2022-06-26 ENCOUNTER — Other Ambulatory Visit: Payer: Self-pay | Admitting: Internal Medicine

## 2022-06-26 DIAGNOSIS — I2510 Atherosclerotic heart disease of native coronary artery without angina pectoris: Secondary | ICD-10-CM

## 2022-06-26 DIAGNOSIS — E785 Hyperlipidemia, unspecified: Secondary | ICD-10-CM

## 2022-07-22 DIAGNOSIS — J019 Acute sinusitis, unspecified: Secondary | ICD-10-CM | POA: Diagnosis not present

## 2022-07-22 DIAGNOSIS — S8991XA Unspecified injury of right lower leg, initial encounter: Secondary | ICD-10-CM | POA: Diagnosis not present

## 2022-07-25 ENCOUNTER — Other Ambulatory Visit: Payer: Self-pay | Admitting: Internal Medicine

## 2022-07-25 DIAGNOSIS — E785 Hyperlipidemia, unspecified: Secondary | ICD-10-CM

## 2022-07-25 DIAGNOSIS — I2510 Atherosclerotic heart disease of native coronary artery without angina pectoris: Secondary | ICD-10-CM

## 2022-08-08 DIAGNOSIS — M47816 Spondylosis without myelopathy or radiculopathy, lumbar region: Secondary | ICD-10-CM | POA: Diagnosis not present

## 2022-08-08 DIAGNOSIS — G500 Trigeminal neuralgia: Secondary | ICD-10-CM | POA: Diagnosis not present

## 2022-08-08 DIAGNOSIS — G43809 Other migraine, not intractable, without status migrainosus: Secondary | ICD-10-CM | POA: Diagnosis not present

## 2022-08-08 DIAGNOSIS — G894 Chronic pain syndrome: Secondary | ICD-10-CM | POA: Diagnosis not present

## 2022-11-11 ENCOUNTER — Other Ambulatory Visit: Payer: Self-pay | Admitting: Internal Medicine

## 2022-11-11 DIAGNOSIS — I714 Abdominal aortic aneurysm, without rupture, unspecified: Secondary | ICD-10-CM

## 2022-11-25 ENCOUNTER — Other Ambulatory Visit: Payer: Self-pay | Admitting: Internal Medicine

## 2022-11-25 ENCOUNTER — Ambulatory Visit (HOSPITAL_COMMUNITY)
Admission: RE | Admit: 2022-11-25 | Discharge: 2022-11-25 | Disposition: A | Discharge status: Home / Self Care | Payer: Medicare Other | Source: Ambulatory Visit | Attending: Internal Medicine | Admitting: Internal Medicine

## 2022-11-25 DIAGNOSIS — I714 Abdominal aortic aneurysm, without rupture, unspecified: Secondary | ICD-10-CM | POA: Insufficient documentation

## 2022-11-25 DIAGNOSIS — D6859 Other primary thrombophilia: Secondary | ICD-10-CM

## 2022-11-29 ENCOUNTER — Other Ambulatory Visit: Payer: Self-pay | Admitting: Cardiology

## 2022-11-29 ENCOUNTER — Other Ambulatory Visit: Payer: Self-pay | Admitting: Internal Medicine

## 2022-11-29 DIAGNOSIS — D6859 Other primary thrombophilia: Secondary | ICD-10-CM

## 2022-12-02 ENCOUNTER — Other Ambulatory Visit: Payer: Self-pay | Admitting: Internal Medicine

## 2022-12-02 DIAGNOSIS — D508 Other iron deficiency anemias: Secondary | ICD-10-CM

## 2022-12-05 ENCOUNTER — Encounter: Payer: Self-pay | Admitting: Internal Medicine

## 2022-12-05 ENCOUNTER — Ambulatory Visit (INDEPENDENT_AMBULATORY_CARE_PROVIDER_SITE_OTHER): Payer: Medicare Other | Admitting: Internal Medicine

## 2022-12-05 VITALS — BP 124/78 | HR 71 | Temp 98.3°F | Resp 16 | Ht 74.0 in | Wt 251.0 lb

## 2022-12-05 DIAGNOSIS — N400 Enlarged prostate without lower urinary tract symptoms: Secondary | ICD-10-CM

## 2022-12-05 DIAGNOSIS — I10 Essential (primary) hypertension: Secondary | ICD-10-CM | POA: Diagnosis not present

## 2022-12-05 DIAGNOSIS — D508 Other iron deficiency anemias: Secondary | ICD-10-CM | POA: Diagnosis not present

## 2022-12-05 DIAGNOSIS — Z0001 Encounter for general adult medical examination with abnormal findings: Secondary | ICD-10-CM

## 2022-12-05 DIAGNOSIS — R195 Other fecal abnormalities: Secondary | ICD-10-CM | POA: Diagnosis not present

## 2022-12-05 DIAGNOSIS — I2510 Atherosclerotic heart disease of native coronary artery without angina pectoris: Secondary | ICD-10-CM

## 2022-12-05 DIAGNOSIS — E785 Hyperlipidemia, unspecified: Secondary | ICD-10-CM

## 2022-12-05 DIAGNOSIS — D6859 Other primary thrombophilia: Secondary | ICD-10-CM

## 2022-12-05 NOTE — Patient Instructions (Signed)
Health Maintenance, Male Adopting a healthy lifestyle and getting preventive care are important in promoting health and wellness. Ask your health care provider about: The right schedule for you to have regular tests and exams. Things you can do on your own to prevent diseases and keep yourself healthy. What should I know about diet, weight, and exercise? Eat a healthy diet  Eat a diet that includes plenty of vegetables, fruits, low-fat dairy products, and lean protein. Do not eat a lot of foods that are high in solid fats, added sugars, or sodium. Maintain a healthy weight Body mass index (BMI) is a measurement that can be used to identify possible weight problems. It estimates body fat based on height and weight. Your health care provider can help determine your BMI and help you achieve or maintain a healthy weight. Get regular exercise Get regular exercise. This is one of the most important things you can do for your health. Most adults should: Exercise for at least 150 minutes each week. The exercise should increase your heart rate and make you sweat (moderate-intensity exercise). Do strengthening exercises at least twice a week. This is in addition to the moderate-intensity exercise. Spend less time sitting. Even light physical activity can be beneficial. Watch cholesterol and blood lipids Have your blood tested for lipids and cholesterol at 71 years of age, then have this test every 5 years. You may need to have your cholesterol levels checked more often if: Your lipid or cholesterol levels are high. You are older than 71 years of age. You are at high risk for heart disease. What should I know about cancer screening? Many types of cancers can be detected early and may often be prevented. Depending on your health history and family history, you may need to have cancer screening at various ages. This may include screening for: Colorectal cancer. Prostate cancer. Skin cancer. Lung  cancer. What should I know about heart disease, diabetes, and high blood pressure? Blood pressure and heart disease High blood pressure causes heart disease and increases the risk of stroke. This is more likely to develop in people who have high blood pressure readings or are overweight. Talk with your health care provider about your target blood pressure readings. Have your blood pressure checked: Every 3-5 years if you are 18-39 years of age. Every year if you are 40 years old or older. If you are between the ages of 65 and 75 and are a current or former smoker, ask your health care provider if you should have a one-time screening for abdominal aortic aneurysm (AAA). Diabetes Have regular diabetes screenings. This checks your fasting blood sugar level. Have the screening done: Once every three years after age 45 if you are at a normal weight and have a low risk for diabetes. More often and at a younger age if you are overweight or have a high risk for diabetes. What should I know about preventing infection? Hepatitis B If you have a higher risk for hepatitis B, you should be screened for this virus. Talk with your health care provider to find out if you are at risk for hepatitis B infection. Hepatitis C Blood testing is recommended for: Everyone born from 1945 through 1965. Anyone with known risk factors for hepatitis C. Sexually transmitted infections (STIs) You should be screened each year for STIs, including gonorrhea and chlamydia, if: You are sexually active and are younger than 71 years of age. You are older than 71 years of age and your   health care provider tells you that you are at risk for this type of infection. Your sexual activity has changed since you were last screened, and you are at increased risk for chlamydia or gonorrhea. Ask your health care provider if you are at risk. Ask your health care provider about whether you are at high risk for HIV. Your health care provider  may recommend a prescription medicine to help prevent HIV infection. If you choose to take medicine to prevent HIV, you should first get tested for HIV. You should then be tested every 3 months for as long as you are taking the medicine. Follow these instructions at home: Alcohol use Do not drink alcohol if your health care provider tells you not to drink. If you drink alcohol: Limit how much you have to 0-2 drinks a day. Know how much alcohol is in your drink. In the [place] one drink equals one 12 oz bottle of beer (355 mL), one 5 oz glass of wine (148 mL), or one 1 oz glass of hard liquor (44 mL). Lifestyle Do not use any products that contain nicotine or tobacco. These products include cigarettes, chewing tobacco, and vaping devices, such as e-cigarettes. If you need help quitting, ask your health care provider. Do not use street drugs. Do not share needles. Ask your health care provider for help if you need support or information about quitting drugs. General instructions Schedule regular health, dental, and eye exams. Stay current with your vaccines. Tell your health care provider if: You often feel depressed. You have ever been abused or do not feel safe at home. Summary Adopting a healthy lifestyle and getting preventive care are important in promoting health and wellness. Follow your health care provider's instructions about healthy diet, exercising, and getting tested or screened for diseases. Follow your health care provider's instructions on monitoring your cholesterol and blood pressure. This information is not intended to replace advice given to you by your health care provider. Make sure you discuss any questions you have with your health care provider. Document Revised: 10/30/2020 Document Reviewed: 10/30/2020 Elsevier Patient Education  2024 [employer]  

## 2022-12-05 NOTE — Progress Notes (Unsigned)
Subjective:  Patient ID: Willie Black, male    DOB: 1950/08/21  Age: 72 y.o. MRN: [id number]  CC: Annual Exam   HPI Willie Black presents for a CPX and f/up ---  He is active and denies DOE, CP, SOB, edema, palpitations.  Outpatient Medications Prior to Visit  Medication Sig Dispense Refill   Cholecalciferol (VITAMIN D3 PO) Take 1 tablet by mouth daily.     Cyanocobalamin (VITAMIN B-12 PO) Take 1 capsule by mouth daily.     esomeprazole (NEXIUM) 20 MG capsule Take 20 mg by mouth daily at 12 noon.     ferrous sulfate 325 (65 FE) MG tablet TAKE 1 TABLET BY MOUTH TWICE A DAY WITH MEALS 180 tablet 0   metoprolol succinate (TOPROL XL) 25 MG 24 hr tablet Take 1 tablet (25 mg total) by mouth daily. 90 tablet 3   OXcarbazepine (TRILEPTAL) 150 MG tablet Take 75 mg by mouth daily.     triamcinolone (KENALOG) 0.025 % ointment Apply 1 Application topically 2 (two) times daily. 30 g 0   Ascorbic Acid (VITAMIN C) 100 MG tablet Take 1 tablet (100 mg total) by mouth in the morning and at bedtime. 180 tablet 1   Potassium 99 MG TABS Take 1 tablet by mouth daily.     rosuvastatin (CRESTOR) 10 MG tablet TAKE 1 TABLET BY MOUTH EVERY DAY 90 tablet 0   XARELTO 10 MG TABS tablet TAKE 1 TABLET BY MOUTH DAILY. ANNUAL APPT DUE IN JUNE MUST SEE PROVIDER FOR FUTURE REFILLS 30 tablet 5   No facility-administered medications prior to visit.    ROS Review of Systems  Constitutional: Negative.  Negative for chills, diaphoresis, fatigue and fever.  HENT: Negative.    Eyes: Negative.  Negative for visual disturbance.  Respiratory:  Negative for cough, chest tightness, shortness of breath and wheezing.   Cardiovascular:  Negative for chest pain, palpitations and leg swelling.  Gastrointestinal:  Positive for anal bleeding. Negative for abdominal pain, blood in stool, diarrhea, nausea and vomiting.  Endocrine: Negative.   Genitourinary: Negative.  Negative for difficulty urinating.   Musculoskeletal: Negative.  Negative for arthralgias, joint swelling and myalgias.  Skin: Negative.  Negative for color change and pallor.  Neurological:  Negative for dizziness, weakness and light-headedness.  Hematological:  Negative for adenopathy. Does not bruise/bleed easily.  Psychiatric/Behavioral: Negative.      Objective:  BP 124/78 (BP Location: Right Arm, Patient Position: Sitting, Cuff Size: Large)   Pulse 71   Temp 98.3 F (36.8 C) (Oral)   Resp 16   Ht 6\' 2"  (1.88 m)   Wt 251 lb (113.9 kg)   SpO2 94%   BMI 32.23 kg/m   BP Readings from Last 3 Encounters:  12/05/22 124/78  03/26/22 (!) 154/80  02/28/22 112/60    Wt Readings from Last 3 Encounters:  12/05/22 251 lb (113.9 kg)  03/26/22 244 lb (110.7 kg)  02/28/22 242 lb 12.8 oz (110.1 kg)    Physical Exam Vitals reviewed.  Constitutional:      Appearance: Normal appearance.  HENT:     Mouth/Throat:     Mouth: Mucous membranes are moist.  Eyes:     General: No scleral icterus.    Conjunctiva/sclera: Conjunctivae normal.  Cardiovascular:     Rate and Rhythm: Normal rate and regular rhythm.     Heart sounds: No murmur heard.    No gallop.  Pulmonary:     Effort: Pulmonary effort is normal.  Breath sounds: No stridor. No wheezing, rhonchi or rales.  Abdominal:     General: Abdomen is flat.     Palpations: There is no mass.     Tenderness: There is no abdominal tenderness. There is no guarding or rebound.     Hernia: No hernia is present.  Genitourinary:    Penis: Normal and circumcised.      Prostate: Enlarged. Not tender and no nodules present.     Rectum: Guaiac result positive. Internal hemorrhoid present. No mass, tenderness, anal fissure or external hemorrhoid. Normal anal tone.  Musculoskeletal:        General: Normal range of motion.     Cervical back: Neck supple.     Right lower leg: No edema.     Left lower leg: No edema.  Lymphadenopathy:     Cervical: No cervical adenopathy.   Skin:    General: Skin is warm and dry.     Coloration: Skin is not jaundiced or pale.     Findings: No bruising.  Neurological:     General: No focal deficit present.     Mental Status: He is alert. Mental status is at baseline.  Psychiatric:        Mood and Affect: Mood normal.        Behavior: Behavior normal.        Thought Content: Thought content normal.     Lab Results  Component Value Date   WBC 5.3 12/05/2022   HGB 13.6 12/05/2022   HCT 42.0 12/05/2022   PLT 211.0 12/05/2022   GLUCOSE 73 12/05/2022   CHOL 139 12/05/2022   TRIG 128.0 12/05/2022   HDL 44.30 12/05/2022   LDLDIRECT 74 02/28/2022   LDLCALC 69 12/05/2022   ALT 11 12/05/2022   AST 15 12/05/2022   NA 141 12/05/2022   K 4.2 12/05/2022   CL 102 12/05/2022   CREATININE 1.30 12/05/2022   BUN 19 12/05/2022   CO2 30 12/05/2022   TSH 1.46 12/05/2022   PSA 0.86 12/05/2022   INR 0.97 11/22/2010    VAS [place] AAA DUPLEX  Result Date: 11/25/2022 ABDOMINAL AORTA STUDY Patient Name:  Willie Black  Date of Exam:   11/25/2022 Medical Rec #: [id number]             Accession #:    [id number] Date of Birth: 1950/08/21             Patient Gender: M Patient Age:   13 years Exam Location:  [facility] Procedure:      VAS [place] AAA DUPLEX Referring Phys: Sanda Linger --------------------------------------------------------------------------------  Indications: Follow up exam for known AAA. Risk Factors: Hypertension, hyperlipidemia, past history of smoking, coronary               artery disease. Other Factors: Hx CABG. Limitations: Air/bowel gas.  Comparison Study: 11/15/21 AAA duplex Performing Technologist: Lowell Guitar RVT, RDMS  Examination Guidelines: A complete evaluation includes B-mode imaging, spectral Doppler, color Doppler, and power Doppler as needed of all accessible portions of each vessel. Bilateral testing is considered an integral part of a complete examination. Limited examinations for reoccurring  indications may be performed as noted.  Abdominal Aorta Findings: +-----------+-------+----------+----------+---------+--------+--------+ Location   AP (cm)Trans (cm)PSV (cm/s)Waveform ThrombusComments +-----------+-------+----------+----------+---------+--------+--------+ Proximal                    78        triphasic                 +-----------+-------+----------+----------+---------+--------+--------+  Mid        2.40   2.16      98        triphasic                 +-----------+-------+----------+----------+---------+--------+--------+ Distal     2.67   2.78      81        triphasic                 +-----------+-------+----------+----------+---------+--------+--------+ RT CIA Prox1.0    0.9       94        triphasic                 +-----------+-------+----------+----------+---------+--------+--------+ LT CIA Prox1.2    1.0       97        biphasic                  +-----------+-------+----------+----------+---------+--------+--------+ Visualization of the Proximal Abdominal Aorta was limited.  Summary: Abdominal Aorta: There is evidence of abnormal dilatation of the distal Abdominal aorta. The largest aortic measurement is 2.8 cm. The largest aortic diameter has decreased compared to prior exam. Previous diameter measurement was 3.5 cm obtained on 11/15/21.  *See table(s) above for measurements and observations.  Electronically signed by Lemar Livings MD on 11/25/2022 at 4:42:49 PM.    Final     Assessment & Plan:   Coronary artery disease involving native coronary artery of native heart without angina pectoris- He has had no recent angina.  -     Lipid panel; Future -     Rosuvastatin Calcium; Take 1 tablet (10 mg total) by mouth daily.  Dispense: 90 tablet; Refill: 1  Other primary thrombophilia (HCC) -     Rivaroxaban; Take 1 tablet (10 mg total) by mouth daily.  Dispense: 90 tablet; Refill: 1  Benign prostatic hyperplasia without lower urinary tract  symptoms -     PSA; Future -     Urinalysis, Routine w reflex microscopic; Future  Hypercoagulable state (HCC) -     Rivaroxaban; Take 1 tablet (10 mg total) by mouth daily.  Dispense: 90 tablet; Refill: 1  Hypertension, unspecified type- His blood pressure is well-controlled. -     TSH; Future  Hyperlipidemia LDL goal <70 - LDL goal achieved. Doing well on the statin  -     Lipid panel; Future -     TSH; Future -     Hepatic function panel; Future -     Rosuvastatin Calcium; Take 1 tablet (10 mg total) by mouth daily.  Dispense: 90 tablet; Refill: 1  Iron deficiency anemia due to dietary causes -     IBC + Ferritin; Future -     CBC with Differential/Platelet; Future  Encounter for general adult medical examination with abnormal findings- Exam completed, labs reviewed, vaccines reviewed and updated, cancer screenings addressed, patient education was given.  Essential hypertension -     Basic metabolic panel; Future -     Urinalysis, Routine w reflex microscopic; Future  Heme positive stool -     Ambulatory referral to Gastroenterology     Follow-up: Return in about 6 months (around 06/06/2023).  Sanda Linger, MD

## 2022-12-06 LAB — LIPID PANEL
Cholesterol: 139 mg/dL (ref 0–200)
HDL: 44.3 mg/dL (ref >39.00)
LDL Cholesterol: 69 mg/dL (ref 0–99)
NonHDL: 94.67
Total CHOL/HDL Ratio: 3
Triglycerides: 128 mg/dL (ref 0.0–149.0)
VLDL: 25.6 mg/dL (ref 0.0–40.0)

## 2022-12-06 LAB — BASIC METABOLIC PANEL
BUN: 19 mg/dL (ref 6–23)
CO2: 30 mEq/L (ref 19–32)
Calcium: 9.1 mg/dL (ref 8.4–10.5)
Chloride: 102 mEq/L (ref 96–112)
Creatinine, Ser: 1.3 mg/dL (ref 0.40–1.50)
GFR: 55.51 mL/min — ABNORMAL LOW (ref >60.00)
Glucose, Bld: 73 mg/dL (ref 70–99)
Potassium: 4.2 mEq/L (ref 3.5–5.1)
Sodium: 141 mEq/L (ref 135–145)

## 2022-12-06 LAB — CBC WITH DIFFERENTIAL/PLATELET
Basophils Absolute: 0 10*3/uL (ref 0.0–0.1)
Basophils Relative: 0.8 % (ref 0.0–3.0)
Eosinophils Absolute: 0.1 10*3/uL (ref 0.0–0.7)
Eosinophils Relative: 2.7 % (ref 0.0–5.0)
HCT: 42 % (ref 39.0–52.0)
Hemoglobin: 13.6 g/dL (ref 13.0–17.0)
Lymphocytes Relative: 24.3 % (ref 12.0–46.0)
Lymphs Abs: 1.3 10*3/uL (ref 0.7–4.0)
MCHC: 32.3 g/dL (ref 30.0–36.0)
MCV: 94.8 fl (ref 78.0–100.0)
Monocytes Absolute: 0.7 10*3/uL (ref 0.1–1.0)
Monocytes Relative: 14 % — ABNORMAL HIGH (ref 3.0–12.0)
Neutro Abs: 3.1 10*3/uL (ref 1.4–7.7)
Neutrophils Relative %: 58.2 % (ref 43.0–77.0)
Platelets: 211 10*3/uL (ref 150.0–400.0)
RBC: 4.43 Mil/uL (ref 4.22–5.81)
RDW: 14 % (ref 11.5–15.5)
WBC: 5.3 10*3/uL (ref 4.0–10.5)

## 2022-12-06 LAB — URINALYSIS, ROUTINE W REFLEX MICROSCOPIC
Bilirubin Urine: NEGATIVE
Hgb urine dipstick: NEGATIVE
Leukocytes,Ua: NEGATIVE
Nitrite: NEGATIVE
RBC / HPF: NONE SEEN (ref 0–?)
Specific Gravity, Urine: >=1.03 — AB (ref 1.000–1.030)
Total Protein, Urine: NEGATIVE
Urine Glucose: NEGATIVE
Urobilinogen, UA: 0.2 (ref 0.0–1.0)
pH: 5.5 (ref 5.0–8.0)

## 2022-12-06 LAB — TSH: TSH: 1.46 u[IU]/mL (ref 0.35–5.50)

## 2022-12-06 LAB — IBC + FERRITIN
Ferritin: 88.5 ng/mL (ref 22.0–322.0)
Iron: 53 ug/dL (ref 42–165)
Saturation Ratios: 19.5 % — ABNORMAL LOW (ref 20.0–50.0)
TIBC: 271.6 ug/dL (ref 250.0–450.0)
Transferrin: 194 mg/dL — ABNORMAL LOW (ref 212.0–360.0)

## 2022-12-06 LAB — HEPATIC FUNCTION PANEL
ALT: 11 U/L (ref 0–53)
AST: 15 U/L (ref 0–37)
Albumin: 4 g/dL (ref 3.5–5.2)
Alkaline Phosphatase: 95 U/L (ref 39–117)
Bilirubin, Direct: 0.1 mg/dL (ref 0.0–0.3)
Total Bilirubin: 0.3 mg/dL (ref 0.2–1.2)
Total Protein: 7.1 g/dL (ref 6.0–8.3)

## 2022-12-06 LAB — PSA: PSA: 0.86 ng/mL (ref 0.10–4.00)

## 2022-12-06 MED ORDER — ROSUVASTATIN CALCIUM 10 MG PO TABS: 10.0000 mg | ORAL_TABLET | Freq: Every day | ORAL | 1 refills | Status: DC

## 2022-12-06 MED ORDER — RIVAROXABAN 10 MG PO TABS: 10.0000 mg | ORAL_TABLET | Freq: Every day | ORAL | 1 refills | Status: DC

## 2022-12-12 ENCOUNTER — Encounter: Payer: Self-pay | Admitting: Gastroenterology

## 2023-01-16 ENCOUNTER — Ambulatory Visit: Payer: Medicare Other | Admitting: Gastroenterology

## 2023-01-16 ENCOUNTER — Telehealth: Payer: Self-pay | Admitting: *Deleted

## 2023-01-16 ENCOUNTER — Telehealth: Payer: Self-pay

## 2023-01-16 ENCOUNTER — Encounter: Payer: Self-pay | Admitting: Gastroenterology

## 2023-01-16 VITALS — BP 102/62 | HR 62 | Ht 74.0 in | Wt 247.5 lb

## 2023-01-16 DIAGNOSIS — Z7902 Long term (current) use of antithrombotics/antiplatelets: Secondary | ICD-10-CM

## 2023-01-16 DIAGNOSIS — Z01818 Encounter for other preprocedural examination: Secondary | ICD-10-CM

## 2023-01-16 DIAGNOSIS — K625 Hemorrhage of anus and rectum: Secondary | ICD-10-CM | POA: Diagnosis not present

## 2023-01-16 MED ORDER — NA SULFATE-K SULFATE-MG SULF 17.5-3.13-1.6 GM/177ML PO SOLN: 1.0000 | Freq: Once | ORAL | 0 refills | Status: AC

## 2023-01-16 NOTE — Telephone Encounter (Signed)
[facility] Pre-operative Risk Assessment     Request for surgical clearance:     Endoscopy Procedure  What type of surgery is being performed?    Colonoscopy  When is this surgery scheduled?      03-17-2023  What type of clearance is required ?   Pharmacy  Are there any medications that need to be held prior to surgery and how long? 2 day hold on Xarelto  Practice name and name of physician performing surgery?      [facility]  What is your office phone and fax number?      Phone- [phone]  Fax- [phone]  Anesthesia type (None, local, MAC, general) ?       MAC

## 2023-01-16 NOTE — Telephone Encounter (Signed)
Pharmacy please advise on holding Xarelto prior to colonoscopy scheduled for 03/17/2023. Thank you.

## 2023-01-16 NOTE — Progress Notes (Addendum)
[facility] Consult Note:  History: Willie Black 01/16/2023  Referring provider: Etta Grandchild, MD  Reason for consult/chief complaint: Colonoscopy (Discuss colon, Had colon at [facility] with Willie Black about 10 years ago)   Subjective  HPI: Willie Black was referred to [place] by his primary care provider Willie Black after a visit at that clinic in early June when he reported anorectal bleeding.  Willie Black did a rectal exam and reports heme positive stool.  Recalls having a colonoscopy in 2019 with [facility].  Recalls polys on 1st exam, none on next two exams. He had a negative FOBT in April 2015.  He reports episodic painless rectal bleeding that he suspect may be hemorrhoidal in nature.  No anal or rectal pain, no abdominal pain, bowel habits regular.  Uses as needed Preparation H suppository when bleeding occurs.  He had a brief episode of this when he saw primary care as noted above.  No chronic upper digestive symptoms, has gained some weight in the last year or so, and there is been stressed caring for his wife with ovarian cancer.  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  He can do significant physical exertion without getting chest pain or dyspnea.  Believes his last colonoscopy was in 2019 at [facility] as noted above, thinks maybe he was due at 72 but does not think there were polyps on the last exam.  Lastly, Willie Black describes intermittent rectal prolapse occurring for years that he relates to his constipation.  He has periodically had to manually reduce that prolapse, and he mainly wanted to know in case he had any sequela of that on colonoscopy.   ROS:  Review of Systems  Constitutional:  Negative for appetite change and unexpected weight change.  HENT:  Negative for mouth sores and voice change.   Eyes:  Negative for pain and redness.  Respiratory:  Negative for cough and shortness of breath.   Cardiovascular:   Negative for chest pain and palpitations.  Genitourinary:  Negative for dysuria and hematuria.  Musculoskeletal:  Negative for arthralgias and myalgias.  Skin:  Negative for pallor and rash.  Neurological:  Negative for weakness and headaches.  Hematological:  Negative for adenopathy.     Past Medical History: Past Medical History:  Diagnosis Date   Anxiety    CAD (coronary artery disease)    a. s/p CABG in 2011 with LIMA-LAD, SVG-OM, and SVG-PDA b. low-risk NST in 08/2015   GERD (gastroesophageal reflux disease)    Heart attack (HCC) 2011   Hypercholesteremia    Pneumothorax 03/18/2019   LEFT SIDE   PVC (premature ventricular contraction)    Bigeminy PVC's on event monitor Nov 2012   Renal calculi    Shingles    Trigeminal neuralgia of right side of face    From last cardiology office note of September 2023:  "Willie Black is a 72 y.o. male with a hx of CAD s/p CABG in 2011 with LIMA-LAD, SVG-OM, and SVG-PDA, hypertension, hyperlipidemia, and PVCs.  He has been intolerant to multiple statins in the past.  Nuclear stress test in 2017 was low risk and his ejection fraction was normal.  He developed acute L LE DVT in 2019 and was started on Xarelto by his PCP.  His cholesterol is followed by his PCP.  He has a history of trigeminal neuralgia and underwent gamma knife surgery at [facility] 08/2018 with some improvement.  He had a spontaneous left pneumothorax 03/18/2019 and underwent left VATS 03/24/2019.  He has right subclavian stenosis but is asymptomatic.  He has a AAA of 3.1 cm (09/2019).   He was last seen by Dr. Swaziland 10/2019 and was dong well at that time. He is on 10 mg crestor and is not able to tolerate higher doses.    He presents for 2 year follow up.  He is on 10 mg xarelto and has been since is VATS, per CT surgery.    He can walk 2 miles on the treadmill three times per week. 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 He also plays golf. He has  no cardiac complaints.  He will need an upcoming colonoscopy. "  Past Surgical History: Past Surgical History:  Procedure Laterality Date   CERVICAL DISC SURGERY     CORONARY ARTERY BYPASS GRAFT  05/28/2110   x3 with LIMA to LAD, SVG to OM and SVG to LAD after having ST elevation with coming off pump   KNEE ARTHROSCOPY     NECK SURGERY     STAPLING OF BLEBS Left 03/24/2019   Procedure: STAPLING OF BLEBS;  Surgeon: Alleen Borne, MD;  Location: [facility] OR;  Service: Thoracic;  Laterality: Left;   VIDEO ASSISTED THORACOSCOPY Left 03/24/2019   Procedure: VIDEO ASSISTED THORACOSCOPY;  Surgeon: Alleen Borne, MD;  Location: [facility] OR;  Service: Thoracic;  Laterality: Left;     Family History: Family History  Problem Relation Age of Onset   Heart failure Mother    Aneurysm Father        abdominal   Coronary artery disease Father        3 stents   Colon cancer Neg Hx    Rectal cancer Neg Hx    Stomach cancer Neg Hx    Esophageal cancer Neg Hx    Pancreatic cancer Neg Hx    Liver cancer Neg Hx    No colorectal cancer  Social History: Social History   Socioeconomic History   Marital status: Married    Spouse name: Not on file   Number of children: 2   Years of education: 12   Highest education level: Not on file  Occupational History   Occupation: Investment banker, corporate: [employer]  Tobacco Use   Smoking status: Former    Current packs/day: 0.00    Types: Cigarettes    Quit date: 04/27/2009    Years since quitting: 13.7   Smokeless tobacco: Never  Vaping Use   Vaping status: Never Used  Substance and Sexual Activity   Alcohol use: No   Drug use: No   Sexual activity: Yes  Other Topics Concern   Not on file  Social History Narrative   Lives at home with his wife.   Right-handed.   2 cups caffeine daily.   Social Determinants of Health   Financial Resource Strain: Low Risk  (12/07/2020)   Overall Financial Resource Strain (CARDIA)    Difficulty of Paying Living  Expenses: Not very hard  Food Insecurity: No Food Insecurity (02/06/2022)   Hunger Vital Sign    Worried About Running Out of Food in the Last Year: Never true    Ran Out of Food in the Last Year: Never true  Transportation Needs: No Transportation Needs (02/06/2022)   PRAPARE - Administrator, Civil Service (Medical): No    Lack of Transportation (Non-Medical): No  Physical  Activity: Not on file  Stress: Not on file  Social Connections: Not on file    Allergies: Allergies  Allergen Reactions   Penicillins Rash    Did it involve swelling of the face/tongue/throat, SOB, or low BP? Unknown Did it involve sudden or severe rash/hives, skin peeling, or any reaction on the inside of your mouth or nose? Unknown Did you need to seek medical attention at a hospital or doctor's office? Unknown When did it last happen?      childhood If all above answers are "NO", may proceed with cephalosporin use.     Outpatient Meds: Current Outpatient Medications  Medication Sig Dispense Refill   Cholecalciferol (VITAMIN D3 PO) Take 1 tablet by mouth daily.     Cyanocobalamin (VITAMIN B-12 PO) Take 1 capsule by mouth daily.     esomeprazole (NEXIUM) 20 MG capsule Take 20 mg by mouth daily at 12 noon.     ferrous sulfate 325 (65 FE) MG tablet TAKE 1 TABLET BY MOUTH TWICE A DAY WITH MEALS 180 tablet 0   metoprolol succinate (TOPROL XL) 25 MG 24 hr tablet Take 1 tablet (25 mg total) by mouth daily. 90 tablet 3   OXcarbazepine (TRILEPTAL) 150 MG tablet Take 75 mg by mouth daily.     Potassium Gluconate 550 MG TABS Take 1 tablet by mouth daily.     rivaroxaban (XARELTO) 10 MG TABS tablet Take 1 tablet (10 mg total) by mouth daily. 90 tablet 1   rosuvastatin (CRESTOR) 10 MG tablet Take 1 tablet (10 mg total) by mouth daily. 90 tablet 1   No current facility-administered medications for this visit.      ___________________________________________________________________ Objective    Exam:  BP 102/62   Pulse 62   Ht 6\' 2"  (1.88 m)   Wt 247 lb 8 oz (112.3 kg)   SpO2 96%   BMI 31.78 kg/m  Wt Readings from Last 3 Encounters:  01/16/23 247 lb 8 oz (112.3 kg)  12/05/22 251 lb (113.9 kg)  03/26/22 244 lb (110.7 kg)    General: Well-appearing Eyes: sclera anicteric, no redness ENT: oral mucosa moist without lesions, no cervical or supraclavicular lymphadenopathy CV: Regular without appreciable murmur, no JVD, no peripheral edema Resp: clear to auscultation bilaterally, normal RR and effort noted GI: soft, no tenderness, with active bowel sounds. No guarding or palpable organomegaly noted. Skin; warm and dry, no rash or jaundice noted Neuro: awake, alert and oriented x 3. Normal gross motor function and fluent speech  Labs:     Latest Ref Rng & Units 12/05/2022    4:28 PM 12/03/2021    3:58 PM 05/02/2021    9:12 AM  CBC  WBC 4.0 - 10.5 K/uL 5.3  4.7  4.9   Hemoglobin 13.0 - 17.0 g/dL 28.4  13.2  44.0   Hematocrit 39.0 - 52.0 % 42.0  39.7  38.6   Platelets 150.0 - 400.0 K/uL 211.0  210.0  208.0       Latest Ref Rng & Units 12/05/2022    4:28 PM 02/28/2022    9:47 AM 05/02/2021    9:12 AM  CMP  Glucose 70 - 99 mg/dL 73   75   BUN 6 - 23 mg/dL 19   18   Creatinine 1.02 - 1.50 mg/dL 7.25   3.66   Sodium 440 - 145 mEq/L 141   138   Potassium 3.5 - 5.1 mEq/L 4.2   4.6   Chloride 96 - 112 mEq/L  102   102   CO2 19 - 32 mEq/L 30   31   Calcium 8.4 - 10.5 mg/dL 9.1   8.7   Total Protein 6.0 - 8.3 g/dL 7.1  6.9  6.7   Total Bilirubin 0.2 - 1.2 mg/dL 0.3  <1.6  0.4   Alkaline Phos 39 - 117 U/L 95  110  93   AST 0 - 37 U/L 15  18  22    ALT 0 - 53 U/L 11  10  20       Assessment: Encounter Diagnoses  Name Primary?   Rectal bleeding Yes   Long term (current) use of antithrombotics/antiplatelets   CAD with prior CABG.  Cardiac condition stable with no angina or further testing at last visit 10 months ago. History of DVT that may have been postop, on long-term  OAC.  He needs a diagnostic colonoscopy for the bleeding, not clear if he is due for surveillance or screening perspective without prior records.  We will request those records from [facility].  Recommended scheduling colonoscopy and he was agreeable after discussion of procedure and risks.  The benefits and risks of the planned procedure were described in detail with the patient or (when appropriate) their health care proxy.  Risks were outlined as including, but not limited to, bleeding, infection, perforation, adverse medication reaction leading to cardiac or pulmonary decompensation, pancreatitis (if ERCP).  The limitation of incomplete mucosal visualization was also discussed.  No guarantees or warranties were given. Patient at increased risk for cardiopulmonary complications of procedure due to medical comorbidities. (Long-term use anticoagulation, underlying CAD)  Hold Xarelto 2 days prior (36 to 48 hours depending on whether or not he takes it in the morning or evening).  Will request clearance from cardiology.  I will also forward my note to his cardiologist because I believe he is near due for his annual visit but does not have one on his epic appointment calendar yet.  I believe his condition qualifies him to have his colonoscopy in our outpatient endoscopy lab at the office, and I will send his chart to anesthesia provider per usual for chart review well in advance of his procedure date.  Farrel understands a small overall risk of blood clot, heart attack and stroke during his brief hold of oral anticoagulation.  Thank you for the courtesy of this consult.  Please call me with any questions or concerns.  Willie Black  CC: Referring provider noted above ______________________   Record review addendum 02/06/2023  Records from [facility] practice include report from a January 2009 colonoscopy by Dr. Dorena Cookey.  Indication was personal history of colon polyps (no details) Sigmoid  diverticuli, otherwise normal exam.  5-year recall recommended.  No additional procedure or other records included.  Willie Jupiter, MD

## 2023-01-16 NOTE — Telephone Encounter (Signed)
Dr. Loletha Carrow,  This pt is cleared for anesthetic care at [facility].   Thanks,  Osvaldo Angst

## 2023-01-16 NOTE — Patient Instructions (Signed)
_______________________________________________________  If your blood pressure at your visit was 140/90 or greater, please contact your primary care physician to follow up on this.  _______________________________________________________  If you are age 71 or older, your body mass index should be between 23-30. Your Body mass index is 31.78 kg/m. If this is out of the aforementioned range listed, please consider follow up with your Primary Care Provider.  If you are age 29 or younger, your body mass index should be between 19-25. Your Body mass index is 31.78 kg/m. If this is out of the aformentioned range listed, please consider follow up with your Primary Care Provider.   ________________________________________________________  The [facility] providers would like to encourage you to use [facility] to communicate with providers for non-urgent requests or questions.  Due to long hold times on the telephone, sending your provider a message by [facility] may be a faster and more efficient way to get a response.  Please allow 48 business hours for a response.  Please remember that this is for non-urgent requests.  _______________________________________________________  Bonita Quin have been scheduled for a colonoscopy. Please follow written instructions given to you at your visit today.   Please pick up your prep supplies at the pharmacy within the next 1-3 days.  If you use inhalers (even only as needed), please bring them with you on the day of your procedure.  DO NOT TAKE 7 DAYS PRIOR TO TEST- Trulicity (dulaglutide) Ozempic, Wegovy (semaglutide) Mounjaro (tirzepatide) Bydureon Bcise (exanatide extended release)  DO NOT TAKE 1 DAY PRIOR TO YOUR TEST Rybelsus (semaglutide) Adlyxin (lixisenatide) Victoza (liraglutide) Byetta (exanatide) ___________________________________________________________________________  Due to recent changes in healthcare laws, you may see the results of your imaging  and laboratory studies on MyChart before your provider has had a chance to review them.  We understand that in some cases there may be results that are confusing or concerning to you. Not all laboratory results come back in the same time frame and the provider may be waiting for multiple results in order to interpret others.  Please give [place] 48 hours in order for your provider to thoroughly review all the results before contacting the office for clarification of your results.   You will be contacted by our office prior to your procedure for directions on holding your XARELTO. If you do not hear from our office 1 week prior to your scheduled procedure, please call [phone] to discuss.   It was a pleasure to see you today!  Thank you for trusting me with your gastrointestinal care!

## 2023-01-17 ENCOUNTER — Telehealth: Payer: Self-pay | Admitting: *Deleted

## 2023-01-17 NOTE — Telephone Encounter (Signed)
Pt has been scheduled for tele preop appt 02/28/23 @ 9 am. Med rec and consent are done.

## 2023-01-17 NOTE — Telephone Encounter (Signed)
Pt has been scheduled for tele preop appt 02/28/23 @ 9 am. Med rec and consent are done.     Patient Consent for Virtual Visit        Willie Black has provided verbal consent on 01/17/2023 for a virtual visit (video or telephone).   CONSENT FOR VIRTUAL VISIT FOR:  Willie Black  By participating in this virtual visit I agree to the following:  I hereby voluntarily request, consent and authorize [facility] and its employed or contracted physicians, physician assistants, nurse practitioners or other licensed health care professionals (the Practitioner), to provide me with telemedicine health care services (the "Services") as deemed necessary by the treating Practitioner. I acknowledge and consent to receive the Services by the Practitioner via telemedicine. I understand that the telemedicine visit will involve communicating with the Practitioner through live audiovisual communication technology and the disclosure of certain medical information by electronic transmission. I acknowledge that I have been given the opportunity to request an in-person assessment or other available alternative prior to the telemedicine visit and am voluntarily participating in the telemedicine visit.  I understand that I have the right to withhold or withdraw my consent to the use of telemedicine in the course of my care at any time, without affecting my right to future care or treatment, and that the Practitioner or I may terminate the telemedicine visit at any time. I understand that I have the right to inspect all information obtained and/or recorded in the course of the telemedicine visit and may receive copies of available information for a reasonable fee.  I understand that some of the potential risks of receiving the Services via telemedicine include:  Delay or interruption in medical evaluation due to technological equipment failure or disruption; Information transmitted may not be  sufficient (e.g. poor resolution of images) to allow for appropriate medical decision making by the Practitioner; and/or  In rare instances, security protocols could fail, causing a breach of personal health information.  Furthermore, I acknowledge that it is my responsibility to provide information about my medical history, conditions and care that is complete and accurate to the best of my ability. I acknowledge that Practitioner's advice, recommendations, and/or decision may be based on factors not within their control, such as incomplete or inaccurate data provided by me or distortions of diagnostic images or specimens that may result from electronic transmissions. I understand that the practice of medicine is not an exact science and that Practitioner makes no warranties or guarantees regarding treatment outcomes. I acknowledge that a copy of this consent can be made available to me via my patient portal [facility] MyChart), or I can request a printed copy by calling the office of [facility].    I understand that my insurance will be billed for this visit.   I have read or had this consent read to me. I understand the contents of this consent, which adequately explains the benefits and risks of the Services being provided via telemedicine.  I have been provided ample opportunity to ask questions regarding this consent and the Services and have had my questions answered to my satisfaction. I give my informed consent for the services to be provided through the use of telemedicine in my medical care

## 2023-01-17 NOTE — Telephone Encounter (Signed)
   Name: Willie Black  DOB: [birth year]  MRN: [id number]  Primary Cardiologist: Peter Swaziland, MD   Preoperative team, please contact this patient and set up a phone call appointment for further preoperative risk assessment. Please obtain consent and complete medication review. Thank you for your help.  I confirm that guidance regarding antiplatelet and oral anticoagulation therapy has been completed and, if necessary, noted below.  Per Pharmacy: Per office protocol, patient can hold Xarelto for 2 days prior to procedure.     Joni Reining, NP 01/17/2023, 9:09 AM [facility]

## 2023-01-17 NOTE — Telephone Encounter (Signed)
Patient with diagnosis of DVT in 2019 on Xarelto for anticoagulation.    Procedure: colonoscopy Date of procedure: 03/17/23   CrCl 69 ml/min  Per office protocol, patient can hold Xarelto for 2 days prior to procedure.    **This guidance is not considered finalized until pre-operative APP has relayed final recommendations.**

## 2023-02-20 ENCOUNTER — Other Ambulatory Visit: Payer: Self-pay

## 2023-02-20 MED ORDER — METOPROLOL SUCCINATE ER 25 MG PO TB24: 25.0000 mg | ORAL_TABLET | Freq: Every day | ORAL | 0 refills | Status: DC

## 2023-02-25 DIAGNOSIS — M47816 Spondylosis without myelopathy or radiculopathy, lumbar region: Secondary | ICD-10-CM | POA: Diagnosis not present

## 2023-02-25 DIAGNOSIS — G43809 Other migraine, not intractable, without status migrainosus: Secondary | ICD-10-CM | POA: Diagnosis not present

## 2023-02-25 DIAGNOSIS — G894 Chronic pain syndrome: Secondary | ICD-10-CM | POA: Diagnosis not present

## 2023-02-25 DIAGNOSIS — G500 Trigeminal neuralgia: Secondary | ICD-10-CM | POA: Diagnosis not present

## 2023-02-26 NOTE — Progress Notes (Signed)
Virtual Visit via Telephone Note   Because of Willie Black's co-morbid illnesses, he is at least at moderate risk for complications without adequate follow up.  This format is felt to be most appropriate for this patient at this time.  The patient did not have access to video technology/had technical difficulties with video requiring transitioning to audio format only (telephone).  All issues noted in this document were discussed and addressed.  No physical exam could be performed with this format.  Please refer to the patient's chart for his consent to telehealth for [facility].  Evaluation Performed:  Preoperative cardiovascular risk assessment _____________   Date:  02/28/2023   Patient ID:  Willie Black, DOB 1951/06/17, MRN [id number] Patient Location:  Home Provider location:   Office  Primary Care Provider:  Etta Grandchild, MD Primary Cardiologist:  Peter Swaziland, MD  Chief Complaint / Patient Profile   72 y.o. y/o male with a h/o CAD s/p CABG in 2011 with LIMA-LAD, SVG-OM, and SVG-PDA, hypertension, hyperlipidemia, and PVCs. He has been intolerant to multiple statins in the past. Nuclear stress test in 2017 was low risk and his ejection fraction was normal. He developed acute L LE DVT in 2019 and was started on Xarelto by his PCP.    Who is pending colonoscopy with [facility] scheduled for 03/17/2023 and presents today for telephonic preoperative cardiovascular risk assessment.  History of Present Illness    Willie Black is a 72 y.o. male who presents via audio/video conferencing for a telehealth visit today.  Pt was last seen in cardiology clinic on 02/28/2022, by Micah Flesher PA.  At that time Willie Black was doing well.  The patient is now pending procedure as outlined above. 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  He denies any excessive bleeding, melena, or epistaxis on Xarelto.  Past Medical History    Past Medical History:  Diagnosis Date   Anxiety    CAD (coronary artery disease)    a. s/p CABG in 2011 with LIMA-LAD, SVG-OM, and SVG-PDA b. low-risk NST in 08/2015   GERD (gastroesophageal reflux disease)    Heart attack (HCC) 2011   Hypercholesteremia    Pneumothorax 03/18/2019   LEFT SIDE   PVC (premature ventricular contraction)    Bigeminy PVC's on event monitor Nov 2012   Renal calculi    Shingles    Trigeminal neuralgia of right side of face    Past Surgical History:  Procedure Laterality Date   CERVICAL DISC SURGERY     CORONARY ARTERY BYPASS GRAFT  05/28/2110   x3 with LIMA to LAD, SVG to OM and SVG to LAD after having ST elevation with coming off pump   KNEE ARTHROSCOPY     NECK SURGERY     STAPLING OF BLEBS Left 03/24/2019   Procedure: STAPLING OF BLEBS;  Surgeon: Alleen Borne, MD;  Location: [facility] OR;  Service: Thoracic;  Laterality: Left;   VIDEO ASSISTED THORACOSCOPY Left 03/24/2019   Procedure: VIDEO ASSISTED THORACOSCOPY;  Surgeon: Alleen Borne, MD;  Location: [facility] OR;  Service: Thoracic;  Laterality: Left;    Allergies  Allergies  Allergen Reactions   Penicillins Rash    Did it involve swelling of the face/tongue/throat, SOB, or low BP? Unknown Did it involve sudden or severe rash/hives, skin peeling, or any reaction  on the inside of your mouth or nose? Unknown Did you need to seek medical attention at a hospital or doctor's office? Unknown When did it last happen?      childhood If all above answers are "NO", may proceed with cephalosporin use.     Home Medications    Prior to Admission medications   Medication Sig Start Date End Date Taking? Authorizing Provider  Cholecalciferol (VITAMIN D3 PO) Take 1 tablet by mouth daily.    [provider]  Cyanocobalamin (VITAMIN B-12 PO) Take 1 capsule by mouth daily.     [provider]  esomeprazole (NEXIUM) 20 MG capsule Take 20 mg by mouth daily at 12 noon.    [provider]  ferrous sulfate 325 (65 FE) MG tablet TAKE 1 TABLET BY MOUTH TWICE A DAY WITH MEALS 12/26/21   Etta Grandchild, MD  metoprolol succinate (TOPROL XL) 25 MG 24 hr tablet Take 1 tablet (25 mg total) by mouth daily. 02/20/23   Sw[place], Peter M, MD  OXcarbazepine (TRILEPTAL) 150 MG tablet Take 75 mg by mouth daily.    [provider]  Potassium Gluconate 550 MG TABS Take 1 tablet by mouth daily.    [provider]  rivaroxaban (XARELTO) 10 MG TABS tablet Take 1 tablet (10 mg total) by mouth daily. 12/06/22   Etta Grandchild, MD  rosuvastatin (CRESTOR) 10 MG tablet Take 1 tablet (10 mg total) by mouth daily. 12/06/22   Etta Grandchild, MD    Physical Exam    Vital Signs:  Willie Black does not have vital signs available for review today.   Given telephonic nature of communication, physical exam is limited. AAOx3. NAD. Normal affect.  Speech and respirations are unlabored.  Accessory Clinical Findings    None  Assessment & Plan    1.  Preoperative Cardiovascular Risk Assessment:  According to the Revised Cardiac Risk Index (RCRI), his Perioperative Risk of Major Cardiac Event is (%): 0.4  His Functional Capacity in METs is: 9.89 according to the Duke Activity Status Index (DASI).   Per Pharmacy: Per office protocol, patient can hold Xarelto for 2 days prior to procedure.    The patient was advised that if he develops new symptoms prior to surgery to contact our office to arrange for a follow-up visit, and he verbalized understanding.  Therefore, based on ACC/AHA guidelines, patient would be at acceptable risk for the planned procedure without further cardiovascular testing. I will route this recommendation to the requesting party via Epic fax function.    Time:   Today, I have spent 10 minutes with the patient with telehealth technology  discussing medical history, symptoms, and management plan.     Joni Reining, NP  02/28/2023, 9:03 AM

## 2023-02-27 ENCOUNTER — Other Ambulatory Visit: Payer: Self-pay | Admitting: Internal Medicine

## 2023-02-27 DIAGNOSIS — D508 Other iron deficiency anemias: Secondary | ICD-10-CM

## 2023-02-28 ENCOUNTER — Encounter: Payer: Self-pay | Admitting: Gastroenterology

## 2023-02-28 ENCOUNTER — Ambulatory Visit: Payer: Medicare Other | Attending: Cardiology

## 2023-02-28 DIAGNOSIS — Z01810 Encounter for preprocedural cardiovascular examination: Secondary | ICD-10-CM

## 2023-02-28 DIAGNOSIS — Z01818 Encounter for other preprocedural examination: Secondary | ICD-10-CM

## 2023-02-28 NOTE — Telephone Encounter (Signed)
Left message to return call 

## 2023-02-28 NOTE — Telephone Encounter (Signed)
Patient has been notified and aware of holding his Xarelto 2 days before his colonoscopy.

## 2023-03-13 ENCOUNTER — Telehealth: Payer: Self-pay | Admitting: Gastroenterology

## 2023-03-13 NOTE — Telephone Encounter (Signed)
Called and spoke with [place]. Reviewed medication management. All questions answered

## 2023-03-13 NOTE — Telephone Encounter (Signed)
Patient called would like to know when he is to stop his Xarelto medication prior to is procedure.

## 2023-03-17 ENCOUNTER — Encounter: Payer: Self-pay | Admitting: Gastroenterology

## 2023-03-17 ENCOUNTER — Ambulatory Visit (AMBULATORY_SURGERY_CENTER): Payer: Medicare Other | Admitting: Gastroenterology

## 2023-03-17 VITALS — BP 123/66 | HR 56 | Temp 98.2°F | Resp 15 | Ht 74.0 in | Wt 247.0 lb

## 2023-03-17 DIAGNOSIS — D123 Benign neoplasm of transverse colon: Secondary | ICD-10-CM | POA: Diagnosis not present

## 2023-03-17 DIAGNOSIS — K625 Hemorrhage of anus and rectum: Secondary | ICD-10-CM | POA: Diagnosis not present

## 2023-03-17 DIAGNOSIS — D122 Benign neoplasm of ascending colon: Secondary | ICD-10-CM

## 2023-03-17 DIAGNOSIS — I10 Essential (primary) hypertension: Secondary | ICD-10-CM | POA: Diagnosis not present

## 2023-03-17 MED ORDER — SODIUM CHLORIDE 0.9 % IV SOLN: 500.0000 mL | INTRAVENOUS | Status: DC

## 2023-03-17 NOTE — Patient Instructions (Signed)
Resume previous diet and medications. Awaiting pathology results. Resume Xarelto at prior dose tomorrow.  Handouts provided FA:[id number] polyps, Diverticulosis and Hemorrhoid Banding (Done in the office)  YOU HAD AN ENDOSCOPIC PROCEDURE TODAY AT THE [facility]:   Refer to the procedure report that was given to you for any specific questions about what was found during the examination.  If the procedure report does not answer your questions, please call your gastroenterologist to clarify.  If you requested that your care partner not be given the details of your procedure findings, then the procedure report has been included in a sealed envelope for you to review at your convenience later.  YOU SHOULD EXPECT: Some feelings of bloating in the abdomen. Passage of more gas than usual.  Walking can help get rid of the air that was put into your GI tract during the procedure and reduce the bloating. If you had a lower endoscopy (such as a colonoscopy or flexible sigmoidoscopy) you may notice spotting of blood in your stool or on the toilet paper. If you underwent a bowel prep for your procedure, you may not have a normal bowel movement for a few days.  Please Note:  You might notice some irritation and congestion in your nose or some drainage.  This is from the oxygen used during your procedure.  There is no need for concern and it should clear up in a day or so.  SYMPTOMS TO REPORT IMMEDIATELY:  Following lower endoscopy (colonoscopy or flexible sigmoidoscopy):  Excessive amounts of blood in the stool  Significant tenderness or worsening of abdominal pains  Swelling of the abdomen that is new, acute  Fever of 100F or higher  For urgent or emergent issues, a gastroenterologist can be reached at any hour by calling [phone]. Do not use MyChart messaging for urgent concerns.    DIET:  We do recommend a small meal at first, but then you may proceed to your regular diet.  Drink plenty of  fluids but you should avoid alcoholic beverages for 24 hours.  ACTIVITY:  You should plan to take it easy for the rest of today and you should NOT DRIVE or use heavy machinery until tomorrow (because of the sedation medicines used during the test).    FOLLOW UP: Our staff will call the number listed on your records the next business day following your procedure.  We will call around 7:15- 8:00 am to check on you and address any questions or concerns that you may have regarding the information given to you following your procedure. If we do not reach you, we will leave a message.     If any biopsies were taken you will be contacted by phone or by letter within the next 1-3 weeks.  Please call us at [phone] if you have not heard about the biopsies in 3 weeks.    SIGNATURES/CONFIDENTIALITY: You and/or your care partner have signed paperwork which will be entered into your electronic medical record.  These signatures attest to the fact that that the information above on your After Visit Summary has been reviewed and is understood.  Full responsibility of the confidentiality of this discharge information lies with you and/or your care-partner.

## 2023-03-17 NOTE — Progress Notes (Signed)
Called to room to assist during endoscopic procedure.  Patient ID and intended procedure confirmed with present staff. Received instructions for my participation in the procedure from the performing physician.  

## 2023-03-17 NOTE — Progress Notes (Signed)
Sedate, gd SR, tolerated procedure well, VSS, report to RN

## 2023-03-17 NOTE — Op Note (Signed)
[facility] Patient Name: Willie Black Procedure Date: 03/17/2023 10:35 AM MRN: [id number] Endoscopist: Sherilyn Cooter L. Myrtie Neither , MD, [id number] Age: 72 Referring MD:  Date of Birth: 04/16/1951 Gender: Male Account #: [id number] Procedure:                Colonoscopy Indications:              Rectal bleeding Medicines:                Monitored Anesthesia Care Procedure:                Pre-Anesthesia Assessment:                           - Prior to the procedure, a History and Physical                            was performed, and patient medications and                            allergies were reviewed. The patient's tolerance of                            previous anesthesia was also reviewed. The risks                            and benefits of the procedure and the sedation                            options and risks were discussed with the patient.                            All questions were answered, and informed consent                            was obtained. Prior Anticoagulants: The patient has                            taken Xarelto (rivaroxaban), last dose was 2 days                            prior to procedure. ASA Grade Assessment: III - A                            patient with severe systemic disease. After                            reviewing the risks and benefits, the patient was                            deemed in satisfactory condition to undergo the                            procedure.  After obtaining informed consent, the colonoscope                            was passed under direct vision. Throughout the                            procedure, the patient's blood pressure, pulse, and                            oxygen saturations were monitored continuously. The                            Olympus CF-[id number]) Colonoscope was                            introduced through the anus and advanced to the the                             cecum, identified by appendiceal orifice and                            ileocecal valve. The colonoscopy was performed                            without difficulty. The patient tolerated the                            procedure well. The quality of the bowel                            preparation was good. The ileocecal valve,                            appendiceal orifice, and rectum were photographed. Scope In: 10:57:32 AM Scope Out: 11:13:45 AM Scope Withdrawal Time: 0 hours 13 minutes 49 seconds  Total Procedure Duration: 0 hours 16 minutes 13 seconds  Findings:                 The perianal and digital rectal examinations were                            normal.                           Repeat examination of right colon under NBI                            performed.                           Five sessile polyps were found in the ascending                            colon. The polyps were diminutive in size. These  polyps were removed with a cold snare. Resection                            and retrieval were complete.                           Two flat and sessile polyps were found in the                            transverse colon. The polyps were 2 to 5 mm in                            size. These polyps were removed with a cold snare.                            Resection and retrieval were complete.                           Multiple diverticula were found in the entire colon.                           Internal hemorrhoids were found. The hemorrhoids                            were medium-sized. (anterior column at about 12                            o'clock position most prominent)                           The exam was otherwise without abnormality on                            direct and retroflexion views. Complications:            No immediate complications. Estimated Blood Loss:     Estimated blood loss was minimal. Impression:                - Five diminutive polyps in the ascending colon,                            removed with a cold snare. Resected and retrieved.                           - Two 2 to 5 mm polyps in the transverse colon,                            removed with a cold snare. Resected and retrieved.                           - Diverticulosis in the entire examined colon.                           - Internal hemorrhoids.                           -  The examination was otherwise normal on direct                            and retroflexion views. Recommendation:           - Patient has a contact number available for                            emergencies. The signs and symptoms of potential                            delayed complications were discussed with the                            patient. Return to normal activities tomorrow.                            Written discharge instructions were provided to the                            patient.                           - Resume previous diet.                           - Resume Xarelto (rivaroxaban) at prior dose                            tomorrow.                           - Await pathology results.                           - Repeat colonoscopy is recommended for                            surveillance. The colonoscopy date will be                            determined after pathology results from today's                            exam become available for review.                           - Return to my office as needed, particularly if                            interested in hemorrhoidal banding. Deborah Dondero L. Myrtie Neither, MD 03/17/2023 11:21:54 AM This report has been signed electronically.

## 2023-03-17 NOTE — Progress Notes (Signed)
Pt's states no medical or surgical changes since previsit or office visit. 

## 2023-03-17 NOTE — Progress Notes (Signed)
History and Physical:  This patient presents for endoscopic testing for: Encounter Diagnosis  Name Primary?   Rectal bleeding Yes    Clinical details in 01/16/23 office note. Episodic rectal bleeding, colonoscopic evaluation today. Has been off OAC 2 days.  Patient is otherwise without complaints or active issues today.   Past Medical History: Past Medical History:  Diagnosis Date   Anxiety    CAD (coronary artery disease)    a. s/p CABG in 2011 with LIMA-LAD, SVG-OM, and SVG-PDA b. low-risk NST in 08/2015   GERD (gastroesophageal reflux disease)    Heart attack (HCC) 2011   Hypercholesteremia    Pneumothorax 03/18/2019   LEFT SIDE   PVC (premature ventricular contraction)    Bigeminy PVC's on event monitor Nov 2012   Renal calculi    Shingles    Trigeminal neuralgia of right side of face      Past Surgical History: Past Surgical History:  Procedure Laterality Date   CERVICAL DISC SURGERY     CORONARY ARTERY BYPASS GRAFT  05/28/2110   x3 with LIMA to LAD, SVG to OM and SVG to LAD after having ST elevation with coming off pump   KNEE ARTHROSCOPY     NECK SURGERY     STAPLING OF BLEBS Left 03/24/2019   Procedure: STAPLING OF BLEBS;  Surgeon: Alleen Borne, MD;  Location: [facility] OR;  Service: Thoracic;  Laterality: Left;   VIDEO ASSISTED THORACOSCOPY Left 03/24/2019   Procedure: VIDEO ASSISTED THORACOSCOPY;  Surgeon: Alleen Borne, MD;  Location: [facility] OR;  Service: Thoracic;  Laterality: Left;    Allergies: Allergies  Allergen Reactions   Penicillins Rash    Did it involve swelling of the face/tongue/throat, SOB, or low BP? Unknown Did it involve sudden or severe rash/hives, skin peeling, or any reaction on the inside of your mouth or nose? Unknown Did you need to seek medical attention at a hospital or doctor's office? Unknown When did it last happen?      childhood If all above answers are "NO", may proceed with cephalosporin use.     Outpatient Meds: Current  Outpatient Medications  Medication Sig Dispense Refill   Cholecalciferol (VITAMIN D3 PO) Take 1 tablet by mouth daily.     Cyanocobalamin (VITAMIN B-12 PO) Take 1 capsule by mouth daily.     escitalopram (LEXAPRO) 20 MG tablet Take 20 mg by mouth daily.     esomeprazole (NEXIUM) 20 MG capsule Take 20 mg by mouth daily at 12 noon.     metoprolol succinate (TOPROL XL) 25 MG 24 hr tablet Take 1 tablet (25 mg total) by mouth daily. 90 tablet 0   OXcarbazepine (TRILEPTAL) 150 MG tablet Take 75 mg by mouth daily.     Potassium Gluconate 550 MG TABS Take 1 tablet by mouth daily.     rosuvastatin (CRESTOR) 10 MG tablet Take 1 tablet (10 mg total) by mouth daily. 90 tablet 1   ferrous sulfate 325 (65 FE) MG tablet TAKE 1 TABLET BY MOUTH TWICE A DAY WITH FOOD 180 tablet 0   rivaroxaban (XARELTO) 10 MG TABS tablet Take 1 tablet (10 mg total) by mouth daily. 90 tablet 1   Current Facility-Administered Medications  Medication Dose Route Frequency Provider Last Rate Last Admin   0.9 %  sodium chloride infusion  500 mL Intravenous Continuous Danis, Starr Lake III, MD          ___________________________________________________________________ Objective   Exam:  BP 134/77   Pulse 65  Temp 98.2 F (36.8 C)   Resp 14   Ht 6\' 2"  (1.88 m)   Wt 247 lb (112 kg)   SpO2 99%   BMI 31.71 kg/m   CV: regular , S1/S2 Resp: clear to auscultation bilaterally, normal RR and effort noted GI: soft, no tenderness, with active bowel sounds.   Assessment: Encounter Diagnosis  Name Primary?   Rectal bleeding Yes     Plan: Colonoscopy   The benefits and risks of the planned procedure were described in detail with the patient or (when appropriate) their health care proxy.  Risks were outlined as including, but not limited to, bleeding, infection, perforation, adverse medication reaction leading to cardiac or pulmonary decompensation, pancreatitis (if ERCP).  The limitation of incomplete mucosal  visualization was also discussed.  No guarantees or warranties were given.  The patient is appropriate for an endoscopic procedure in the ambulatory setting.   - Amada Jupiter, MD

## 2023-03-18 ENCOUNTER — Telehealth: Payer: Self-pay | Admitting: *Deleted

## 2023-03-18 NOTE — Telephone Encounter (Signed)
  Follow up Call-     03/17/2023    9:54 AM  Call back number  Post procedure Call Back phone  # [phone]  Permission to leave phone message Yes     Patient questions:   Message left to call us if necessary.

## 2023-03-19 ENCOUNTER — Encounter: Payer: Self-pay | Admitting: Gastroenterology

## 2023-03-19 LAB — SURGICAL PATHOLOGY

## 2023-05-14 ENCOUNTER — Encounter: Payer: Self-pay | Admitting: Emergency Medicine

## 2023-05-14 ENCOUNTER — Telehealth: Payer: Medicare Other | Admitting: Emergency Medicine

## 2023-05-14 DIAGNOSIS — U071 COVID-19: Secondary | ICD-10-CM | POA: Diagnosis not present

## 2023-05-14 DIAGNOSIS — R6889 Other general symptoms and signs: Secondary | ICD-10-CM | POA: Diagnosis not present

## 2023-05-14 MED ORDER — MOLNUPIRAVIR EUA 200MG CAPSULE: 4.0000 | ORAL_CAPSULE | Freq: Two times a day (BID) | ORAL | 0 refills | Status: AC

## 2023-05-14 NOTE — Assessment & Plan Note (Signed)
Advised to rest and stay well-hydrated Symptom management discussed Advised to contact the office if no better or worse during the next several days

## 2023-05-14 NOTE — Progress Notes (Signed)
Telemedicine Encounter- SOAP NOTE Established Patient MyChart video encounter Patient: Home  Provider: Office   Patient present only  This video encounter was conducted with the patient's (or proxy's) verbal consent via video telecommunications: yes/no: Yes Patient was instructed to have this encounter in a suitably private space; and to only have persons present to whom they give permission to participate. In addition, patient identity was confirmed by use of name plus two identifiers (DOB and address).  Chief complaint: Flulike symptoms.  Positive COVID test.  Subjective  Willie Black is a 72 y.o. male established patient. Telephone visit today complaining of flu like symptoms that started Sunday evening, about 2 days ago.  Tested positive for COVID this morning.  Denies difficulty breathing or chest pain.  Able to eat and drink.  Denies nausea or vomiting.  Denies abdominal pain or diarrhea.  Had some fever and chills.  No other associated symptoms No other complaints or medical concerns today.  HPI ? Patient Active Problem List   Diagnosis Date Noted   Encounter for general adult medical examination with abnormal findings 12/05/2022   Heme positive stool 12/05/2022   Right facial numbness 12/03/2021   Hypertension 12/03/2021   Erectile dysfunction due to arterial disease (HCC) 05/12/2020   Screen for colon cancer 05/12/2020   Coronary artery disease involving native coronary artery of native heart without angina pectoris 05/11/2020   Iron deficiency anemia due to dietary causes 05/11/2020   Other primary thrombophilia (HCC) 05/10/2020   Sensorineural hearing loss (SNHL) of both ears 05/10/2020   Flu vaccine need 05/10/2020   Insomnia due to medical condition 04/01/2019   Abnormal brain MRI 07/26/2018   Benign prostatic hyperplasia without lower urinary tract symptoms 07/15/2018   Hypercoagulable state (HCC) 02/25/2018   Trigeminal neuralgia of right side of face  05/15/2016   PVC's (premature ventricular contractions) 04/19/2014   Abdominal aortic aneurysm (HCC) 09/30/2013   Essential hypertension 05/15/2011   GERD (gastroesophageal reflux disease)    Hyperlipidemia LDL goal <70    PULMONARY NODULE 04/12/2010   Past Medical History:  Diagnosis Date   Anxiety    CAD (coronary artery disease)    a. s/p CABG in 2011 with LIMA-LAD, SVG-OM, and SVG-PDA b. low-risk NST in 08/2015   GERD (gastroesophageal reflux disease)    Heart attack (HCC) 2011   Hypercholesteremia    Pneumothorax 03/18/2019   LEFT SIDE   PVC (premature ventricular contraction)    Bigeminy PVC's on event monitor Nov 2012   Renal calculi    Shingles    Trigeminal neuralgia of right side of face    Current Outpatient Medications  Medication Sig Dispense Refill   molnupiravir EUA (LAGEVRIO) 200 mg CAPS capsule Take 4 capsules (800 mg total) by mouth 2 (two) times daily for 5 days. 40 capsule 0   Cholecalciferol (VITAMIN D3 PO) Take 1 tablet by mouth daily.     Cyanocobalamin (VITAMIN B-12 PO) Take 1 capsule by mouth daily.     escitalopram (LEXAPRO) 20 MG tablet Take 20 mg by mouth daily.     esomeprazole (NEXIUM) 20 MG capsule Take 20 mg by mouth daily at 12 noon.     ferrous sulfate 325 (65 FE) MG tablet TAKE 1 TABLET BY MOUTH TWICE A DAY WITH FOOD 180 tablet 0   metoprolol succinate (TOPROL XL) 25 MG 24 hr tablet Take 1 tablet (25 mg total) by mouth daily. 90 tablet 0   OXcarbazepine (TRILEPTAL) 150 MG tablet Take 75 mg  by mouth daily.     Potassium Gluconate 550 MG TABS Take 1 tablet by mouth daily.     rivaroxaban (XARELTO) 10 MG TABS tablet Take 1 tablet (10 mg total) by mouth daily. 90 tablet 1   rosuvastatin (CRESTOR) 10 MG tablet Take 1 tablet (10 mg total) by mouth daily. 90 tablet 1   No current facility-administered medications for this visit.   Allergies  Allergen Reactions   Penicillins Rash    Did it involve swelling of the face/tongue/throat, SOB, or low  BP? Unknown Did it involve sudden or severe rash/hives, skin peeling, or any reaction on the inside of your mouth or nose? Unknown Did you need to seek medical attention at a hospital or doctor's office? Unknown When did it last happen?      childhood If all above answers are "NO", may proceed with cephalosporin use.    Social History   Socioeconomic History   Marital status: Married    Spouse name: Not on file   Number of children: 2   Years of education: 12   Highest education level: Not on file  Occupational History   Occupation: Investment banker, corporate: [employer]  Tobacco Use   Smoking status: Former    Current packs/day: 0.00    Types: Cigarettes    Quit date: 04/27/2009    Years since quitting: 14.0   Smokeless tobacco: Never  Vaping Use   Vaping status: Never Used  Substance and Sexual Activity   Alcohol use: No   Drug use: No   Sexual activity: Yes  Other Topics Concern   Not on file  Social History Narrative   Lives at home with his wife.   Right-handed.   2 cups caffeine daily.   Social Determinants of Health   Financial Resource Strain: Low Risk  (12/07/2020)   Overall Financial Resource Strain (CARDIA)    Difficulty of Paying Living Expenses: Not very hard  Food Insecurity: No Food Insecurity (02/06/2022)   Hunger Vital Sign    Worried About Running Out of Food in the Last Year: Never true    Ran Out of Food in the Last Year: Never true  Transportation Needs: No Transportation Needs (02/06/2022)   PRAPARE - Administrator, Civil Service (Medical): No    Lack of Transportation (Non-Medical): No  Physical Activity: Not on file  Stress: Not on file  Social Connections: Not on file  Intimate Partner Violence: Not on file   Review of Systems  Constitutional:  Positive for fever.  HENT:  Positive for congestion. Negative for sore throat.   Respiratory:  Positive for cough. Negative for shortness of breath.   Cardiovascular:  Negative for  chest pain.  Gastrointestinal:  Negative for abdominal pain, nausea and vomiting.  Genitourinary: Negative.  Negative for dysuria and hematuria.  Skin: Negative.  Negative for rash.  Neurological: Negative.  Negative for dizziness and headaches.  All other systems reviewed and are negative.  Objective  Alert and oriented x 3 in no apparent respiratory distress Vitals as reported by the patient: Problem List Items Addressed This Visit       Other   COVID-19 virus infection - Primary    Clinically stable without complications. High risk patient. Recommend to start antiviral medication Patient on Xarelto.  Paxlovid not recommended Recommend to start molnupiravir 800 mg twice a day for 5 days ED precautions given Advised to rest and stay well-hydrated Symptom management discussed Advised to  contact the office if no better or worse during the next several days.      Relevant Medications   molnupiravir EUA (LAGEVRIO) 200 mg CAPS capsule   Flu-like symptoms    Advised to rest and stay well-hydrated Symptom management discussed Advised to contact the office if no better or worse during the next several days        I discussed the assessment and treatment plan with the patient. The patient was provided an opportunity to ask questions and all were answered. The patient agreed with the plan and demonstrated an understanding of the instructions.   The patient was advised to call back or seek an in-person evaluation if the symptoms worsen or if the condition fails to improve as anticipated.  I provided 30 minutes of non-face-to-face time during this encounter including time spent preparing for this visit, reviewing last office visit notes, review of multiple chronic medical conditions under management, review of all medications, diagnosis of COVID infection, prognosis, ED precautions, symptom management, prognosis, documentation and need for follow-up if no better or worse during the next  several days  Dr. Lillie Fragmin primary care at [facility]

## 2023-05-14 NOTE — Assessment & Plan Note (Signed)
Clinically stable without complications. High risk patient. Recommend to start antiviral medication Patient on Xarelto.  Paxlovid not recommended Recommend to start molnupiravir 800 mg twice a day for 5 days ED precautions given Advised to rest and stay well-hydrated Symptom management discussed Advised to contact the office if no better or worse during the next several days.

## 2023-05-17 ENCOUNTER — Telehealth: Payer: Medicare Other | Admitting: Family Medicine

## 2023-05-17 DIAGNOSIS — T7840XA Allergy, unspecified, initial encounter: Secondary | ICD-10-CM | POA: Diagnosis not present

## 2023-05-17 DIAGNOSIS — R21 Rash and other nonspecific skin eruption: Secondary | ICD-10-CM

## 2023-05-17 MED ORDER — PREDNISONE 20 MG PO TABS: 20.0000 mg | ORAL_TABLET | Freq: Two times a day (BID) | ORAL | 0 refills | Status: AC

## 2023-05-17 NOTE — Patient Instructions (Signed)
Drug Rash  A drug rash occurs when a medicine causes a change in the color or texture of the skin. It can develop minutes, hours, or days after you take the medicine. The rash may appear on a small area of skin or all over your body. What are the causes? This condition may be caused by one of these three conditions: An allergic reaction to the medicine. An unwanted side effect of a certain medicine. Extreme sensitivity to sunlight caused by the medicine. What increases the risk? If you take any of these medicines that make your skin sensitive to light and are exposed to sunlight, it can make you more likely to develop this condition: Antibiotics, including tetracyclines and sulfa medicines. Antifungals. Antihistamines. Diuretics. Retinoids, such as isotretinoin. Statins. NSAIDs. What are the signs or symptoms? Symptoms of this condition include: Redness. Tiny bumps. Peeling. Itching. Itchy welts (hives). Swelling. How is this diagnosed? This condition may be diagnosed based on: A physical exam. Tests to find out which medicine caused the rash. These tests may include: Skin tests. Blood tests. How is this treated? This condition is treated with medicines, including: Antihistamine. This may be given to relieve itching. NSAIDs. These may be given to reduce swelling and to treat pain. A steroid medicine. This may be given to reduce swelling. The rash usually goes away when you stop taking the medicine that caused it. Follow these instructions at home: Take over-the-counter and prescription medicines only as told by your health care provider. Tell all your health care providers about any medicine reactions that you have had in the past. If your rash was caused by sensitivity to sunlight, and while your rash is healing: Avoid being in the sun if possible, especially when it is strongest, usually between 10 a.m. and 4 p.m. Cover your skin with pants, long sleeves, and a hat when  you are exposed to sunlight. If you have hives: Take a cool shower or use a cool compress to relieve itchiness. Take over-the-counter antihistamines, as recommended by your health care provider, until the hives are gone. Hives are not contagious. Keep all follow-up visits. This is important. Contact a health care provider if: You have fever. Your rash is not going away. Your rash gets worse. Your rash comes back. You have high-pitched whistling sounds when you breathe, most often when you breathe out (wheezing) or coughing. Get help right away if: You start to have breathing problems. You start to have shortness of breath. Your face or throat starts to swell. You have severe weakness with dizziness or fainting. You have chest pain. Your skin starts to blister and peel. These symptoms may represent a serious problem that is an emergency. Do not wait to see if the symptoms will go away. Get medical help right away. Call your local emergency services (911 in the [place] Do not drive yourself to the hospital. Summary A drug rash occurs when a medicine causes a change in the color or texture of the skin. The rash may appear on a small area of skin or all over your body. It can develop minutes, hours, or days after you take the medicine. Your health care provider will do various tests to determine what medicine caused your rash. The rash may be treated with medicine to relieve itching, swelling, and pain. This information is not intended to replace advice given to you by your health care provider. Make sure you discuss any questions you have with your health care provider. Document Revised: 11/20/2020  Document Reviewed: 11/20/2020 Elsevier Patient Education  2024 [employer].

## 2023-05-17 NOTE — Progress Notes (Signed)
Virtual Visit Consent   Willie Black, you are scheduled for a virtual visit with a [facility] provider today. Just as with appointments in the office, your consent must be obtained to participate. Your consent will be active for this visit and any virtual visit you may have with one of our providers in the next 365 days. If you have a MyChart account, a copy of this consent can be sent to you electronically.  As this is a virtual visit, video technology does not allow for your provider to perform a traditional examination. This may limit your provider's ability to fully assess your condition. If your provider identifies any concerns that need to be evaluated in person or the need to arrange testing (such as labs, EKG, etc.), we will make arrangements to do so. Although advances in technology are sophisticated, we cannot ensure that it will always work on either your end or our end. If the connection with a video visit is poor, the visit may have to be switched to a telephone visit. With either a video or telephone visit, we are not always able to ensure that we have a secure connection.  By engaging in this virtual visit, you consent to the provision of healthcare and authorize for your insurance to be billed (if applicable) for the services provided during this visit. Depending on your insurance coverage, you may receive a charge related to this service.  I need to obtain your verbal consent now. Are you willing to proceed with your visit today? LELON REHRER has provided verbal consent on 05/17/2023 for a virtual visit (video or telephone). Georgana Curio, FNP  Date: 05/17/2023 11:59 AM  Virtual Visit via Video Note   I, Georgana Curio, connected with  Willie Black  ([id number], [id number]) on 05/17/23 at 12:15 PM EST by a video-enabled telemedicine application and verified that I am speaking with the correct person using two identifiers.  Location: Patient: Virtual Visit  Location Patient: Home Provider: Virtual Visit Location Provider: Home Office   I discussed the limitations of evaluation and management by telemedicine and the availability of in person appointments. The patient expressed understanding and agreed to proceed.    History of Present Illness: Willie Black is a 72 y.o. who identifies as a male who was assigned male at birth, and is being seen today for covid positive testing this week, started on molnupirivir and now has a rash across lower abdomen that itches. Covid sx have improved. No fever, chills and in no distress. Marland Kitchen  HPI: HPI  Problems:  Patient Active Problem List   Diagnosis Date Noted   COVID-19 virus infection 05/14/2023   Flu-like symptoms 05/14/2023   Encounter for general adult medical examination with abnormal findings 12/05/2022   Heme positive stool 12/05/2022   Right facial numbness 12/03/2021   Hypertension 12/03/2021   Erectile dysfunction due to arterial disease (HCC) 05/12/2020   Screen for colon cancer 05/12/2020   Coronary artery disease involving native coronary artery of native heart without angina pectoris 05/11/2020   Iron deficiency anemia due to dietary causes 05/11/2020   Other primary thrombophilia (HCC) 05/10/2020   Sensorineural hearing loss (SNHL) of both ears 05/10/2020   Flu vaccine need 05/10/2020   Insomnia due to medical condition 04/01/2019   Abnormal brain MRI 07/26/2018   Benign prostatic hyperplasia without lower urinary tract symptoms 07/15/2018   Hypercoagulable state (HCC) 02/25/2018   Trigeminal neuralgia of right side of face 05/15/2016   PVC's (premature  ventricular contractions) 04/19/2014   Abdominal aortic aneurysm (HCC) 09/30/2013   Essential hypertension 05/15/2011   GERD (gastroesophageal reflux disease)    Hyperlipidemia LDL goal <70    PULMONARY NODULE 04/12/2010    Allergies:  Allergies  Allergen Reactions   Penicillins Rash    Did it involve swelling of the  face/tongue/throat, SOB, or low BP? Unknown Did it involve sudden or severe rash/hives, skin peeling, or any reaction on the inside of your mouth or nose? Unknown Did you need to seek medical attention at a hospital or doctor's office? Unknown When did it last happen?      childhood If all above answers are "NO", may proceed with cephalosporin use.    Medications:  Current Outpatient Medications:    predniSONE (DELTASONE) 20 MG tablet, Take 1 tablet (20 mg total) by mouth 2 (two) times daily with a meal for 5 days., Disp: 10 tablet, Rfl: 0   Cholecalciferol (VITAMIN D3 PO), Take 1 tablet by mouth daily., Disp: , Rfl:    Cyanocobalamin (VITAMIN B-12 PO), Take 1 capsule by mouth daily., Disp: , Rfl:    escitalopram (LEXAPRO) 20 MG tablet, Take 20 mg by mouth daily., Disp: , Rfl:    esomeprazole (NEXIUM) 20 MG capsule, Take 20 mg by mouth daily at 12 noon., Disp: , Rfl:    ferrous sulfate 325 (65 FE) MG tablet, TAKE 1 TABLET BY MOUTH TWICE A DAY WITH FOOD, Disp: 180 tablet, Rfl: 0   metoprolol succinate (TOPROL XL) 25 MG 24 hr tablet, Take 1 tablet (25 mg total) by mouth daily., Disp: 90 tablet, Rfl: 0   molnupiravir EUA (LAGEVRIO) 200 mg CAPS capsule, Take 4 capsules (800 mg total) by mouth 2 (two) times daily for 5 days., Disp: 40 capsule, Rfl: 0   OXcarbazepine (TRILEPTAL) 150 MG tablet, Take 75 mg by mouth daily., Disp: , Rfl:    Potassium Gluconate 550 MG TABS, Take 1 tablet by mouth daily., Disp: , Rfl:    rivaroxaban (XARELTO) 10 MG TABS tablet, Take 1 tablet (10 mg total) by mouth daily., Disp: 90 tablet, Rfl: 1   rosuvastatin (CRESTOR) 10 MG tablet, Take 1 tablet (10 mg total) by mouth daily., Disp: 90 tablet, Rfl: 1  Observations/Objective: Patient is well-developed, well-nourished in no acute distress.  Resting comfortably  at home.  Head is normocephalic, atraumatic.  No labored breathing.  Speech is clear and coherent with logical content.  Patient is alert and oriented at  baseline.    Assessment and Plan: 1. Rash  2. Allergic reaction to drug, initial encounter  Push fluids, continue benadryl cream, stop molnupirivir. UC if sx worsen.   Follow Up Instructions: I discussed the assessment and treatment plan with the patient. The patient was provided an opportunity to ask questions and all were answered. The patient agreed with the plan and demonstrated an understanding of the instructions.  A copy of instructions were sent to the patient via MyChart unless otherwise noted below.     The patient was advised to call back or seek an in-person evaluation if the symptoms worsen or if the condition fails to improve as anticipated.    Georgana Curio, FNP

## 2023-05-20 ENCOUNTER — Other Ambulatory Visit: Payer: Self-pay | Admitting: Cardiology

## 2023-05-28 ENCOUNTER — Other Ambulatory Visit: Payer: Self-pay | Admitting: Internal Medicine

## 2023-05-28 DIAGNOSIS — D508 Other iron deficiency anemias: Secondary | ICD-10-CM

## 2023-05-29 ENCOUNTER — Other Ambulatory Visit: Payer: Self-pay | Admitting: Internal Medicine

## 2023-05-29 DIAGNOSIS — E785 Hyperlipidemia, unspecified: Secondary | ICD-10-CM

## 2023-05-29 DIAGNOSIS — D6859 Other primary thrombophilia: Secondary | ICD-10-CM

## 2023-05-29 DIAGNOSIS — I2510 Atherosclerotic heart disease of native coronary artery without angina pectoris: Secondary | ICD-10-CM

## 2023-06-01 ENCOUNTER — Other Ambulatory Visit: Payer: Self-pay | Admitting: Internal Medicine

## 2023-06-01 DIAGNOSIS — D6859 Other primary thrombophilia: Secondary | ICD-10-CM

## 2023-06-02 ENCOUNTER — Other Ambulatory Visit: Payer: Self-pay

## 2023-06-02 ENCOUNTER — Telehealth: Payer: Self-pay | Admitting: Internal Medicine

## 2023-06-02 DIAGNOSIS — D6859 Other primary thrombophilia: Secondary | ICD-10-CM

## 2023-06-02 MED ORDER — RIVAROXABAN 10 MG PO TABS: 10.0000 mg | ORAL_TABLET | Freq: Every day | ORAL | 0 refills | Status: DC

## 2023-06-02 NOTE — Telephone Encounter (Signed)
Prescription Request  06/02/2023  LOV: 12/05/2022  What is the name of the medication or equipment? rivaroxaban (XARELTO) 10 MG TABS tablet [   Have you contacted your pharmacy to request a refill? No   Which pharmacy would you like this sent to?  [id number] [place], [place] - [address] [place] [address] Phone: [phone] Fax: [phone]    Patient notified that their request is being sent to the clinical staff for review and that they should receive a response within 2 business days.   Please advise at Mobile [phone] (mobile)

## 2023-06-02 NOTE — Telephone Encounter (Signed)
 Medication refill sent to Dr. Yetta Barre

## 2023-06-10 ENCOUNTER — Encounter: Payer: Self-pay | Admitting: Internal Medicine

## 2023-06-10 ENCOUNTER — Ambulatory Visit: Payer: Medicare Other | Admitting: Internal Medicine

## 2023-06-10 VITALS — BP 138/72 | HR 66 | Temp 98.1°F | Resp 16 | Ht 74.0 in | Wt 252.2 lb

## 2023-06-10 DIAGNOSIS — D6859 Other primary thrombophilia: Secondary | ICD-10-CM | POA: Diagnosis not present

## 2023-06-10 DIAGNOSIS — D508 Other iron deficiency anemias: Secondary | ICD-10-CM | POA: Diagnosis not present

## 2023-06-10 DIAGNOSIS — E785 Hyperlipidemia, unspecified: Secondary | ICD-10-CM

## 2023-06-10 DIAGNOSIS — I10 Essential (primary) hypertension: Secondary | ICD-10-CM | POA: Diagnosis not present

## 2023-06-10 DIAGNOSIS — I2510 Atherosclerotic heart disease of native coronary artery without angina pectoris: Secondary | ICD-10-CM

## 2023-06-10 LAB — BASIC METABOLIC PANEL
BUN: 16 mg/dL (ref 6–23)
CO2: 32 meq/L (ref 19–32)
Calcium: 9 mg/dL (ref 8.4–10.5)
Chloride: 102 meq/L (ref 96–112)
Creatinine, Ser: 1.02 mg/dL (ref 0.40–1.50)
GFR: 74 mL/min (ref >60.00)
Glucose, Bld: 82 mg/dL (ref 70–99)
Potassium: 4.1 meq/L (ref 3.5–5.1)
Sodium: 139 meq/L (ref 135–145)

## 2023-06-10 LAB — CBC WITH DIFFERENTIAL/PLATELET
Basophils Absolute: 0 10*3/uL (ref 0.0–0.1)
Basophils Relative: 1 % (ref 0.0–3.0)
Eosinophils Absolute: 0.2 10*3/uL (ref 0.0–0.7)
Eosinophils Relative: 4.5 % (ref 0.0–5.0)
HCT: 39.6 % (ref 39.0–52.0)
Hemoglobin: 13 g/dL (ref 13.0–17.0)
Lymphocytes Relative: 29 % (ref 12.0–46.0)
Lymphs Abs: 1.2 10*3/uL (ref 0.7–4.0)
MCHC: 32.9 g/dL (ref 30.0–36.0)
MCV: 94.8 fL (ref 78.0–100.0)
Monocytes Absolute: 0.6 10*3/uL (ref 0.1–1.0)
Monocytes Relative: 14.1 % — ABNORMAL HIGH (ref 3.0–12.0)
Neutro Abs: 2.1 10*3/uL (ref 1.4–7.7)
Neutrophils Relative %: 51.4 % (ref 43.0–77.0)
Platelets: 181 10*3/uL (ref 150.0–400.0)
RBC: 4.17 Mil/uL — ABNORMAL LOW (ref 4.22–5.81)
RDW: 14.4 % (ref 11.5–15.5)
WBC: 4.1 10*3/uL (ref 4.0–10.5)

## 2023-06-10 MED ORDER — RIVAROXABAN 10 MG PO TABS: 10.0000 mg | ORAL_TABLET | Freq: Every day | ORAL | 1 refills | Status: DC

## 2023-06-10 MED ORDER — ROSUVASTATIN CALCIUM 10 MG PO TABS: 10.0000 mg | ORAL_TABLET | Freq: Every day | ORAL | 1 refills | Status: DC

## 2023-06-10 NOTE — Progress Notes (Unsigned)
Subjective:  Patient ID: Willie Black, male    DOB: 18-Oct-1950  Age: 72 y.o. MRN: [id number]  CC: Hypertension   HPI ASAF MICCICHE presents for f/up ----  Discussed the use of AI scribe software for clinical note transcription with the patient, who gave verbal consent to proceed.  History of Present Illness   The patient, with a history of heart surgery and on metoprolol and blood thinners, reports no current heart symptoms such as chest pain or shortness of breath despite playing golf. He denies any swelling in his legs or feet. He mentions that he takes metoprolol at night and has not experienced any side effects such as palpitations. He also reports no issues with bleeding or bruising since the dosage of his blood thinner was reduced. Previously, he experienced significant bleeding which has since resolved with the adjustment of his medication.  The patient remains active, engaging in activities such as golf. He reports no issues with weakness, dizziness, or lightheadedness despite having a generally low blood pressure. He does, however, mention a tendency to fall asleep easily after work.  He also reports a history of hemorrhoids. He denies any current issues with blood in his stool.       Outpatient Medications Prior to Visit  Medication Sig Dispense Refill   Cholecalciferol (VITAMIN D3 PO) Take 1 tablet by mouth daily.     Cyanocobalamin (VITAMIN B-12 PO) Take 1 capsule by mouth daily.     escitalopram (LEXAPRO) 20 MG tablet Take 20 mg by mouth daily.     esomeprazole (NEXIUM) 20 MG capsule Take 20 mg by mouth daily at 12 noon.     ferrous sulfate 325 (65 FE) MG tablet TAKE 1 TABLET BY MOUTH TWICE A DAY WITH FOOD (Patient taking differently: Take 325 mg by mouth daily.) 180 tablet 0   metoprolol succinate (TOPROL-XL) 25 MG 24 hr tablet TAKE 1 TABLET (25 MG TOTAL) BY MOUTH DAILY. 90 tablet 0   OXcarbazepine (TRILEPTAL) 150 MG tablet Take 75 mg by mouth daily.      Potassium Gluconate 550 MG TABS Take 1 tablet by mouth daily.     rivaroxaban (XARELTO) 10 MG TABS tablet Take 1 tablet (10 mg total) by mouth daily. 30 tablet 0   rosuvastatin (CRESTOR) 10 MG tablet Take 1 tablet (10 mg total) by mouth daily. 90 tablet 1   No facility-administered medications prior to visit.    ROS Review of Systems  Objective:  BP 138/72 (BP Location: Left Arm, Patient Position: Sitting, Cuff Size: Normal)   Pulse 66   Temp 98.1 F (36.7 C) (Oral)   Resp 16   Ht 6\' 2"  (1.88 m)   Wt 252 lb 3.2 oz (114.4 kg)   SpO2 97%   BMI 32.38 kg/m   BP Readings from Last 3 Encounters:  06/10/23 138/72  03/17/23 123/66  01/16/23 102/62    Wt Readings from Last 3 Encounters:  06/10/23 252 lb 3.2 oz (114.4 kg)  03/17/23 247 lb (112 kg)  01/16/23 247 lb 8 oz (112.3 kg)    Physical Exam Cardiovascular:     Rate and Rhythm: Normal rate and regular rhythm.     Comments: EKG- NSR, 65 bpm No LVH, Q waves, or ST/T waves     Lab Results  Component Value Date   WBC 5.3 12/05/2022   HGB 13.6 12/05/2022   HCT 42.0 12/05/2022   PLT 211.0 12/05/2022   GLUCOSE 73 12/05/2022   CHOL 139  12/05/2022   TRIG 128.0 12/05/2022   HDL 44.30 12/05/2022   LDLDIRECT 74 02/28/2022   LDLCALC 69 12/05/2022   ALT 11 12/05/2022   AST 15 12/05/2022   NA 141 12/05/2022   K 4.2 12/05/2022   CL 102 12/05/2022   CREATININE 1.30 12/05/2022   BUN 19 12/05/2022   CO2 30 12/05/2022   TSH 1.46 12/05/2022   PSA 0.86 12/05/2022   INR 0.97 11/22/2010    VAS [place] AAA DUPLEX Result Date: 11/25/2022 ABDOMINAL AORTA STUDY Patient Name:  REGINAL ADVANI  Date of Exam:   11/25/2022 Medical Rec #: [id number]             Accession #:    [id number] Date of Birth: Feb 11, 1951             Patient Gender: M Patient Age:   39 years Exam Location:  [facility] Procedure:      VAS [place] AAA DUPLEX Referring Phys: Sanda Linger  --------------------------------------------------------------------------------  Indications: Follow up exam for known AAA. Risk Factors: Hypertension, hyperlipidemia, past history of smoking, coronary               artery disease. Other Factors: Hx CABG. Limitations: Air/bowel gas.  Comparison Study: 11/15/21 AAA duplex Performing Technologist: Lowell Guitar RVT, RDMS  Examination Guidelines: A complete evaluation includes B-mode imaging, spectral Doppler, color Doppler, and power Doppler as needed of all accessible portions of each vessel. Bilateral testing is considered an integral part of a complete examination. Limited examinations for reoccurring indications may be performed as noted.  Abdominal Aorta Findings: +-----------+-------+----------+----------+---------+--------+--------+ Location   AP (cm)Trans (cm)PSV (cm/s)Waveform ThrombusComments +-----------+-------+----------+----------+---------+--------+--------+ Proximal                    78        triphasic                 +-----------+-------+----------+----------+---------+--------+--------+ Mid        2.40   2.16      98        triphasic                 +-----------+-------+----------+----------+---------+--------+--------+ Distal     2.67   2.78      81        triphasic                 +-----------+-------+----------+----------+---------+--------+--------+ RT CIA Prox1.0    0.9       94        triphasic                 +-----------+-------+----------+----------+---------+--------+--------+ LT CIA Prox1.2    1.0       97        biphasic                  +-----------+-------+----------+----------+---------+--------+--------+ Visualization of the Proximal Abdominal Aorta was limited.  Summary: Abdominal Aorta: There is evidence of abnormal dilatation of the distal Abdominal aorta. The largest aortic measurement is 2.8 cm. The largest aortic diameter has decreased compared to prior exam. Previous diameter  measurement was 3.5 cm obtained on 11/15/21.  *See table(s) above for measurements and observations.  Electronically signed by Lemar Livings MD on 11/25/2022 at 4:42:49 PM.    Final     Assessment & Plan:  Iron deficiency anemia due to dietary causes -     CBC with Differential/Platelet; Future  Essential hypertension -     Basic metabolic  panel; Future -     CBC with Differential/Platelet; Future -     EKG 12-Lead  Other primary thrombophilia (HCC) -     Basic metabolic panel; Future -     CBC with Differential/Platelet; Future  Coronary artery disease involving native coronary artery of native heart without angina pectoris -     EKG 12-Lead     Follow-up: Return in about 6 months (around 12/09/2023).  Sanda Linger, MD

## 2023-06-10 NOTE — Patient Instructions (Signed)
Hypertension, Adult High blood pressure (hypertension) is when the force of blood pumping through the arteries is too strong. The arteries are the blood vessels that carry blood from the heart throughout the body. Hypertension forces the heart to work harder to pump blood and may cause arteries to become narrow or stiff. Untreated or uncontrolled hypertension can lead to a heart attack, heart failure, a stroke, kidney disease, and other problems. A blood pressure reading consists of a higher number over a lower number. Ideally, your blood pressure should be below 120/80. The first ("top") number is called the systolic pressure. It is a measure of the pressure in your arteries as your heart beats. The second ("bottom") number is called the diastolic pressure. It is a measure of the pressure in your arteries as the heart relaxes. What are the causes? The exact cause of this condition is not known. There are some conditions that result in high blood pressure. What increases the risk? Certain factors may make you more likely to develop high blood pressure. Some of these risk factors are under your control, including: Smoking. Not getting enough exercise or physical activity. Being overweight. Having too much fat, sugar, calories, or salt (sodium) in your diet. Drinking too much alcohol. Other risk factors include: Having a personal history of heart disease, diabetes, high cholesterol, or kidney disease. Stress. Having a family history of high blood pressure and high cholesterol. Having obstructive sleep apnea. Age. The risk increases with age. What are the signs or symptoms? High blood pressure may not cause symptoms. Very high blood pressure (hypertensive crisis) may cause: Headache. Fast or irregular heartbeats (palpitations). Shortness of breath. Nosebleed. Nausea and vomiting. Vision changes. Severe chest pain, dizziness, and seizures. How is this diagnosed? This condition is diagnosed by  measuring your blood pressure while you are seated, with your arm resting on a flat surface, your legs uncrossed, and your feet flat on the floor. The cuff of the blood pressure monitor will be placed directly against the skin of your upper arm at the level of your heart. Blood pressure should be measured at least twice using the same arm. Certain conditions can cause a difference in blood pressure between your right and left arms. If you have a high blood pressure reading during one visit or you have normal blood pressure with other risk factors, you may be asked to: Return on a different day to have your blood pressure checked again. Monitor your blood pressure at home for 1 week or longer. If you are diagnosed with hypertension, you may have other blood or imaging tests to help your health care provider understand your overall risk for other conditions. How is this treated? This condition is treated by making healthy lifestyle changes, such as eating healthy foods, exercising more, and reducing your alcohol intake. You may be referred for counseling on a healthy diet and physical activity. Your health care provider may prescribe medicine if lifestyle changes are not enough to get your blood pressure under control and if: Your systolic blood pressure is above 130. Your diastolic blood pressure is above 80. Your personal target blood pressure may vary depending on your medical conditions, your age, and other factors. Follow these instructions at home: Eating and drinking  Eat a diet that is high in fiber and potassium, and low in sodium, added sugar, and fat. An example of this eating plan is called the DASH diet. DASH stands for Dietary Approaches to Stop Hypertension. To eat this way: Eat   plenty of fresh fruits and vegetables. Try to fill one half of your plate at each meal with fruits and vegetables. Eat whole grains, such as whole-wheat pasta, brown rice, or whole-grain bread. Fill about one  fourth of your plate with whole grains. Eat or drink low-fat dairy products, such as skim milk or low-fat yogurt. Avoid fatty cuts of meat, processed or cured meats, and poultry with skin. Fill about one fourth of your plate with lean proteins, such as fish, chicken without skin, beans, eggs, or tofu. Avoid pre-made and processed foods. These tend to be higher in sodium, added sugar, and fat. Reduce your daily sodium intake. Many people with hypertension should eat less than 1,500 mg of sodium a day. Do not drink alcohol if: Your health care provider tells you not to drink. You are pregnant, may be pregnant, or are planning to become pregnant. If you drink alcohol: Limit how much you have to: 0-1 drink a day for women. 0-2 drinks a day for men. Know how much alcohol is in your drink. In the [place] one drink equals one 12 oz bottle of beer (355 mL), one 5 oz glass of wine (148 mL), or one 1 oz glass of hard liquor (44 mL). Lifestyle  Work with your health care provider to maintain a healthy body weight or to lose weight. Ask what an ideal weight is for you. Get at least 30 minutes of exercise that causes your heart to beat faster (aerobic exercise) most days of the week. Activities may include walking, swimming, or biking. Include exercise to strengthen your muscles (resistance exercise), such as Pilates or lifting weights, as part of your weekly exercise routine. Try to do these types of exercises for 30 minutes at least 3 days a week. Do not use any products that contain nicotine or tobacco. These products include cigarettes, chewing tobacco, and vaping devices, such as e-cigarettes. If you need help quitting, ask your health care provider. Monitor your blood pressure at home as told by your health care provider. Keep all follow-up visits. This is important. Medicines Take over-the-counter and prescription medicines only as told by your health care provider. Follow directions carefully. Blood  pressure medicines must be taken as prescribed. Do not skip doses of blood pressure medicine. Doing this puts you at risk for problems and can make the medicine less effective. Ask your health care provider about side effects or reactions to medicines that you should watch for. Contact a health care provider if you: Think you are having a reaction to a medicine you are taking. Have headaches that keep coming back (recurring). Feel dizzy. Have swelling in your ankles. Have trouble with your vision. Get help right away if you: Develop a severe headache or confusion. Have unusual weakness or numbness. Feel faint. Have severe pain in your chest or abdomen. Vomit repeatedly. Have trouble breathing. These symptoms may be an emergency. Get help right away. Call 911. Do not wait to see if the symptoms will go away. Do not drive yourself to the hospital. Summary Hypertension is when the force of blood pumping through your arteries is too strong. If this condition is not controlled, it may put you at risk for serious complications. Your personal target blood pressure may vary depending on your medical conditions, your age, and other factors. For most people, a normal blood pressure is less than 120/80. Hypertension is treated with lifestyle changes, medicines, or a combination of both. Lifestyle changes include losing weight, eating a healthy,   low-sodium diet, exercising more, and limiting alcohol. This information is not intended to replace advice given to you by your health care provider. Make sure you discuss any questions you have with your health care provider. Document Revised: 04/17/2021 Document Reviewed: 04/17/2021 Elsevier Patient Education  2024 Elsevier Inc.  

## 2023-06-15 ENCOUNTER — Other Ambulatory Visit: Payer: Self-pay | Admitting: Internal Medicine

## 2023-06-15 DIAGNOSIS — D508 Other iron deficiency anemias: Secondary | ICD-10-CM

## 2023-07-23 DIAGNOSIS — H9190 Unspecified hearing loss, unspecified ear: Secondary | ICD-10-CM | POA: Diagnosis not present

## 2023-07-23 DIAGNOSIS — K219 Gastro-esophageal reflux disease without esophagitis: Secondary | ICD-10-CM | POA: Diagnosis not present

## 2023-07-27 NOTE — Progress Notes (Signed)
 Cardiology Office Note    Date:  07/30/2023   ID:  Willie Black, DOB July 18, 1950, MRN [id number]  PCP:  Joshua Debby CROME, MD  Cardiologist: Dr. Chanley Mcenery   Chief Complaint  Patient presents with   Coronary Artery Disease    History of Present Illness:    Willie Black is a 73 y.o. male with past medical history of CAD (s/p CABG in 2011 with LIMA-LAD, SVG-OM, and SVG-PDA), HTN, HLD, and PVC's who presents to the office today for annual follow-up.  He had been intolerant to multiple statins in the past. Nuclear stress test in 2017 was low risk and his ejection fraction was normal.    In 2019 he developed an acute left lower extremity DVT and was placed on Xarelto  by his PCP.  His primary care provider has been following his lipids.  In January 2020 lipid profile showed his LDL to be 125.  He tried Crestor  20 mg but could not tolerate that dose secondary to myalgias and has cut it back to 10 mg.  The patient has a history of trigeminal neuralgia.  He ultimately underwent gamma knife surgery at [facility] March 2020 with some improvement.  The patient was  admitted with a spontaneous left pneumothorax 03/18/2019.  He underwent left VATS and bleb stapling 03/24/2019 by Dr. Lucas.  He was discharged on 03/27/2019. He was noted on carotid dopplers to have right subclavian stenosis. This was discussed with Dr Court who felt if the patient was asymptomatic then continue to follow medically. Abdominal US  in April 2021 showed stable AAA at 3.1 cm. Last US  in June 2024 showed size 2.8 cm  On follow up today he is doing very well. Still working full time. Active. No chest pain or dyspnea. No edema. Wife treated for ovarian CA this year. Son has ESRD and is awaiting transplant.   Past Medical History:  Diagnosis Date   Anxiety    CAD (coronary artery disease)    a. s/p CABG in 2011 with LIMA-LAD, SVG-OM, and SVG-PDA b. low-risk NST in 08/2015   GERD (gastroesophageal reflux disease)     Heart attack (HCC) 2011   Hypercholesteremia    Pneumothorax 03/18/2019   LEFT SIDE   PVC (premature ventricular contraction)    Bigeminy PVC's on event monitor Nov 2012   Renal calculi    Shingles    Trigeminal neuralgia of right side of face     Past Surgical History:  Procedure Laterality Date   CERVICAL DISC SURGERY     CORONARY ARTERY BYPASS GRAFT  05/28/2110   x3 with LIMA to LAD, SVG to OM and SVG to LAD after having ST elevation with coming off pump   KNEE ARTHROSCOPY     NECK SURGERY     STAPLING OF BLEBS Left 03/24/2019   Procedure: STAPLING OF BLEBS;  Surgeon: Lucas Dorise POUR, MD;  Location: [facility] OR;  Service: Thoracic;  Laterality: Left;   VIDEO ASSISTED THORACOSCOPY Left 03/24/2019   Procedure: VIDEO ASSISTED THORACOSCOPY;  Surgeon: Lucas Dorise POUR, MD;  Location: [facility] OR;  Service: Thoracic;  Laterality: Left;    Current Medications: Outpatient Medications Prior to Visit  Medication Sig Dispense Refill   Cholecalciferol (VITAMIN D3 PO) Take 1 tablet by mouth daily.     Cyanocobalamin  (VITAMIN B-12 PO) Take 1 capsule by mouth daily.     escitalopram  (LEXAPRO ) 20 MG tablet Take 20 mg by mouth daily.     esomeprazole (NEXIUM) 20 MG capsule  Take 20 mg by mouth daily at 12 noon.     ferrous sulfate  325 (65 FE) MG tablet TAKE 1 TABLET BY MOUTH TWICE A DAY WITH FOOD 180 tablet 0   metoprolol  succinate (TOPROL -XL) 25 MG 24 hr tablet TAKE 1 TABLET (25 MG TOTAL) BY MOUTH DAILY. 90 tablet 0   OXcarbazepine  (TRILEPTAL ) 150 MG tablet Take 75 mg by mouth daily.     Potassium Gluconate 550 MG TABS Take 1 tablet by mouth daily.     rivaroxaban  (XARELTO ) 10 MG TABS tablet Take 1 tablet (10 mg total) by mouth daily. 90 tablet 1   rosuvastatin  (CRESTOR ) 10 MG tablet Take 1 tablet (10 mg total) by mouth daily. 90 tablet 1   No facility-administered medications prior to visit.     Allergies:   Penicillins   Social History   Socioeconomic History   Marital status: Married    Spouse  name: Not on file   Number of children: 2   Years of education: 12   Highest education level: Not on file  Occupational History   Occupation: Investment Banker, Corporate: [employer]  Tobacco Use   Smoking status: Former    Current packs/day: 0.00    Types: Cigarettes    Quit date: 04/27/2009    Years since quitting: 14.2   Smokeless tobacco: Never  Vaping Use   Vaping status: Never Used  Substance and Sexual Activity   Alcohol use: No   Drug use: No   Sexual activity: Yes  Other Topics Concern   Not on file  Social History Narrative   Lives at home with his wife.   Right-handed.   2 cups caffeine daily.   Social Drivers of Corporate Investment Banker Strain: Low Risk  (12/07/2020)   Overall Financial Resource Strain (CARDIA)    Difficulty of Paying Living Expenses: Not very hard  Food Insecurity: No Food Insecurity (02/06/2022)   Hunger Vital Sign    Worried About Running Out of Food in the Last Year: Never true    Ran Out of Food in the Last Year: Never true  Transportation Needs: No Transportation Needs (02/06/2022)   PRAPARE - Administrator, Civil Service (Medical): No    Lack of Transportation (Non-Medical): No  Physical Activity: Not on file  Stress: Not on file  Social Connections: Not on file     Family History:  The patient's family history includes Aneurysm in his father; Coronary artery disease in his father; Heart failure in his mother.   Review of Systems:   Please see the history of present illness.    All other systems reviewed and are otherwise negative except as noted above.   Physical Exam:    VS:  BP 132/84   Pulse 62   Ht 6' 2 (1.88 m)   Wt 254 lb 12.8 oz (115.6 kg)   SpO2 96%   BMI 32.71 kg/m    BP 140/80 on left, 126/76 on right  General: normal Head: Normal Neck: No carotid bruits. JVD not elevated. Right subclavian bruit. Lungs: clear Heart: Regular rate and rhythm. No S3 or S4.  No murmur, no rubs, or gallops  appreciated. Abdomen: Soft, non-tender, non-distended with normoactive bowel sounds.  Msk:  Strength and tone appear normal for age. No joint deformities or effusions. Extremities: No clubbing or cyanosis. No lower extremity edema.  Distal pedal pulses are 2+ bilaterally. Neuro: Alert and oriented X 3. Moves all extremities spontaneously.  No focal deficits noted. Psych:   normal affect. Skin: No rashes or lesions noted  Wt Readings from Last 3 Encounters:  07/30/23 254 lb 12.8 oz (115.6 kg)  06/10/23 252 lb 3.2 oz (114.4 kg)  03/17/23 247 lb (112 kg)     Studies/Labs Reviewed:   EKG:  EKG is not ordered today.  The ekg ordered 06/10/23 demonstrates NSR with incomplete RBBB. No change. I have personally reviewed and interpreted this study.   Recent Labs: 12/05/2022: ALT 11; TSH 1.46 06/10/2023: BUN 16; Creatinine, Ser 1.02; Hemoglobin 13.0; Platelets 181.0; Potassium 4.1; Sodium 139   Lipid Panel    Component Value Date/Time   CHOL 139 12/05/2022 1628   CHOL 136 02/28/2022 0947   TRIG 128.0 12/05/2022 1628   HDL 44.30 12/05/2022 1628   HDL 47 02/28/2022 0947   CHOLHDL 3 12/05/2022 1628   VLDL 25.6 12/05/2022 1628   LDLCALC 69 12/05/2022 1628   LDLCALC 76 02/28/2022 0947   LDLDIRECT 74 02/28/2022 0947    Additional studies/ records that were reviewed today include:   NST: 09/06/2015 The left ventricular ejection fraction is normal (55-65%). Nuclear stress EF: 63%. Blood pressure demonstrated a hypertensive response to exercise. Upsloping ST segment depression ST segment depression was noted during stress in the II, III and aVF leads. The study is normal. This is a low risk study.  Carotid dopplers 05/23/19: Summary:  Right Carotid: Velocities in the right ICA are consistent with a 1-39%  stenosis.                 Non-hemodynamically significant plaque <50% noted in the  CCA.   Left Carotid: Velocities in the left ICA are consistent with a 1-39%  stenosis.    Vertebrals:  Left vertebral artery demonstrates antegrade flow. Abnormal  and               antegrade right vertebral artery waveform indicates a more  proximal               stenosis.  Subclavians: Right subclavian artery was stenotic. Normal flow  hemodynamics were               seen in the left subclavian artery. Early signs of right  subclavian               steal syndrome.   *See table(s) above for measurements and observations.  Suggest follow up study in If clinically indicated.    Electronically signed by Dorn Ross MD on 05/04/2019 at 11:41:30 AM.      Assessment:    1. Coronary artery disease involving native coronary artery of native heart without angina pectoris   2. Hx of CABG   3. Hyperlipidemia LDL goal <70   4. PVC's (premature ventricular contractions)       Plan:   In order of problems listed above:  1. CAD - s/p CABG in 2011 with LIMA-LAD, SVG-OM, and SVG-PDA. last NST in 08/2015 was low-risk.  - he is asymptomatic.  - continue Toprol  an statin.  - no ASA since on Xarelto .  2. HLD - tolerating Crestor  10 mg daily.  - last LDL 69.   3. HTN - BP is well controlled.   4. PVC's - no symptoms on Toprol    5. Right subclavian stenosis. Mild and asymptomatic.  6. AAA 2.8 cm by last US   Follow up in one year.     Signed, Dennard Vezina, MD  07/30/2023 8:18 AM  [facility] [address], [address] [place], [place] [address] Phone: [phone]

## 2023-07-30 ENCOUNTER — Encounter: Payer: Self-pay | Admitting: Cardiology

## 2023-07-30 ENCOUNTER — Other Ambulatory Visit: Payer: Self-pay | Admitting: Cardiology

## 2023-07-30 ENCOUNTER — Ambulatory Visit: Payer: Medicare Other | Attending: Cardiology | Admitting: Cardiology

## 2023-07-30 VITALS — BP 132/84 | HR 62 | Ht 74.0 in | Wt 254.8 lb

## 2023-07-30 DIAGNOSIS — I493 Ventricular premature depolarization: Secondary | ICD-10-CM

## 2023-07-30 DIAGNOSIS — Z951 Presence of aortocoronary bypass graft: Secondary | ICD-10-CM | POA: Diagnosis not present

## 2023-07-30 DIAGNOSIS — I2510 Atherosclerotic heart disease of native coronary artery without angina pectoris: Secondary | ICD-10-CM | POA: Diagnosis not present

## 2023-07-30 DIAGNOSIS — E785 Hyperlipidemia, unspecified: Secondary | ICD-10-CM | POA: Diagnosis not present

## 2023-07-30 NOTE — Patient Instructions (Signed)
 Medication Instructions:  Continue same medications *If you need a refill on your cardiac medications before your next appointment, please call your pharmacy*   Lab Work: None ordered   Testing/Procedures: None ordered   Follow-Up: At [facility], you and your health needs are our priority.  As part of our continuing mission to provide you with exceptional heart care, we have created designated Provider Care Teams.  These Care Teams include your primary Cardiologist (physician) and Advanced Practice Providers (APPs -  Physician Assistants and Nurse Practitioners) who all work together to provide you with the care you need, when you need it.  We recommend signing up for the patient portal called "MyChart".  Sign up information is provided on this After Visit Summary.  MyChart is used to connect with patients for Virtual Visits (Telemedicine).  Patients are able to view lab/test results, encounter notes, upcoming appointments, etc.  Non-urgent messages can be sent to your provider as well.   To learn more about what you can do with MyChart, go to [email or web address].    Your next appointment:  1 year    Call in Oct to schedule Feb appointment     Provider: Dr.Jordan

## 2023-08-08 DIAGNOSIS — K219 Gastro-esophageal reflux disease without esophagitis: Secondary | ICD-10-CM | POA: Diagnosis not present

## 2023-08-08 DIAGNOSIS — H9190 Unspecified hearing loss, unspecified ear: Secondary | ICD-10-CM | POA: Diagnosis not present

## 2023-08-16 ENCOUNTER — Other Ambulatory Visit: Payer: Self-pay | Admitting: Cardiology

## 2023-08-25 DIAGNOSIS — M47816 Spondylosis without myelopathy or radiculopathy, lumbar region: Secondary | ICD-10-CM | POA: Diagnosis not present

## 2023-08-25 DIAGNOSIS — G894 Chronic pain syndrome: Secondary | ICD-10-CM | POA: Diagnosis not present

## 2023-08-25 DIAGNOSIS — G43809 Other migraine, not intractable, without status migrainosus: Secondary | ICD-10-CM | POA: Diagnosis not present

## 2023-08-25 DIAGNOSIS — G500 Trigeminal neuralgia: Secondary | ICD-10-CM | POA: Diagnosis not present

## 2023-09-16 ENCOUNTER — Telehealth: Payer: Self-pay | Admitting: Internal Medicine

## 2023-09-18 DIAGNOSIS — H9190 Unspecified hearing loss, unspecified ear: Secondary | ICD-10-CM | POA: Diagnosis not present

## 2023-09-18 DIAGNOSIS — K219 Gastro-esophageal reflux disease without esophagitis: Secondary | ICD-10-CM | POA: Diagnosis not present

## 2023-09-20 ENCOUNTER — Other Ambulatory Visit: Payer: Self-pay | Admitting: Internal Medicine

## 2023-09-20 DIAGNOSIS — D508 Other iron deficiency anemias: Secondary | ICD-10-CM

## 2023-09-27 DIAGNOSIS — K219 Gastro-esophageal reflux disease without esophagitis: Secondary | ICD-10-CM | POA: Diagnosis not present

## 2023-09-27 DIAGNOSIS — H9190 Unspecified hearing loss, unspecified ear: Secondary | ICD-10-CM | POA: Diagnosis not present

## 2023-11-18 ENCOUNTER — Other Ambulatory Visit: Payer: Self-pay | Admitting: Internal Medicine

## 2023-11-18 DIAGNOSIS — I714 Abdominal aortic aneurysm, without rupture, unspecified: Secondary | ICD-10-CM

## 2023-11-21 DIAGNOSIS — H9190 Unspecified hearing loss, unspecified ear: Secondary | ICD-10-CM | POA: Diagnosis not present

## 2023-11-21 DIAGNOSIS — K219 Gastro-esophageal reflux disease without esophagitis: Secondary | ICD-10-CM | POA: Diagnosis not present

## 2023-12-08 ENCOUNTER — Ambulatory Visit: Payer: Self-pay | Admitting: Internal Medicine

## 2023-12-08 ENCOUNTER — Ambulatory Visit (HOSPITAL_COMMUNITY)
Admission: RE | Admit: 2023-12-08 | Discharge: 2023-12-08 | Disposition: A | Discharge status: Home / Self Care | Source: Ambulatory Visit | Attending: Internal Medicine | Admitting: Internal Medicine

## 2023-12-08 DIAGNOSIS — I714 Abdominal aortic aneurysm, without rupture, unspecified: Secondary | ICD-10-CM | POA: Diagnosis not present

## 2023-12-17 NOTE — Telephone Encounter (Signed)
 done

## 2023-12-21 DIAGNOSIS — H9190 Unspecified hearing loss, unspecified ear: Secondary | ICD-10-CM | POA: Diagnosis not present

## 2023-12-21 DIAGNOSIS — K219 Gastro-esophageal reflux disease without esophagitis: Secondary | ICD-10-CM | POA: Diagnosis not present

## 2024-01-03 ENCOUNTER — Other Ambulatory Visit: Payer: Self-pay | Admitting: Internal Medicine

## 2024-01-03 DIAGNOSIS — D6859 Other primary thrombophilia: Secondary | ICD-10-CM

## 2024-01-06 ENCOUNTER — Telehealth: Payer: Self-pay | Admitting: Internal Medicine

## 2024-01-06 NOTE — Telephone Encounter (Unsigned)
 Copied from CRM [id number]. Topic: Clinical - Medication Question >> [name] 06, 2024  9:40 AM Gennette ORN wrote: Reason for CRM: Patient wants to know is he still suppose to be taking rivaroxaban  (XARELTO ) 10 MG TABS tablet he had an issue trying to get his medication.

## 2024-01-08 ENCOUNTER — Other Ambulatory Visit: Payer: Self-pay | Admitting: Internal Medicine

## 2024-01-08 DIAGNOSIS — D6859 Other primary thrombophilia: Secondary | ICD-10-CM

## 2024-01-08 NOTE — Telephone Encounter (Signed)
 Unable to reach patient. lMTRC

## 2024-01-12 NOTE — Telephone Encounter (Signed)
 Copied from CRM [id number]. Topic: Clinical - Prescription Issue >> [name] 12, 2024  1:16 PM Willie Black wrote: Reason for CRM: Patient called in regarding rivaroxaban  (XARELTO ) 10 MG TABS tablet Stated he still hasn't been able to get the prescription from the pharmacy because they stated it has not been sent in yet

## 2024-01-15 ENCOUNTER — Other Ambulatory Visit: Payer: Self-pay | Admitting: Internal Medicine

## 2024-01-15 DIAGNOSIS — I2510 Atherosclerotic heart disease of native coronary artery without angina pectoris: Secondary | ICD-10-CM

## 2024-01-15 DIAGNOSIS — E785 Hyperlipidemia, unspecified: Secondary | ICD-10-CM

## 2024-01-16 NOTE — Telephone Encounter (Signed)
 Patient has his medication and started taking it.

## 2024-01-22 DIAGNOSIS — K219 Gastro-esophageal reflux disease without esophagitis: Secondary | ICD-10-CM | POA: Diagnosis not present

## 2024-01-22 DIAGNOSIS — H9190 Unspecified hearing loss, unspecified ear: Secondary | ICD-10-CM | POA: Diagnosis not present

## 2024-02-09 ENCOUNTER — Encounter: Payer: Self-pay | Admitting: Internal Medicine

## 2024-02-09 ENCOUNTER — Ambulatory Visit (INDEPENDENT_AMBULATORY_CARE_PROVIDER_SITE_OTHER): Admitting: Internal Medicine

## 2024-02-09 ENCOUNTER — Ambulatory Visit: Payer: Self-pay | Admitting: Internal Medicine

## 2024-02-09 VITALS — BP 130/82 | HR 60 | Temp 98.5°F | Ht 74.0 in | Wt 243.2 lb

## 2024-02-09 DIAGNOSIS — D6859 Other primary thrombophilia: Secondary | ICD-10-CM

## 2024-02-09 DIAGNOSIS — Z0001 Encounter for general adult medical examination with abnormal findings: Secondary | ICD-10-CM

## 2024-02-09 DIAGNOSIS — K219 Gastro-esophageal reflux disease without esophagitis: Secondary | ICD-10-CM | POA: Diagnosis not present

## 2024-02-09 DIAGNOSIS — N400 Enlarged prostate without lower urinary tract symptoms: Secondary | ICD-10-CM

## 2024-02-09 DIAGNOSIS — Z0000 Encounter for general adult medical examination without abnormal findings: Secondary | ICD-10-CM

## 2024-02-09 DIAGNOSIS — E785 Hyperlipidemia, unspecified: Secondary | ICD-10-CM

## 2024-02-09 DIAGNOSIS — I714 Abdominal aortic aneurysm, without rupture, unspecified: Secondary | ICD-10-CM | POA: Diagnosis not present

## 2024-02-09 DIAGNOSIS — I2510 Atherosclerotic heart disease of native coronary artery without angina pectoris: Secondary | ICD-10-CM | POA: Diagnosis not present

## 2024-02-09 DIAGNOSIS — I10 Essential (primary) hypertension: Secondary | ICD-10-CM

## 2024-02-09 DIAGNOSIS — Z23 Encounter for immunization: Secondary | ICD-10-CM | POA: Insufficient documentation

## 2024-02-09 LAB — URINALYSIS, ROUTINE W REFLEX MICROSCOPIC
Bilirubin Urine: NEGATIVE
Hgb urine dipstick: NEGATIVE
Ketones, ur: NEGATIVE
Leukocytes,Ua: NEGATIVE
Nitrite: NEGATIVE
RBC / HPF: NONE SEEN (ref 0–?)
Specific Gravity, Urine: 1.025 (ref 1.000–1.030)
Total Protein, Urine: NEGATIVE
Urine Glucose: NEGATIVE
Urobilinogen, UA: 0.2 (ref 0.0–1.0)
WBC, UA: NONE SEEN (ref 0–?)
pH: 6 (ref 5.0–8.0)

## 2024-02-09 LAB — BASIC METABOLIC PANEL WITH GFR
BUN: 15 mg/dL (ref 6–23)
CO2: 30 meq/L (ref 19–32)
Calcium: 8.7 mg/dL (ref 8.4–10.5)
Chloride: 100 meq/L (ref 96–112)
Creatinine, Ser: 0.96 mg/dL (ref 0.40–1.50)
GFR: 79.21 mL/min (ref >60.00)
Glucose, Bld: 64 mg/dL — ABNORMAL LOW (ref 70–99)
Potassium: 4 meq/L (ref 3.5–5.1)
Sodium: 138 meq/L (ref 135–145)

## 2024-02-09 LAB — LIPID PANEL
Cholesterol: 128 mg/dL (ref 0–200)
HDL: 46 mg/dL (ref >39.00)
LDL Cholesterol: 68 mg/dL (ref 0–99)
NonHDL: 82.26
Total CHOL/HDL Ratio: 3
Triglycerides: 69 mg/dL (ref 0.0–149.0)
VLDL: 13.8 mg/dL (ref 0.0–40.0)

## 2024-02-09 LAB — CBC WITH DIFFERENTIAL/PLATELET
Basophils Absolute: 0 K/uL (ref 0.0–0.1)
Basophils Relative: 1 % (ref 0.0–3.0)
Eosinophils Absolute: 0.1 K/uL (ref 0.0–0.7)
Eosinophils Relative: 2.2 % (ref 0.0–5.0)
HCT: 39.4 % (ref 39.0–52.0)
Hemoglobin: 12.9 g/dL — ABNORMAL LOW (ref 13.0–17.0)
Lymphocytes Relative: 20.9 % (ref 12.0–46.0)
Lymphs Abs: 0.9 K/uL (ref 0.7–4.0)
MCHC: 32.7 g/dL (ref 30.0–36.0)
MCV: 94.1 fl (ref 78.0–100.0)
Monocytes Absolute: 0.6 K/uL (ref 0.1–1.0)
Monocytes Relative: 14.4 % — ABNORMAL HIGH (ref 3.0–12.0)
Neutro Abs: 2.7 K/uL (ref 1.4–7.7)
Neutrophils Relative %: 61.5 % (ref 43.0–77.0)
Platelets: 183 K/uL (ref 150.0–400.0)
RBC: 4.19 Mil/uL — ABNORMAL LOW (ref 4.22–5.81)
RDW: 14.4 % (ref 11.5–15.5)
WBC: 4.4 K/uL (ref 4.0–10.5)

## 2024-02-09 LAB — HEPATIC FUNCTION PANEL
ALT: 11 U/L (ref 0–53)
AST: 16 U/L (ref 0–37)
Albumin: 3.9 g/dL (ref 3.5–5.2)
Alkaline Phosphatase: 100 U/L (ref 39–117)
Bilirubin, Direct: 0.1 mg/dL (ref 0.0–0.3)
Total Bilirubin: 0.6 mg/dL (ref 0.2–1.2)
Total Protein: 6.5 g/dL (ref 6.0–8.3)

## 2024-02-09 LAB — TSH: TSH: 1.43 u[IU]/mL (ref 0.35–5.50)

## 2024-02-09 LAB — PSA: PSA: 1.19 ng/mL (ref 0.10–4.00)

## 2024-02-09 MED ORDER — BOOSTRIX 5-2.5-18.5 LF-MCG/0.5 IM SUSP: 0.5000 mL | Freq: Once | INTRAMUSCULAR | 0 refills | Status: AC

## 2024-02-09 NOTE — Progress Notes (Signed)
 "  Subjective:  Patient ID: Willie Black, male    DOB: 02-03-1951  Age: 73 y.o. MRN: [id number]  CC: Annual Exam   HPI KEYONDRE HEPBURN presents for a CPX and f/up ----  Discussed the use of AI scribe software for clinical note transcription with the patient, who gave verbal consent to proceed.  History of Present Illness Willie Black is a 73 year old male who presents for a routine follow-up visit.  He remains active and continues to work without experiencing chest pain, shortness of breath, dizziness, lightheadedness, or palpitations during physical activity. No trouble or pain with swallowing.  He denies any side effects from his medications, which include rosuvastatin , metoprolol , Lexapro , and Nexium. He notes some bruising on his arm, which he attributes to hitting it while taking Xarelto . He takes Nexium for heartburn or indigestion.     Outpatient Medications Prior to Visit  Medication Sig Dispense Refill   Cholecalciferol (VITAMIN D3 PO) Take 1 tablet by mouth daily.     Cyanocobalamin  (VITAMIN B-12 PO) Take 1 capsule by mouth daily.     escitalopram  (LEXAPRO ) 20 MG tablet Take 20 mg by mouth daily.     esomeprazole (NEXIUM) 20 MG capsule Take 20 mg by mouth daily at 12 noon.     ferrous sulfate  325 (65 FE) MG tablet TAKE 1 TABLET BY MOUTH TWICE A DAY WITH FOOD 180 tablet 0   metoprolol  succinate (TOPROL -XL) 25 MG 24 hr tablet TAKE 1 TABLET (25 MG TOTAL) BY MOUTH DAILY. 90 tablet 3   OXcarbazepine  (TRILEPTAL ) 150 MG tablet Take 75 mg by mouth daily.     Potassium Gluconate 550 MG TABS Take 1 tablet by mouth daily.     rivaroxaban  (XARELTO ) 10 MG TABS tablet TAKE 1 TABLET BY MOUTH EVERY DAY 90 tablet 0   rosuvastatin  (CRESTOR ) 10 MG tablet TAKE 1 TABLET BY MOUTH EVERY DAY 90 tablet 1   No facility-administered medications prior to visit.    ROS Review of Systems  Constitutional:  Negative for appetite change, chills, diaphoresis, fatigue and fever.   HENT: Negative.    Eyes: Negative.   Respiratory: Negative.  Negative for cough, chest tightness, shortness of breath and wheezing.   Cardiovascular:  Negative for chest pain, palpitations and leg swelling.  Gastrointestinal: Negative.  Negative for abdominal pain, constipation, diarrhea, nausea and vomiting.  Endocrine: Negative.   Genitourinary: Negative.  Negative for difficulty urinating.  Musculoskeletal: Negative.  Negative for arthralgias.  Skin: Negative.  Negative for color change.  Neurological: Negative.  Negative for dizziness and weakness.  Hematological:  Negative for adenopathy. Does not bruise/bleed easily.  Psychiatric/Behavioral: Negative.      Objective:  BP 130/82 (BP Location: Left Arm, Patient Position: Sitting)   Pulse 60   Temp 98.5 F (36.9 C) (Oral)   Ht 6' 2 (1.88 m)   Wt 243 lb 3.2 oz (110.3 kg)   SpO2 95%   BMI 31.23 kg/m   BP Readings from Last 3 Encounters:  02/09/24 130/82  07/30/23 132/84  06/10/23 138/72    Wt Readings from Last 3 Encounters:  02/09/24 243 lb 3.2 oz (110.3 kg)  07/30/23 254 lb 12.8 oz (115.6 kg)  06/10/23 252 lb 3.2 oz (114.4 kg)    Physical Exam Vitals reviewed.  Constitutional:      Appearance: Normal appearance.  HENT:     Nose: Nose normal.     Mouth/Throat:     Mouth: Mucous membranes are moist.  Eyes:     General: No scleral icterus.    Conjunctiva/sclera: Conjunctivae normal.  Cardiovascular:     Rate and Rhythm: Regular rhythm. Bradycardia present.     Heart sounds: Normal heart sounds, S1 normal and S2 normal. No murmur heard.    No friction rub. No gallop.     Comments: EKG- SB, 56 bpm No LVH, Q waves, or ST/T wave changes  Pulmonary:     Effort: Pulmonary effort is normal.     Breath sounds: No stridor. No wheezing, rhonchi or rales.  Abdominal:     General: Abdomen is flat.     Palpations: There is no mass.     Tenderness: There is no abdominal tenderness. There is no guarding.     Hernia:  No hernia is present. There is no hernia in the left inguinal area or right inguinal area.  Genitourinary:    Pubic Area: No rash.      Penis: Circumcised.      Testes: Normal.     Epididymis:     Right: Normal.     Left: Normal.     Comments: DRE deferred at his request Musculoskeletal:        General: Normal range of motion.     Cervical back: Neck supple.     Right lower leg: No edema.     Left lower leg: No edema.  Lymphadenopathy:     Cervical: No cervical adenopathy.     Lower Body: No right inguinal adenopathy. No left inguinal adenopathy.  Skin:    General: Skin is warm and dry.     Coloration: Skin is not pale.     Findings: No rash.  Neurological:     General: No focal deficit present.     Mental Status: He is alert.  Psychiatric:        Mood and Affect: Mood normal.        Behavior: Behavior normal.     Lab Results  Component Value Date   WBC 4.4 02/09/2024   HGB 12.9 (L) 02/09/2024   HCT 39.4 02/09/2024   PLT 183.0 02/09/2024   GLUCOSE 64 (L) 02/09/2024   CHOL 128 02/09/2024   TRIG 69.0 02/09/2024   HDL 46.00 02/09/2024   LDLDIRECT 74 02/28/2022   LDLCALC 68 02/09/2024   ALT 11 02/09/2024   AST 16 02/09/2024   NA 138 02/09/2024   K 4.0 02/09/2024   CL 100 02/09/2024   CREATININE 0.96 02/09/2024   BUN 15 02/09/2024   CO2 30 02/09/2024   TSH 1.43 02/09/2024   PSA 1.19 02/09/2024   INR 0.97 11/22/2010    VAS US  AAA DUPLEX Result Date: 12/08/2023 ABDOMINAL AORTA STUDY Patient Name:  Willie Black  Date of Exam:   12/08/2023 Medical Rec #: [id number]             Accession #:    [id number] Date of Birth: August 22, 1950             Patient Gender: M Patient Age:   54 years Exam Location:  [address] Procedure:      VAS US  AAA DUPLEX Referring Phys: DEBBY MOLT --------------------------------------------------------------------------------  Indications: Follow up exam for known AAA. Patient denies any abdominal or lower              back pain. Risk  Factors: Hypertension, hyperlipidemia, past history of smoking. Limitations: Air/bowel gas.  Comparison Study: Previous AAA duplex 11/24/22 showed distal aorta dimension of  2.8 cm. AAA duplex 11/15/21 showed distal aorta dimension of                   3.0 x 3.5 cm. Performing Technologist: Edsel Mustard RVT  Examination Guidelines: A complete evaluation includes B-mode imaging, spectral Doppler, color Doppler, and power Doppler as needed of all accessible portions of each vessel. Bilateral testing is considered an integral part of a complete examination. Limited examinations for reoccurring indications may be performed as noted.  Abdominal Aorta Findings: +-----------+-------+----------+----------+---------+--------+--------+ Location   AP (cm)Trans (cm)PSV (cm/s)Waveform ThrombusComments +-----------+-------+----------+----------+---------+--------+--------+ Proximal   1.90   1.70      100       triphasic                 +-----------+-------+----------+----------+---------+--------+--------+ Mid        2.10   2.00      91        biphasic                  +-----------+-------+----------+----------+---------+--------+--------+ Distal     3.10   3.30      76        biphasic         fusiform +-----------+-------+----------+----------+---------+--------+--------+ RT CIA Prox1.2    1.2       107       biphasic                  +-----------+-------+----------+----------+---------+--------+--------+ LT CIA Prox1.2    1.2       107       biphasic                  +-----------+-------+----------+----------+---------+--------+--------+ IVC/Iliac Findings: +--------+------+--------+-------------------------------+   IVC   PatentThrombus           Comments             +--------+------+--------+-------------------------------+ IVC Prox              not visualized due to bowel gas +--------+------+--------+-------------------------------+    Summary:  Abdominal Aorta: There is evidence of abnormal dilatation of the distal Abdominal aorta. The largest aortic measurement is 3.3 cm. The largest aortic diameter has increased compared to prior exam. Previous diameter measurement was 2.8 cm obtained on 11/25/22. Diameter measurement on 11/15/21 was 3.5 cm.  *See table(s) above for measurements and observations.  Electronically signed by Gaile New MD on 12/08/2023 at 11:53:18 AM.    Final     Assessment & Plan:  Gastroesophageal reflux disease without esophagitis -     CBC with Differential/Platelet; Future  Hyperlipidemia LDL goal <70- LDL goal achieved. Doing well on the statin  -     Lipid panel; Future -     Hepatic function panel; Future  Essential hypertension -     TSH; Future -     Urinalysis, Routine w reflex microscopic; Future  Benign prostatic hyperplasia without lower urinary tract symptoms -     PSA; Future -     Urinalysis, Routine w reflex microscopic; Future  Other primary thrombophilia (HCC) -     Basic metabolic panel with GFR; Future -     CBC with Differential/Platelet; Future  Hypertension, unspecified type- BP is well controlled. -     Urinalysis, Routine w reflex microscopic; Future -     Hepatic function panel; Future -     Basic metabolic panel with GFR; Future -     CBC with Differential/Platelet; Future  Encounter for general adult medical examination  with abnormal findings- Exam completed, labs reviewed, vaccines reviewed and updated, cancer screenings are UTD, pt ed material was given.   Coronary artery disease involving native coronary artery of native heart without angina pectoris -     EKG 12-Lead  Abdominal aortic aneurysm (AAA) without rupture, unspecified part (HCC)- Recent U/S noted.  Need for prophylactic vaccination with combined diphtheria-tetanus-pertussis (DTP) vaccine -     Boostrix ; Inject 0.5 mLs into the muscle once for 1 dose.  Dispense: 0.5 mL; Refill: 0     Follow-up: Return in  about 6 months (around 08/11/2024).  Debby Molt, MD "

## 2024-02-09 NOTE — Patient Instructions (Signed)
 Health Maintenance, Male  Adopting a healthy lifestyle and getting preventive care are important in promoting health and wellness. Ask your health care provider about:  The right schedule for you to have regular tests and exams.  Things you can do on your own to prevent diseases and keep yourself healthy.  What should I know about diet, weight, and exercise?  Eat a healthy diet    Eat a diet that includes plenty of vegetables, fruits, low-fat dairy products, and lean protein.  Do not eat a lot of foods that are high in solid fats, added sugars, or sodium.  Maintain a healthy weight  Body mass index (BMI) is a measurement that can be used to identify possible weight problems. It estimates body fat based on height and weight. Your health care provider can help determine your BMI and help you achieve or maintain a healthy weight.  Get regular exercise  Get regular exercise. This is one of the most important things you can do for your health. Most adults should:  Exercise for at least 150 minutes each week. The exercise should increase your heart rate and make you sweat (moderate-intensity exercise).  Do strengthening exercises at least twice a week. This is in addition to the moderate-intensity exercise.  Spend less time sitting. Even light physical activity can be beneficial.  Watch cholesterol and blood lipids  Have your blood tested for lipids and cholesterol at 72 years of age, then have this test every 5 years.  You may need to have your cholesterol levels checked more often if:  Your lipid or cholesterol levels are high.  You are older than 72 years of age.  You are at high risk for heart disease.  What should I know about cancer screening?  Many types of cancers can be detected early and may often be prevented. Depending on your health history and family history, you may need to have cancer screening at various ages. This may include screening for:  Colorectal cancer.  Prostate cancer.  Skin cancer.  Lung  cancer.  What should I know about heart disease, diabetes, and high blood pressure?  Blood pressure and heart disease  High blood pressure causes heart disease and increases the risk of stroke. This is more likely to develop in people who have high blood pressure readings or are overweight.  Talk with your health care provider about your target blood pressure readings.  Have your blood pressure checked:  Every 3-5 years if you are 24-52 years of age.  Every year if you are 3 years old or older.  If you are between the ages of 60 and 72 and are a current or former smoker, ask your health care provider if you should have a one-time screening for abdominal aortic aneurysm (AAA).  Diabetes  Have regular diabetes screenings. This checks your fasting blood sugar level. Have the screening done:  Once every three years after age 66 if you are at a normal weight and have a low risk for diabetes.  More often and at a younger age if you are overweight or have a high risk for diabetes.  What should I know about preventing infection?  Hepatitis B  If you have a higher risk for hepatitis B, you should be screened for this virus. Talk with your health care provider to find out if you are at risk for hepatitis B infection.  Hepatitis C  Blood testing is recommended for:  Everyone born from [birth year] through 1964.  Anyone  with known risk factors for hepatitis C.  Sexually transmitted infections (STIs)  You should be screened each year for STIs, including gonorrhea and chlamydia, if:  You are sexually active and are younger than 72 years of age.  You are older than 72 years of age and your health care provider tells you that you are at risk for this type of infection.  Your sexual activity has changed since you were last screened, and you are at increased risk for chlamydia or gonorrhea. Ask your health care provider if you are at risk.  Ask your health care provider about whether you are at high risk for HIV. Your health care provider  may recommend a prescription medicine to help prevent HIV infection. If you choose to take medicine to prevent HIV, you should first get tested for HIV. You should then be tested every 3 months for as long as you are taking the medicine.  Follow these instructions at home:  Alcohol use  Do not drink alcohol if your health care provider tells you not to drink.  If you drink alcohol:  Limit how much you have to 0-2 drinks a day.  Know how much alcohol is in your drink. In the [place] one drink equals one 12 oz bottle of beer (355 mL), one 5 oz glass of wine (148 mL), or one 1 oz glass of hard liquor (44 mL).  Lifestyle  Do not use any products that contain nicotine or tobacco. These products include cigarettes, chewing tobacco, and vaping devices, such as e-cigarettes. If you need help quitting, ask your health care provider.  Do not use street drugs.  Do not share needles.  Ask your health care provider for help if you need support or information about quitting drugs.  General instructions  Schedule regular health, dental, and eye exams.  Stay current with your vaccines.  Tell your health care provider if:  You often feel depressed.  You have ever been abused or do not feel safe at home.  Summary  Adopting a healthy lifestyle and getting preventive care are important in promoting health and wellness.  Follow your health care provider's instructions about healthy diet, exercising, and getting tested or screened for diseases.  Follow your health care provider's instructions on monitoring your cholesterol and blood pressure.  This information is not intended to replace advice given to you by your health care provider. Make sure you discuss any questions you have with your health care provider.  Document Revised: 10/30/2020 Document Reviewed: 10/30/2020  Elsevier Patient Education  2024 [employer].

## 2024-02-19 DIAGNOSIS — G894 Chronic pain syndrome: Secondary | ICD-10-CM | POA: Diagnosis not present

## 2024-02-19 DIAGNOSIS — G43809 Other migraine, not intractable, without status migrainosus: Secondary | ICD-10-CM | POA: Diagnosis not present

## 2024-02-19 DIAGNOSIS — M47816 Spondylosis without myelopathy or radiculopathy, lumbar region: Secondary | ICD-10-CM | POA: Diagnosis not present

## 2024-02-19 DIAGNOSIS — G500 Trigeminal neuralgia: Secondary | ICD-10-CM | POA: Diagnosis not present

## 2024-03-01 ENCOUNTER — Other Ambulatory Visit: Payer: Self-pay | Admitting: Internal Medicine

## 2024-03-01 DIAGNOSIS — D6859 Other primary thrombophilia: Secondary | ICD-10-CM

## 2024-06-02 ENCOUNTER — Other Ambulatory Visit: Payer: Self-pay | Admitting: Internal Medicine

## 2024-06-02 DIAGNOSIS — D508 Other iron deficiency anemias: Secondary | ICD-10-CM

## 2024-07-04 ENCOUNTER — Other Ambulatory Visit: Payer: Self-pay | Admitting: Internal Medicine

## 2024-07-04 DIAGNOSIS — D6859 Other primary thrombophilia: Secondary | ICD-10-CM

## 2024-07-15 ENCOUNTER — Encounter: Payer: Self-pay | Admitting: Emergency Medicine

## 2024-07-15 ENCOUNTER — Ambulatory Visit
Admission: EM | Admit: 2024-07-15 | Discharge: 2024-07-15 | Disposition: A | Discharge status: Home / Self Care | Attending: Family Medicine | Admitting: Family Medicine

## 2024-07-15 ENCOUNTER — Ambulatory Visit: Payer: Self-pay

## 2024-07-15 DIAGNOSIS — M549 Dorsalgia, unspecified: Secondary | ICD-10-CM | POA: Diagnosis not present

## 2024-07-15 DIAGNOSIS — R10A2 Flank pain, left side: Secondary | ICD-10-CM | POA: Diagnosis not present

## 2024-07-15 LAB — POCT URINE DIPSTICK
Bilirubin, UA: NEGATIVE
Blood, UA: NEGATIVE
Glucose, UA: NEGATIVE mg/dL
Ketones, POC UA: NEGATIVE mg/dL
Leukocytes, UA: NEGATIVE
Nitrite, UA: NEGATIVE
POC PROTEIN,UA: 30 — AB
Spec Grav, UA: 1.025
Urobilinogen, UA: 0.2 U/dL
pH, UA: 5.5

## 2024-07-15 NOTE — Telephone Encounter (Signed)
 FYI Only or Action Required?: FYI only for provider: Pt going to UC.  Patient was last seen in primary care on 02/09/2024 by Joshua Debby CROME, MD.  Called Nurse Triage reporting Flank Pain.  Symptoms began several days ago.  Interventions attempted: Nothing.  Symptoms are: unchanged.  Triage Disposition: See Physician Within 24 Hours  Patient/caregiver understands and will follow disposition?: Yes   Reason for Disposition  MODERATE pain (e.g., interferes with normal activities or awakens from sleep)  Answer Assessment - Initial Assessment Questions Pt has hx of kidney stones on right side. Pt reports last time being in excruciating pain, barely able to urinate but he is not experiencing that currently, so he is unsure if its a kidney stone, or he injured himself at work. Pt stated he called an UC to see if he can get an appointment but had to leave a message. Offered a 1:40 visit at the office or if patient wanted to go to UC to be seen this morning he can do that as well. Given [facility] information, patient stated he will go there.   1. LOCATION: Where does it hurt? (e.g., left, right)     Left side  2. ONSET: When did the pain start?     3 days ago   3. SEVERITY: How bad is the pain? (e.g., Scale 1-10; mild, moderate, or severe)     2-3/10 when sitting, 10/10 when walking or moving   4. PATTERN: Does the pain come and go, or is it constant?      Constant pain, worse with movement, like standing or twisting  5. CAUSE: What do you think is causing the pain?     Unsure if he hurt his back at work or its a kidney stone  6. OTHER SYMPTOMS:  Do you have any other symptoms? (e.g., fever, abdomen pain, vomiting, leg weakness, burning with urination, blood in urine)   Denies  Protocols used: Flank Pain-A-AH  Message from Alexandria E sent at 07/15/2024  8:36 AM EST  Summary: Left side back pain   Reason for Triage: Potential kidney stone or back  pain. Patient stated that he is having left side back pain that radiates to his side. Pain has been going 3 days, getting worse.

## 2024-07-15 NOTE — ED Provider Notes (Signed)
 " [facility]    CSN: [id number] Arrival date & time: 07/15/24  1008      History   Chief Complaint Chief Complaint  Patient presents with   Back Pain   Flank Pain    HPI Willie Black is a 74 y.o. male presents for flank pain.  Patient reports 3 days of a persistent waxing and waning left flank pain that does not radiate.  He states certain positions does improve it but does not make it go away.  Feels like a pressure.  He denies any dysuria, hematuria, nausea/vomiting, fevers, abd pain.  Denies any injury or known inciting event.  States has a history of kidney stones on the right and this feels slightly similar but has never had on the left side.  He denies any numbness tingling weakness of his lower extremities, bowel or bladder incontinence, saddle paresthesia.  He has been taking tramadol  with improvement in pain.  No other concerns.   Back Pain Flank Pain    Past Medical History:  Diagnosis Date   Anxiety    CAD (coronary artery disease)    a. s/p CABG in 2011 with LIMA-LAD, SVG-OM, and SVG-PDA b. low-risk NST in 08/2015   GERD (gastroesophageal reflux disease)    Heart attack (HCC) 2011   Hypercholesteremia    Pneumothorax 03/18/2019   LEFT SIDE   PVC (premature ventricular contraction)    Bigeminy PVC's on event monitor Nov 2012   Renal calculi    Shingles    Trigeminal neuralgia of right side of face     Patient Active Problem List   Diagnosis Date Noted   Need for prophylactic vaccination with combined diphtheria-tetanus-pertussis (DTP) vaccine 02/09/2024   Encounter for general adult medical examination with abnormal findings 12/05/2022   Hypertension 12/03/2021   Erectile dysfunction due to arterial disease 05/12/2020   Coronary artery disease involving native coronary artery of native heart without angina pectoris 05/11/2020   Other primary thrombophilia 05/10/2020   Sensorineural hearing loss (SNHL) of both ears 05/10/2020   Flu  vaccine need 05/10/2020   Insomnia due to medical condition 04/01/2019   Abnormal brain MRI 07/26/2018   Benign prostatic hyperplasia without lower urinary tract symptoms 07/15/2018   Hypercoagulable state 02/25/2018   Trigeminal neuralgia of right side of face 05/15/2016   PVC's (premature ventricular contractions) 04/19/2014   Abdominal aortic aneurysm 09/30/2013   Essential hypertension 05/15/2011   GERD (gastroesophageal reflux disease)    Hyperlipidemia LDL goal <70    PULMONARY NODULE 04/12/2010    Past Surgical History:  Procedure Laterality Date   CERVICAL DISC SURGERY     CORONARY ARTERY BYPASS GRAFT  05/28/2110   x3 with LIMA to LAD, SVG to OM and SVG to LAD after having ST elevation with coming off pump   KNEE ARTHROSCOPY     NECK SURGERY     STAPLING OF BLEBS Left 03/24/2019   Procedure: STAPLING OF BLEBS;  Surgeon: Lucas Dorise POUR, MD;  Location: [facility] OR;  Service: Thoracic;  Laterality: Left;   VIDEO ASSISTED THORACOSCOPY Left 03/24/2019   Procedure: VIDEO ASSISTED THORACOSCOPY;  Surgeon: Lucas Dorise POUR, MD;  Location: [facility] OR;  Service: Thoracic;  Laterality: Left;       Home Medications    Prior to Admission medications  Medication Sig Start Date End Date Taking? Authorizing Provider  Cholecalciferol (VITAMIN D3 PO) Take 1 tablet by mouth daily.   Yes [provider]  Cyanocobalamin  (VITAMIN B-12 PO) Take  1 capsule by mouth daily.   Yes [provider]  escitalopram  (LEXAPRO ) 20 MG tablet Take 20 mg by mouth daily. 01/28/23  Yes [provider]  esomeprazole (NEXIUM) 20 MG capsule Take 20 mg by mouth daily at 12 noon.   Yes [provider]  ferrous sulfate  325 (65 FE) MG tablet TAKE 1 TABLET BY MOUTH TWICE A DAY WITH FOOD 06/02/24  Yes Joshua Debby CROME, MD  metoprolol  succinate (TOPROL -XL) 25 MG 24 hr tablet TAKE 1 TABLET (25 MG TOTAL) BY MOUTH DAILY. 08/18/23  Yes Jordan, Peter M, MD  OXcarbazepine  (TRILEPTAL ) 150 MG tablet Take 75 mg  by mouth daily.   Yes [provider]  Potassium Gluconate 550 MG TABS Take 1 tablet by mouth daily.   Yes [provider]  rosuvastatin  (CRESTOR ) 10 MG tablet TAKE 1 TABLET BY MOUTH EVERY DAY 01/16/24  Yes Joshua Debby CROME, MD  XARELTO  10 MG TABS tablet TAKE 1 TABLET BY MOUTH EVERY DAY 07/07/24  Yes Joshua Debby CROME, MD    Family History Family History  Problem Relation Age of Onset   Heart failure Mother    Aneurysm Father        abdominal   Coronary artery disease Father        3 stents   Colon cancer Neg Hx    Rectal cancer Neg Hx    Stomach cancer Neg Hx    Esophageal cancer Neg Hx    Pancreatic cancer Neg Hx    Liver cancer Neg Hx     Social History Social History[1]   Allergies   Penicillins   Review of Systems Review of Systems  Genitourinary:  Positive for flank pain.  Musculoskeletal:  Positive for back pain.     Physical Exam Triage Vital Signs ED Triage Vitals [07/15/24 1118]  Encounter Vitals Group     BP 129/75     Girls Systolic BP Percentile      Girls Diastolic BP Percentile      Boys Systolic BP Percentile      Boys Diastolic BP Percentile      Pulse Rate 61     Resp 16     Temp 98 F (36.7 C)     Temp Source Oral     SpO2 95 %     Weight      Height      Head Circumference      Peak Flow      Pain Score 5     Pain Loc      Pain Education      Exclude from Growth Chart    No data found.  Updated Vital Signs BP 129/75 (BP Location: Left Arm)   Pulse 61   Temp 98 F (36.7 C) (Oral)   Resp 16   SpO2 95%   Visual Acuity Right Eye Distance:   Left Eye Distance:   Bilateral Distance:    Right Eye Near:   Left Eye Near:    Bilateral Near:     Physical Exam Vitals and nursing note reviewed.  Constitutional:      General: He is not in acute distress.    Appearance: Normal appearance. He is not ill-appearing.  HENT:     Head: Normocephalic and atraumatic.  Eyes:     Pupils: Pupils are equal, round, and  reactive to light.  Cardiovascular:     Rate and Rhythm: Normal rate.  Pulmonary:     Effort: Pulmonary effort  is normal.  Musculoskeletal:     Thoracic back: No bony tenderness.     Lumbar back: No bony tenderness.       Back:     Comments: Tender to palpation to left lower flank area.  No bruising or swelling.  Skin:    General: Skin is warm and dry.  Neurological:     General: No focal deficit present.     Mental Status: He is alert and oriented to person, place, and time.  Psychiatric:        Mood and Affect: Mood normal.        Behavior: Behavior normal.      UC Treatments / Results  Labs (all labs ordered are listed, but only abnormal results are displayed) Labs Reviewed  POCT URINE DIPSTICK - Abnormal; Notable for the following components:      Result Value   POC PROTEIN,UA =30 (*)    All other components within normal limits   Basic metabolic panel with GFR Order: [id number]  Status: Final result     Next appt: None     Dx: Other primary thrombophilia; Hyperten...   Test Result Released: Yes (seen)     Messages: Seen   0 Result Notes     1 Patient Communication     View Follow-Up Encounter          Component Ref Range & Units (hover) 5 mo ago (02/09/24) 1 yr ago (06/10/23) 1 yr ago (12/05/22) 3 yr ago (05/02/21) 4 yr ago (05/10/20) 4 yr ago (10/26/19) 5 yr ago (03/26/19)  Sodium 138 139 141 138 139 138 R 134 Low  R  Potassium 4.0 4.1 4.2 4.6 4.2 4.7 R 4.2 R  Chloride 100 102 102 102 103 101 R 100 R  CO2 30 32 30 31 32 25 R 25 R  Glucose, Bld 64 Low  82 73 75 78 93 R 109 High   BUN 15 16 19 18 16 13  R 20 R  Creatinine, Ser 0.96 1.02 1.30 0.94 0.97 1.01 R 1.07 R  GFR 79.21 74.00 CM 55.51 Low  CM 82.83 CM 80.32 CM    Comment: Calculated using the CKD-EPI Creatinine Equation (2021)  Calcium  8.7 9.0 9.1 8.7 8.8 9.2 R 8.0 Low  R  Resulting Agency [place] [facility] [place] [facility] [place] [facility] [place] [facility] [place] [facility]         Specimen Collected: 02/09/24 09:07 Last Resulted: 02/09/24 11:12      EKG   Radiology No results found.  Procedures Procedures (including critical care time)  Medications Ordered in UC Medications - No data to display  Initial Impression / Assessment and Plan / UC Course  I have reviewed the triage vital signs and the nursing notes.  Pertinent labs & imaging results that were available during my care of the patient were reviewed by me and considered in my medical decision making (see chart for details).     Reviewed exam and symptoms with patient.  UA negative for UTI and no blood.  Discussed likely musculoskeletal cause of symptoms.  He declined Toradol  injection.  He has tramadol  at home that he can take also discussed heat and rest.  PCP follow-up 2 to 3 days for recheck.  ER precautions reviewed. Final Clinical Impressions(s) / UC Diagnoses   Final diagnoses:  Left flank pain  Mid back pain on left side     Discharge Instructions      Your urine is negative  for infection or blood.  Continue increasing your fluid intake and you may continue the tramadol  that you are previously prescribed.  We also do heat and rest to the area.  Follow-up with your PCP in 2 to 3 days for recheck.  Please go to the ER if you develop any worsening symptoms.  Hope you feel better soon!     ED Prescriptions   None    PDMP not reviewed this encounter.     [1]  Social History Tobacco Use   Smoking status: Former    Current packs/day: 0.00    Types: Cigarettes    Quit date: 04/27/2009    Years since quitting: 15.2   Smokeless tobacco: Never  Vaping Use   Vaping status: Never Used  Substance Use Topics   Alcohol use: No   Drug use: No     Loreda Myla SAUNDERS, NP 07/15/24 1159  "

## 2024-07-15 NOTE — ED Triage Notes (Addendum)
 Pt reports L-sided back and flank pain x3 days. Describes sharp pain when he is up walking - at rest it just feels like pressure in that area. Denies dysuria and frequency. Took a tramadol  for pain this morning. Hx of kidney stones. Notes last episode of kidney stones ~ 35yr ago on R side.

## 2024-07-15 NOTE — Discharge Instructions (Addendum)
 Your urine is negative for infection or blood.  Continue increasing your fluid intake and you may continue the tramadol  that you are previously prescribed.  We also do heat and rest to the area.  Follow-up with your PCP in 2 to 3 days for recheck.  Please go to the ER if you develop any worsening symptoms.  Hope you feel better soon!

## 2024-07-23 ENCOUNTER — Other Ambulatory Visit: Payer: Self-pay | Admitting: Internal Medicine

## 2024-07-23 DIAGNOSIS — I2510 Atherosclerotic heart disease of native coronary artery without angina pectoris: Secondary | ICD-10-CM

## 2024-07-23 DIAGNOSIS — E785 Hyperlipidemia, unspecified: Secondary | ICD-10-CM
